# Patient Record
Sex: Female | Born: 1965 | Race: Black or African American | Hispanic: No | Marital: Married | Smoking: Never smoker
Health system: Southern US, Community
[De-identification: ages and names within clinical notes are randomized; demographics above are authoritative.]

## PROBLEM LIST (undated history)

## (undated) DIAGNOSIS — B373 Candidiasis of vulva and vagina: Secondary | ICD-10-CM

## (undated) DIAGNOSIS — O009 Unspecified ectopic pregnancy without intrauterine pregnancy: Secondary | ICD-10-CM

## (undated) DIAGNOSIS — O09529 Supervision of elderly multigravida, unspecified trimester: Secondary | ICD-10-CM

## (undated) DIAGNOSIS — E785 Hyperlipidemia, unspecified: Secondary | ICD-10-CM

## (undated) DIAGNOSIS — Z880 Allergy status to penicillin: Secondary | ICD-10-CM

## (undated) DIAGNOSIS — N921 Excessive and frequent menstruation with irregular cycle: Secondary | ICD-10-CM

## (undated) DIAGNOSIS — E119 Type 2 diabetes mellitus without complications: Secondary | ICD-10-CM

## (undated) DIAGNOSIS — I10 Essential (primary) hypertension: Secondary | ICD-10-CM

## (undated) DIAGNOSIS — N200 Calculus of kidney: Secondary | ICD-10-CM

## (undated) DIAGNOSIS — E669 Obesity, unspecified: Secondary | ICD-10-CM

## (undated) HISTORY — DX: Type 2 diabetes mellitus without complications: E11.9

## (undated) HISTORY — DX: Candidiasis of vulva and vagina: B37.3

## (undated) HISTORY — DX: Unspecified ectopic pregnancy without intrauterine pregnancy: O00.90

## (undated) HISTORY — DX: Obesity, unspecified: E66.9

## (undated) HISTORY — DX: Excessive and frequent menstruation with irregular cycle: N92.1

## (undated) HISTORY — DX: Calculus of kidney: N20.0

## (undated) HISTORY — DX: Supervision of elderly multigravida, unspecified trimester: O09.529

## (undated) HISTORY — DX: Allergy status to penicillin: Z88.0

## (undated) HISTORY — DX: Hyperlipidemia, unspecified: E78.5

---

## 2002-10-09 HISTORY — PX: TUBAL LIGATION: SHX77

## 2002-11-04 ENCOUNTER — Ambulatory Visit (HOSPITAL_COMMUNITY): Admission: RE | Admit: 2002-11-04 | Discharge: 2002-11-04 | Payer: Self-pay | Admitting: Obstetrics and Gynecology

## 2002-11-04 ENCOUNTER — Encounter (INDEPENDENT_AMBULATORY_CARE_PROVIDER_SITE_OTHER): Payer: Self-pay

## 2002-11-04 ENCOUNTER — Encounter: Payer: Self-pay | Admitting: Obstetrics and Gynecology

## 2002-11-04 DIAGNOSIS — Z880 Allergy status to penicillin: Secondary | ICD-10-CM

## 2002-11-04 HISTORY — DX: Allergy status to penicillin: Z88.0

## 2003-12-17 ENCOUNTER — Other Ambulatory Visit: Admission: RE | Admit: 2003-12-17 | Discharge: 2003-12-17 | Payer: Self-pay | Admitting: Obstetrics and Gynecology

## 2003-12-22 ENCOUNTER — Encounter: Admission: RE | Admit: 2003-12-22 | Discharge: 2003-12-22 | Payer: Self-pay | Admitting: Obstetrics and Gynecology

## 2004-07-14 ENCOUNTER — Ambulatory Visit (HOSPITAL_COMMUNITY): Admission: RE | Admit: 2004-07-14 | Discharge: 2004-07-14 | Payer: Self-pay | Admitting: Family Medicine

## 2005-06-29 ENCOUNTER — Other Ambulatory Visit: Admission: RE | Admit: 2005-06-29 | Discharge: 2005-06-29 | Payer: Self-pay | Admitting: Obstetrics and Gynecology

## 2005-07-14 ENCOUNTER — Encounter: Admission: RE | Admit: 2005-07-14 | Discharge: 2005-07-14 | Payer: Self-pay | Admitting: Obstetrics and Gynecology

## 2006-07-09 DIAGNOSIS — B3731 Acute candidiasis of vulva and vagina: Secondary | ICD-10-CM

## 2006-07-09 DIAGNOSIS — B373 Candidiasis of vulva and vagina: Secondary | ICD-10-CM

## 2006-07-09 HISTORY — DX: Candidiasis of vulva and vagina: B37.3

## 2006-07-09 HISTORY — DX: Acute candidiasis of vulva and vagina: B37.31

## 2006-10-11 ENCOUNTER — Inpatient Hospital Stay (HOSPITAL_COMMUNITY): Admission: AD | Admit: 2006-10-11 | Discharge: 2006-10-11 | Payer: Self-pay | Admitting: Obstetrics and Gynecology

## 2006-10-20 ENCOUNTER — Inpatient Hospital Stay (HOSPITAL_COMMUNITY): Admission: AD | Admit: 2006-10-20 | Discharge: 2006-10-20 | Payer: Self-pay | Admitting: Obstetrics and Gynecology

## 2006-11-13 ENCOUNTER — Inpatient Hospital Stay (HOSPITAL_COMMUNITY): Admission: AD | Admit: 2006-11-13 | Discharge: 2006-11-13 | Payer: Self-pay | Admitting: Obstetrics and Gynecology

## 2006-11-26 ENCOUNTER — Encounter: Admission: RE | Admit: 2006-11-26 | Discharge: 2006-12-23 | Payer: Self-pay | Admitting: Obstetrics and Gynecology

## 2006-11-28 ENCOUNTER — Inpatient Hospital Stay (HOSPITAL_COMMUNITY): Admission: AD | Admit: 2006-11-28 | Discharge: 2006-11-28 | Payer: Self-pay | Admitting: Obstetrics and Gynecology

## 2006-12-10 ENCOUNTER — Ambulatory Visit (HOSPITAL_COMMUNITY): Admission: RE | Admit: 2006-12-10 | Discharge: 2006-12-10 | Payer: Self-pay | Admitting: Obstetrics and Gynecology

## 2006-12-24 ENCOUNTER — Inpatient Hospital Stay (HOSPITAL_COMMUNITY): Admission: AD | Admit: 2006-12-24 | Discharge: 2006-12-27 | Payer: Self-pay | Admitting: Obstetrics and Gynecology

## 2006-12-24 ENCOUNTER — Encounter (INDEPENDENT_AMBULATORY_CARE_PROVIDER_SITE_OTHER): Payer: Self-pay | Admitting: *Deleted

## 2006-12-25 ENCOUNTER — Encounter (INDEPENDENT_AMBULATORY_CARE_PROVIDER_SITE_OTHER): Payer: Self-pay | Admitting: *Deleted

## 2006-12-31 ENCOUNTER — Encounter: Admission: RE | Admit: 2006-12-31 | Discharge: 2007-01-30 | Payer: Self-pay | Admitting: Obstetrics and Gynecology

## 2007-01-31 ENCOUNTER — Encounter: Admission: RE | Admit: 2007-01-31 | Discharge: 2007-03-01 | Payer: Self-pay | Admitting: Obstetrics and Gynecology

## 2007-03-02 ENCOUNTER — Encounter: Admission: RE | Admit: 2007-03-02 | Discharge: 2007-04-01 | Payer: Self-pay | Admitting: Obstetrics and Gynecology

## 2007-04-02 ENCOUNTER — Encounter: Admission: RE | Admit: 2007-04-02 | Discharge: 2007-05-01 | Payer: Self-pay | Admitting: Obstetrics and Gynecology

## 2007-05-02 ENCOUNTER — Encounter: Admission: RE | Admit: 2007-05-02 | Discharge: 2007-06-01 | Payer: Self-pay | Admitting: Obstetrics and Gynecology

## 2007-06-02 ENCOUNTER — Encounter: Admission: RE | Admit: 2007-06-02 | Discharge: 2007-07-02 | Payer: Self-pay | Admitting: Obstetrics and Gynecology

## 2007-06-07 ENCOUNTER — Encounter: Admission: RE | Admit: 2007-06-07 | Discharge: 2007-06-07 | Payer: Self-pay | Admitting: Obstetrics and Gynecology

## 2007-07-03 ENCOUNTER — Encounter: Admission: RE | Admit: 2007-07-03 | Discharge: 2007-08-01 | Payer: Self-pay | Admitting: Obstetrics and Gynecology

## 2007-08-02 ENCOUNTER — Encounter: Admission: RE | Admit: 2007-08-02 | Discharge: 2007-09-01 | Payer: Self-pay | Admitting: Obstetrics and Gynecology

## 2007-09-02 ENCOUNTER — Encounter: Admission: RE | Admit: 2007-09-02 | Discharge: 2007-10-01 | Payer: Self-pay | Admitting: Obstetrics and Gynecology

## 2007-10-02 ENCOUNTER — Encounter: Admission: RE | Admit: 2007-10-02 | Discharge: 2007-11-01 | Payer: Self-pay | Admitting: Obstetrics and Gynecology

## 2007-11-02 ENCOUNTER — Encounter: Admission: RE | Admit: 2007-11-02 | Discharge: 2007-12-02 | Payer: Self-pay | Admitting: Obstetrics and Gynecology

## 2007-11-02 IMAGING — US US AMNIOCENTESIS
1 series · 3 of 3 positions shown · non-contrast
Comparison: none

CLINICAL DATA: Assess fetal lung maturity.  
 ULTRASOUND-GUIDED AMNIOCENTESIS:
 Ultrasound was utilized to perform amniocentesis by the requesting physician.

[Series 1: us amniocentesis · 3 of 3 slices shown]
[im 1/3]
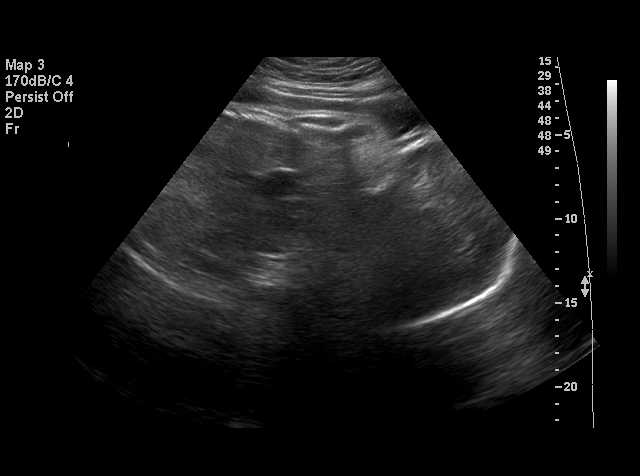
[im 2/3]
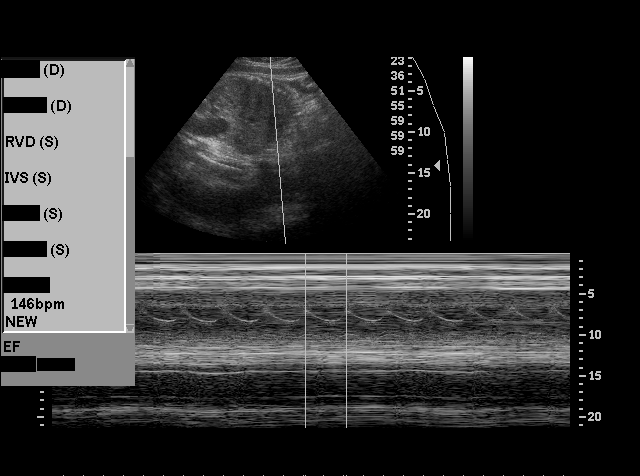
[im 3/3]
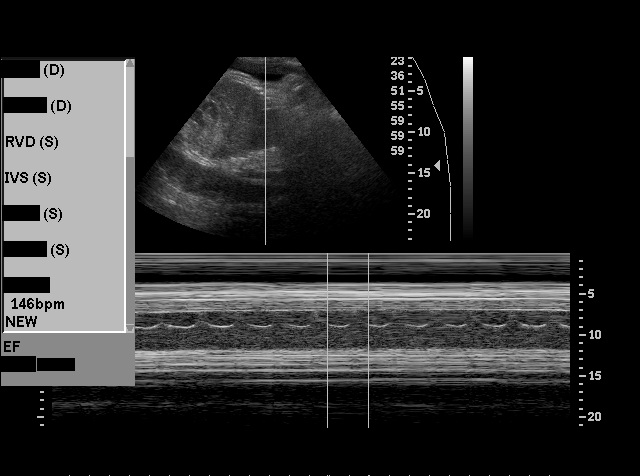

[3 of 3 positions shown; findings below may reference images not displayed]

IMPRESSION: No radiographic interpretation.

## 2007-11-02 IMAGING — US US FETAL BPP W/O NONSTRESS
1 series · 9 of 9 positions shown · non-contrast
Comparison: none

OBSTETRICAL ULTRASOUND:

 This ultrasound exam was performed in the [HOSPITAL] Ultrasound Department.  The OB US report was generated in the AS system, and faxed to the ordering physician.  This report is also available in [REDACTED] PACS.

[Series 1: us fetal bpp w/o nonstress · non-contrast · 9 of 9 slices shown]
[im 1/9]
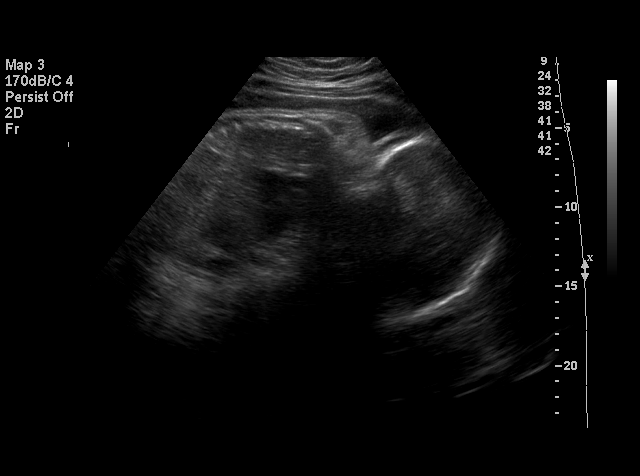
[im 2/9]
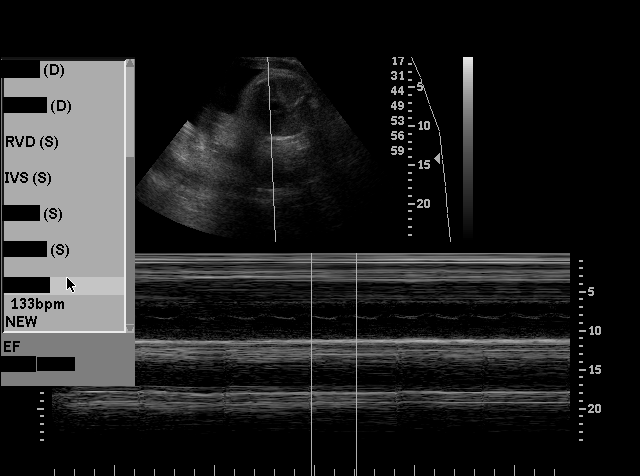
[im 3/9]
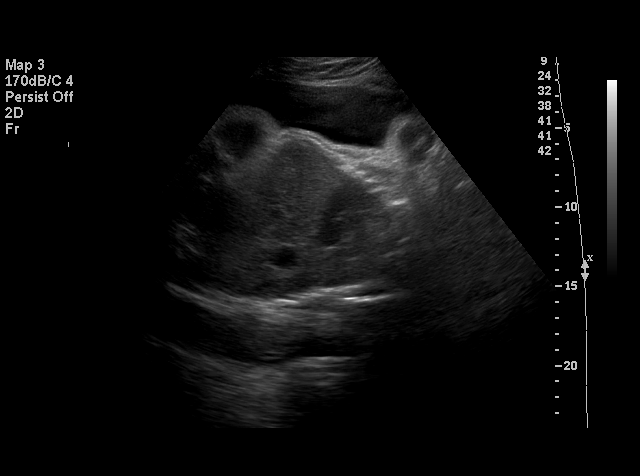
[im 4/9]
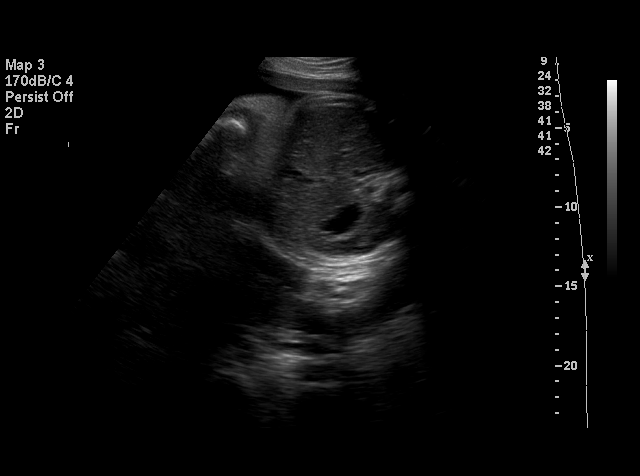
[im 5/9]
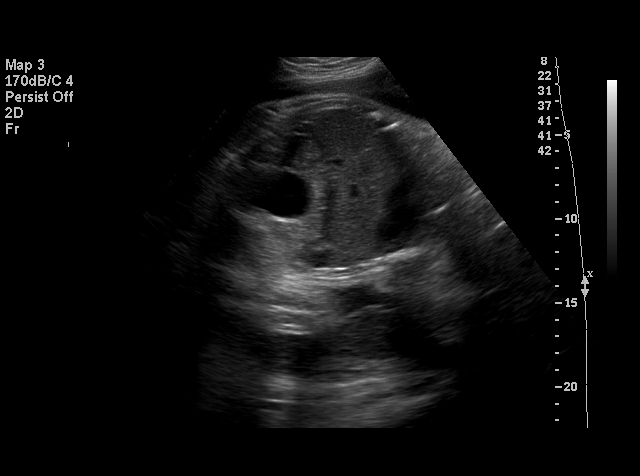
[im 6/9]
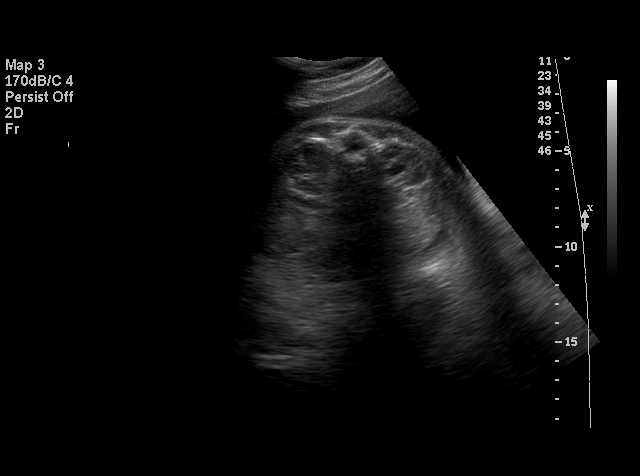
[im 7/9]
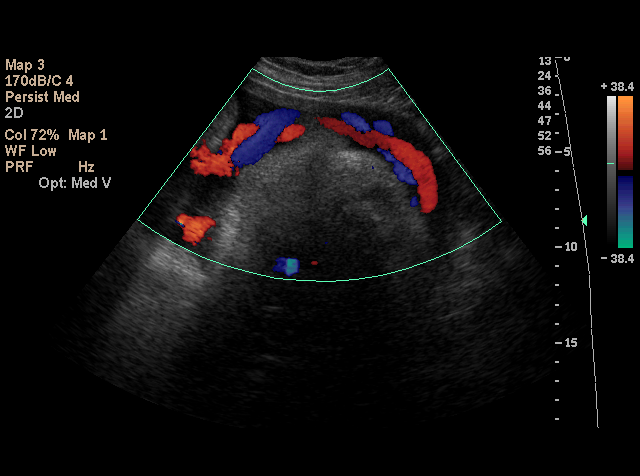
[im 8/9]
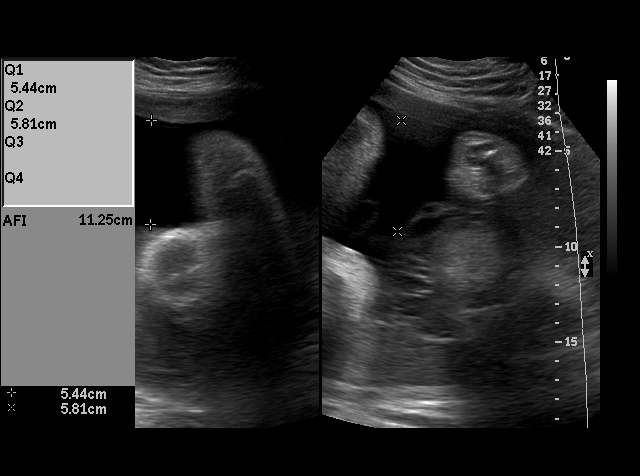
[im 9/9]
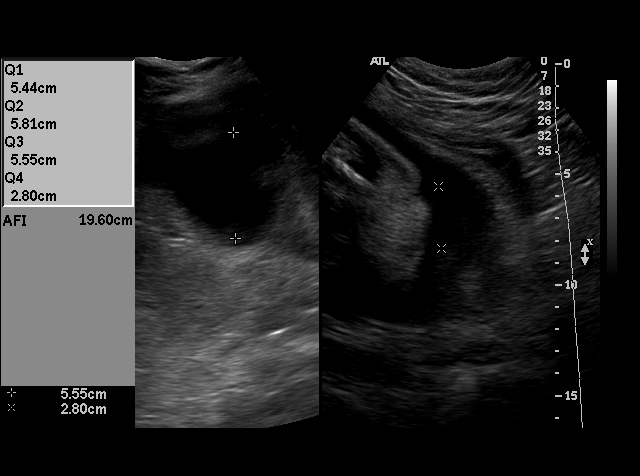

[9 of 9 positions shown; findings below may reference images not displayed]

IMPRESSION: See AS Obstetric US report.

## 2007-12-03 ENCOUNTER — Encounter: Admission: RE | Admit: 2007-12-03 | Discharge: 2007-12-30 | Payer: Self-pay | Admitting: Obstetrics and Gynecology

## 2007-12-31 ENCOUNTER — Encounter: Admission: RE | Admit: 2007-12-31 | Discharge: 2008-01-30 | Payer: Self-pay | Admitting: Obstetrics and Gynecology

## 2008-01-31 ENCOUNTER — Encounter: Admission: RE | Admit: 2008-01-31 | Discharge: 2008-02-29 | Payer: Self-pay | Admitting: Obstetrics and Gynecology

## 2008-03-01 ENCOUNTER — Encounter: Admission: RE | Admit: 2008-03-01 | Discharge: 2008-03-31 | Payer: Self-pay | Admitting: Obstetrics and Gynecology

## 2008-04-01 ENCOUNTER — Encounter: Admission: RE | Admit: 2008-04-01 | Discharge: 2008-04-30 | Payer: Self-pay | Admitting: Obstetrics and Gynecology

## 2008-05-01 ENCOUNTER — Encounter: Admission: RE | Admit: 2008-05-01 | Discharge: 2008-05-31 | Payer: Self-pay | Admitting: Obstetrics and Gynecology

## 2008-07-13 ENCOUNTER — Encounter: Admission: RE | Admit: 2008-07-13 | Discharge: 2008-07-13 | Payer: Self-pay | Admitting: Obstetrics and Gynecology

## 2008-10-09 LAB — CONVERTED CEMR LAB

## 2009-08-05 ENCOUNTER — Encounter: Admission: RE | Admit: 2009-08-05 | Discharge: 2009-08-05 | Payer: Self-pay | Admitting: Obstetrics and Gynecology

## 2009-08-05 LAB — HM MAMMOGRAPHY

## 2009-10-20 ENCOUNTER — Ambulatory Visit: Payer: Self-pay | Admitting: Internal Medicine

## 2009-10-20 DIAGNOSIS — R209 Unspecified disturbances of skin sensation: Secondary | ICD-10-CM | POA: Insufficient documentation

## 2009-10-26 ENCOUNTER — Ambulatory Visit: Payer: Self-pay | Admitting: Cardiology

## 2010-04-19 ENCOUNTER — Ambulatory Visit: Payer: Self-pay | Admitting: Internal Medicine

## 2010-08-02 ENCOUNTER — Ambulatory Visit: Payer: Self-pay | Admitting: Internal Medicine

## 2010-09-23 ENCOUNTER — Ambulatory Visit: Payer: Self-pay | Admitting: Internal Medicine

## 2010-09-27 LAB — CONVERTED CEMR LAB
ALT: 18 units/L (ref 0–35)
AST: 19 units/L (ref 0–37)
Albumin: 3.5 g/dL (ref 3.5–5.2)
Alkaline Phosphatase: 45 units/L (ref 39–117)
BUN: 9 mg/dL (ref 6–23)
Basophils Absolute: 0 10*3/uL (ref 0.0–0.1)
Basophils Relative: 0.4 % (ref 0.0–3.0)
Bilirubin Urine: NEGATIVE
Bilirubin, Direct: 0.1 mg/dL (ref 0.0–0.3)
CO2: 28 meq/L (ref 19–32)
Calcium: 8.8 mg/dL (ref 8.4–10.5)
Chloride: 108 meq/L (ref 96–112)
Cholesterol: 210 mg/dL — ABNORMAL HIGH (ref 0–200)
Creatinine, Ser: 0.9 mg/dL (ref 0.4–1.2)
Direct LDL: 138.3 mg/dL
Eosinophils Absolute: 0.1 10*3/uL (ref 0.0–0.7)
Eosinophils Relative: 1.9 % (ref 0.0–5.0)
GFR calc non Af Amer: 88.9 mL/min (ref >60.00)
Glucose, Bld: 87 mg/dL (ref 70–99)
HCT: 41.6 % (ref 36.0–46.0)
HDL: 52 mg/dL (ref >39.00)
Hemoglobin: 14 g/dL (ref 12.0–15.0)
Ketones, ur: NEGATIVE mg/dL
Leukocytes, UA: NEGATIVE
Lymphocytes Relative: 30.5 % (ref 12.0–46.0)
Lymphs Abs: 1.9 10*3/uL (ref 0.7–4.0)
MCHC: 33.5 g/dL (ref 30.0–36.0)
MCV: 89 fL (ref 78.0–100.0)
Monocytes Absolute: 0.6 10*3/uL (ref 0.1–1.0)
Monocytes Relative: 8.9 % (ref 3.0–12.0)
Neutro Abs: 3.6 10*3/uL (ref 1.4–7.7)
Neutrophils Relative %: 58.3 % (ref 43.0–77.0)
Nitrite: NEGATIVE
Platelets: 297 10*3/uL (ref 150.0–400.0)
Potassium: 4.8 meq/L (ref 3.5–5.1)
RBC: 4.68 M/uL (ref 3.87–5.11)
RDW: 13.9 % (ref 11.5–14.6)
Sodium: 140 meq/L (ref 135–145)
Specific Gravity, Urine: >=1.03 (ref 1.000–1.030)
TSH: 1.92 microintl units/mL (ref 0.35–5.50)
Total Bilirubin: 0.6 mg/dL (ref 0.3–1.2)
Total CHOL/HDL Ratio: 4
Total Protein, Urine: NEGATIVE mg/dL
Total Protein: 6.6 g/dL (ref 6.0–8.3)
Triglycerides: 43 mg/dL (ref 0.0–149.0)
Urine Glucose: NEGATIVE mg/dL
Urobilinogen, UA: 1 (ref 0.0–1.0)
VLDL: 8.6 mg/dL (ref 0.0–40.0)
WBC: 6.3 10*3/uL (ref 4.5–10.5)
pH: 6 (ref 5.0–8.0)

## 2010-09-28 ENCOUNTER — Encounter: Payer: Self-pay | Admitting: Internal Medicine

## 2010-09-28 DIAGNOSIS — E669 Obesity, unspecified: Secondary | ICD-10-CM | POA: Insufficient documentation

## 2010-09-30 ENCOUNTER — Ambulatory Visit: Payer: Self-pay | Admitting: Internal Medicine

## 2010-10-12 ENCOUNTER — Encounter
Admission: RE | Admit: 2010-10-12 | Discharge: 2010-10-12 | Payer: Self-pay | Source: Home / Self Care | Attending: Obstetrics and Gynecology | Admitting: Obstetrics and Gynecology

## 2010-10-30 ENCOUNTER — Encounter: Payer: Self-pay | Admitting: Obstetrics and Gynecology

## 2010-10-31 ENCOUNTER — Ambulatory Visit
Admission: RE | Admit: 2010-10-31 | Discharge: 2010-10-31 | Payer: Self-pay | Source: Home / Self Care | Attending: Internal Medicine | Admitting: Internal Medicine

## 2010-11-06 LAB — CONVERTED CEMR LAB
ALT: 19 units/L (ref 0–35)
AST: 19 units/L (ref 0–37)
Albumin: 3.5 g/dL (ref 3.5–5.2)
Alkaline Phosphatase: 55 units/L (ref 39–117)
BUN: 6 mg/dL (ref 6–23)
Basophils Absolute: 0.4 10*3/uL — ABNORMAL HIGH (ref 0.0–0.1)
Basophils Relative: 4 % — ABNORMAL HIGH (ref 0.0–3.0)
Bilirubin, Direct: 0.1 mg/dL (ref 0.0–0.3)
CO2: 27 meq/L (ref 19–32)
Calcium: 9.1 mg/dL (ref 8.4–10.5)
Chloride: 105 meq/L (ref 96–112)
Cholesterol: 217 mg/dL — ABNORMAL HIGH (ref 0–200)
Creatinine, Ser: 0.9 mg/dL (ref 0.4–1.2)
Direct LDL: 140.4 mg/dL
Eosinophils Absolute: 0.1 10*3/uL (ref 0.0–0.7)
Eosinophils Relative: 1.6 % (ref 0.0–5.0)
Folate: 16.3 ng/mL
GFR calc non Af Amer: 86.99 mL/min (ref >60)
Glucose, Bld: 109 mg/dL — ABNORMAL HIGH (ref 70–99)
HCT: 42.3 % (ref 36.0–46.0)
HDL: 56.1 mg/dL (ref >39.00)
Hemoglobin: 14.2 g/dL (ref 12.0–15.0)
Lymphocytes Relative: 24.3 % (ref 12.0–46.0)
Lymphs Abs: 2.2 10*3/uL (ref 0.7–4.0)
MCHC: 33.5 g/dL (ref 30.0–36.0)
MCV: 89.4 fL (ref 78.0–100.0)
Monocytes Absolute: 0.7 10*3/uL (ref 0.1–1.0)
Monocytes Relative: 7.2 % (ref 3.0–12.0)
Neutro Abs: 5.7 10*3/uL (ref 1.4–7.7)
Neutrophils Relative %: 62.9 % (ref 43.0–77.0)
Platelets: 239 10*3/uL (ref 150.0–400.0)
Potassium: 4.1 meq/L (ref 3.5–5.1)
RBC: 4.73 M/uL (ref 3.87–5.11)
RDW: 13.3 % (ref 11.5–14.6)
Sodium: 138 meq/L (ref 135–145)
TSH: 2.25 microintl units/mL (ref 0.35–5.50)
Total Bilirubin: 0.5 mg/dL (ref 0.3–1.2)
Total CHOL/HDL Ratio: 4
Total Protein: 7.1 g/dL (ref 6.0–8.3)
Triglycerides: 129 mg/dL (ref 0.0–149.0)
VLDL: 25.8 mg/dL (ref 0.0–40.0)
Vitamin B-12: 675 pg/mL (ref 211–911)
WBC: 9.1 10*3/uL (ref 4.5–10.5)

## 2010-11-10 NOTE — Assessment & Plan Note (Signed)
Summary: 1 MTH FU  STC   Vital Signs:  Patient profile:   45 year old female Height:      64 inches (162.56 cm) Weight:      220.50 pounds (100.23 kg) BMI:     37.99 O2 Sat:      98 % on Room air Temp:     97.9 degrees F (36.61 degrees C) oral Pulse rate:   69 / minute BP sitting:   102 / 78  (left arm) Cuff size:   large  Vitals Entered By: Brenton Grills CMA (AAMA) (October 31, 2010 10:30 AM)  O2 Flow:  Room air CC: 1 month F/U/aj Is Patient Diabetic? No   Primary Care Provider:  Rene Paci, MD  CC:  1 month F/U/aj.  History of Present Illness: here for f/u  obesity trial phentermine rx med for appetite suppression provided 09/28/10 working on inc exercise effort and diet changes -   Clinical Review Panels:  Lipid Management   Cholesterol:  210 (09/27/2010)   HDL (good cholesterol):  52.00 (09/27/2010)  CBC   WBC:  6.3 (09/27/2010)   RBC:  4.68 (09/27/2010)   Hgb:  14.0 (09/27/2010)   Hct:  41.6 (09/27/2010)   Platelets:  297.0 (09/27/2010)   MCV  89.0 (09/27/2010)   MCHC  33.5 (09/27/2010)   RDW  13.9 (09/27/2010)   PMN:  58.3 (09/27/2010)   Lymphs:  30.5 (09/27/2010)   Monos:  8.9 (09/27/2010)   Eosinophils:  1.9 (09/27/2010)   Basophil:  0.4 (09/27/2010)  Complete Metabolic Panel   Glucose:  87 (09/27/2010)   Sodium:  140 (09/27/2010)   Potassium:  4.8 (09/27/2010)   Chloride:  108 (09/27/2010)   CO2:  28 (09/27/2010)   BUN:  9 (09/27/2010)   Creatinine:  0.9 (09/27/2010)   Albumin:  3.5 (09/27/2010)   Total Protein:  6.6 (09/27/2010)   Calcium:  8.8 (09/27/2010)   Total Bili:  0.6 (09/27/2010)   Alk Phos:  45 (09/27/2010)   SGPT (ALT):  18 (09/27/2010)   SGOT (AST):  19 (09/27/2010)   Current Medications (verified): 1)  Phentermine Hcl 37.5 Mg Caps (Phentermine Hcl) .Marland Kitchen.. 1 By Mouth Once Daily  Allergies (verified): No Known Drug Allergies  Past History:  Past Medical History: Vit D defic obesity - previously followed with  bariatric clinic   MD roster - gyn - Su Hilt  Review of Systems  The patient denies chest pain, syncope, headaches, and abdominal pain.    Physical Exam  General:  alert, well-developed, well-nourished, and cooperative to examination.   overweight-appearing.   Lungs:  normal respiratory effort, no intercostal retractions or use of accessory muscles; normal breath sounds bilaterally - no crackles and no wheezes.    Heart:  normal rate, regular rhythm, no murmur, and no rub. BLE without edema. Psych:  Oriented X3, memory intact for recent and remote, normally interactive, good eye contact, not anxious appearing, not depressed appearing, and not agitated.      Impression & Recommendations:  Problem # 1:  OBESITY (ICD-278.00)  education of diet and exercsie for weight contol to improve health - will use phenteremine x 30d for appetite suppression - rx provided - 2 of 3 mo provided today - little loss in 1st 30d reviewed reweigh 1 mo and review med tolerance - max 3 mo use even if successful weight loss -  pt understands and agrees to same Ht: 64 (09/28/2010)   Wt: 221.12 (09/28/2010)   BMI: 38.09 (09/28/2010)  Ht: 64 (10/31/2010)   Wt: 220.50 (10/31/2010)   BMI: 37.99 (10/31/2010)  Complete Medication List: 1)  Phentermine Hcl 37.5 Mg Caps (Phentermine hcl) .Marland Kitchen.. 1 by mouth once daily  Patient Instructions: 1)  it was good to see you today. 2)  exam good and 09/2010 labs reviewed - thyroid looks normal 3)  ok to continue phentermine as discussed for appetitie suppression - will need recheck in 1 month before refills provided as discussed to ensure tolerance of medication and weight loss - call if problems 4)  2 of 3 months planned provided today, max 3 months treatment each year 5)  Please schedule a follow-up appointment in 1 month if planning to continue this medication, call sooner if problems. Prescriptions: PHENTERMINE HCL 37.5 MG CAPS (PHENTERMINE HCL) 1 by mouth once daily   #30 x 0   Entered and Authorized by:   Newt Lukes MD   Signed by:   Newt Lukes MD on 10/31/2010   Method used:   Print then Give to Patient   RxID:   [id number]    Orders Added: 1)  Est. Patient Level III [14782]

## 2010-11-10 NOTE — Assessment & Plan Note (Signed)
Summary: NEW [employer] PT--PKG--L LEG FEEL NUMB WHEN SIT DOWN-STC   Vital Signs:  Patient profile:   45 year old female Height:      64 inches (162.56 cm) Weight:      226.38 pounds (102.90 kg) BMI:     39.00 O2 Sat:      99 % on Room air Temp:     96.8 degrees F (36 degrees C) oral Pulse rate:   68 / minute BP sitting:   122 / 78  (left arm) Cuff size:   large  Vitals Entered By: Josph Macho CMA (October 20, 2009 2:01 PM)  O2 Flow:  Room air CC: New pt: Left leg numb when sitting/ TD today/ CF   Primary Care Provider:  Rene Paci, MD  CC:  New pt: Left leg numb when sitting/ TD today/ CF.  History of Present Illness: new pt to me, here to est care -  patient is here today for annual physical. Patient feels well.   also c/o L sided "pain" and numbness onset 1 month ago - located on lateral leg below knee as well as lateral side of left arm distal to elbow - no HA or vision changes - no muscle weakness or falls but +overwhelming fatigue and weight gain - numb symptoms worst when down on her knees (playing on floor with 47yr old) no difficulty with speech, no sz hx - no rash or itching symptoms are constantly present but wax/wane with intensity from mild to very noticable  Preventive Screening-Counseling & Management  Alcohol-Tobacco     Alcohol drinks/day: <1     Alcohol Counseling: not indicated; use of alcohol is not excessive or problematic     Smoking Status: never     Tobacco Counseling: not indicated; no tobacco use  Caffeine-Diet-Exercise     Diet Counseling: to improve diet; diet is suboptimal     Does Patient Exercise: no     Exercise Counseling: to improve exercise regimen     Depression Counseling: not indicated; screening negative for depression  Safety-Violence-Falls     Seat Belt Use: yes     Firearms in the Home: no firearms in the home     Smoke Detectors: yes     Violence in the Home: no risk noted     Fall Risk Counseling: not indicated;  no significant falls noted  Clinical Review Panels:  Prevention   Last Mammogram:  historical (08/05/2009)   Last Pap Smear:  historical (08/03/2009)  Immunizations   Last Tetanus Booster:  Tdap (10/20/2009)   Current Medications (verified): 1)  None  Allergies (verified): No Known Drug Allergies  Past History:  Past Medical History: Vit D defic obesity - previously followed with bariatric clinic  MD [facility] - gyn Su Hilt  Past Surgical History: s/p tubal 2004 d/t eptopic  Family History: mom - age 76 - HTN dad - age 17 - HTN sister - DM  Social History: divorced - lives with 3 childern , mom and g-mom Production designer, 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 I have reviewed all other systems and they were negative.   Physical Exam  General:  alert, well-developed, well-nourished, and cooperative to examination.    Eyes:  vision grossly intact; pupils equal, round and reactive to light.  conjunctiva and lids normal.    Ears:  normal pinnae bilaterally, without  erythema, swelling, or tenderness to palpation. TMs clear, without effusion, or cerumen impaction. Hearing grossly normal bilaterally  Mouth:  teeth and gums in good repair; mucous membranes moist, without lesions or ulcers. oropharynx clear without exudate, no erythema.  Lungs:  normal respiratory effort, no intercostal retractions or use of accessory muscles; normal breath sounds bilaterally - no crackles and no wheezes.    Heart:  normal rate, regular rhythm, no murmur, and no rub. BLE without edema. normal DP pulses and normal cap refill in all 4 extremities    Abdomen:  soft, non-tender, normal bowel sounds, no distention; no masses and no appreciable hepatomegaly or splenomegaly.   Genitalia:  defer to gyn Msk:  No deformity or scoliosis noted of thoracic or lumbar spine.  no effusions to knees, FROM at hip, kneee and ankle  BLE back: full range of motion of lumbar spine. Nontender to palpation. Negative straight leg raise. Deep tendon reflexes symmetrically intact at Achilles and patella, negative clonus. Sensation intact throughout all dermatomes in bilateral lower extremities. Full strength to manual muscle testing in all major muscule groups including EHL, anterior tibialis, gastrocnemius, quadriceps, and iliopsoas. Able to heel and toe walk without difficulty and ambulates with a normal gait.  Neurologic:  alert & oriented X3 and cranial nerves II-XII symetrically intact.  strength normal in all extremities, sensation intact to light touch, and gait normal. speech fluent without dysarthria or aphasia; follows commands with good comprehension.  Skin:  no rashes, vesicles, ulcers, or erythema. No nodules or irregularity to palpation.  Psych:  Oriented X3, memory intact for recent and remote, normally interactive, good eye contact, not anxious appearing, not depressed appearing, and not agitated.      Impression & Recommendations:  Problem # 1:  PREVENTIVE HEALTH CARE (ICD-V70.0)  Patient has been counseled on age-appropriate routine health concerns for screening and prevention. These are reviewed and up-to-date. Immunizations are up-to-date or declined. Labs ordered and ECG reviewed.   Orders: TLB-Lipid Panel (80061-LIPID) TLB-BMP (Basic Metabolic Panel-BMET) (80048-METABOL) TLB-CBC Platelet - w/Differential (85025-CBCD) TLB-Hepatic/Liver Function Pnl (80076-HEPATIC) TLB-TSH (Thyroid Stimulating Hormone) (84443-TSH) EKG w/ Interpretation (93000)  Problem # 2:  NUMBNESS (ICD-782.0)  likely cutaneous impingment -  r/o metabolic dz like Thyroid, DM or B12 deifc contrib also head ct r/o mass given unilateral symptoms on left -  consider MRI vs reassurance and observation depending on symptoms and test results  Orders: TLB-BMP (Basic Metabolic Panel-BMET) (80048-METABOL) TLB-TSH (Thyroid Stimulating Hormone)  (84443-TSH) TLB-B12 + Folate Pnl (16109_60454-U98/JXB) Misc. Referral (Misc. Ref)  Other Orders: Tdap => 61yrs IM (14782) Admin 1st Vaccine (95621)  Patient Instructions: 1)  it was good to see you today.  2)  test(s) ordered today - your results will be posted on the phone tree for review in 48-72 hours from the time of test completion; call [phone] and enter your 9 digit MRN (listed above on this page, just below your name); if any changes need to be made or there are abnormal results, you will be contacted directly.  3)  we'll make referral for Head ct scan. Our office will contact you regarding this appointment once made.  4)  it is important that you work on losing weight - monitor your diet and consume fewer calories such as less carbohydrates (sugar) and less fat. you also need to increase your physical activity level - start by walking for 10-20 minutes 3 times per week and work up to 30 minutes 4-5 times each week.  5)  Please  schedule a follow-up appointment in 6-12 months, sooner if problems.  6)  Take calcium +Vitamin D daily.  Preventive Care Screening  Mammogram:    Date:  08/05/2009    Results:  historical   Pap Smear:    Date:  08/03/2009    Results:  historical    Immunizations Administered:  Tetanus Vaccine:    Vaccine Type: Tdap    Site: left deltoid    Mfr: GlaxoSmithKline    Dose: 0.5 ml    Route: IM    Given by: Josph Macho CMA    Exp. Date: 12/04/2011    Lot #: [id number]    VIS given: 08/27/07 version given October 20, 2009.

## 2010-11-10 NOTE — Assessment & Plan Note (Signed)
Summary: [facility] / # / cd   Vital Signs:  Patient profile:   45 year old female Height:      64 inches (162.56 cm) Weight:      221.12 pounds (100.51 kg) BMI:     38.09 O2 Sat:      97 % on Room air Temp:     98.6 degrees F (37.00 degrees C) oral Pulse rate:   68 / minute BP sitting:   120 / 72  (left arm) Cuff size:   large  Vitals Entered By: Orlan Leavens RMA (September 28, 2010 2:10 PM)  O2 Flow:  Room air CC: CPX Is Patient Diabetic? No Pain Assessment Patient in pain? no        Primary Care Provider:  Rene Paci, MD  CC:  CPX.  History of Present Illness:   patient is here today for annual physical. Patient feels well today.   would like to try rx med for appetite suppression working on inc exercise effort and diet changes -   Preventive Screening-Counseling & Management  Alcohol-Tobacco     Alcohol drinks/day: <1     Alcohol Counseling: not indicated; use of alcohol is not excessive or problematic     Smoking Status: never     Tobacco Counseling: not indicated; no tobacco use  Caffeine-Diet-Exercise     Diet Counseling: to improve diet; diet is suboptimal     Does Patient Exercise: no     Exercise Counseling: to improve exercise regimen     Depression Counseling: not indicated; screening negative for depression  Safety-Violence-Falls     Seat Belt Use: yes     Firearms in the Home: no firearms in the home     Smoke Detectors: yes     Violence in the Home: no risk noted     Fall Risk Counseling: not indicated; no significant falls noted  Current Medications (verified): 1)  None  Allergies (verified): No Known Drug Allergies  Past History:  Past medical, surgical, family and social histories (including risk factors) reviewed, and no changes noted (except as noted below).  Past Medical History: Vit D defic obesity - previously followed with bariatric clinic  MD roster - gyn - Su Hilt  Past Surgical History: Reviewed history  from 10/20/2009 and no changes required. s/p tubal 2004 d/t eptopic  Family History: Reviewed history from 10/20/2009 and no changes required. mom - age 47 - HTN dad - age 44 - HTN sister - DM dtr - age 36y dx brain tumor JPA  Social History: Reviewed history from 10/20/2009 and no changes required. divorced - no alcohol or tobacco lives with 3 children , mom and g-mom Production designer, theatre/television/film at [employer]  Review of Systems       see HPI above. I have reviewed all other systems and they were negative.   Physical Exam  General:  alert, well-developed, well-nourished, and cooperative to examination.   overweight-appearing.   Head:  Normocephalic and atraumatic without obvious abnormalities. No apparent alopecia or balding. Eyes:  vision grossly intact; pupils equal, round and reactive to light.  conjunctiva and lids normal.    Ears:  normal pinnae bilaterally, without erythema, swelling, or tenderness to palpation. TMs clear, without effusion, or cerumen impaction. Hearing grossly normal bilaterally  Mouth:  teeth and gums in good repair; mucous membranes moist, without lesions or ulcers. oropharynx clear without exudate, no erythema.  Neck:  thick, supple, full ROM, no masses, no thyromegaly; no thyroid nodules or tenderness.  no JVD or carotid bruits.   Lungs:  normal respiratory effort, no intercostal retractions or use of accessory muscles; normal breath sounds bilaterally - no crackles and no wheezes.    Heart:  normal rate, regular rhythm, no murmur, and no rub. BLE without edema. normal DP pulses and normal cap refill in all 4 extremities    Abdomen:  soft, non-tender, normal bowel sounds, no distention; no masses and no appreciable hepatomegaly or splenomegaly.   Genitalia:  defer to gyn Msk:  No deformity or scoliosis noted of thoracic or lumbar spine.  no effusions to knees, FROM at hip, kneee and ankle BLE back: full range of motion of lumbar spine. Nontender to palpation. Negative  straight leg raise. Deep tendon reflexes symmetrically intact at Achilles and patella, negative clonus. Sensation intact throughout all dermatomes in bilateral lower extremities. Full strength to manual muscle testing in all major groups -Able to heel and toe walk without difficulty and ambulates with a normal gait.  Neurologic:  alert & oriented X3 and cranial nerves II-XII symetrically intact.  strength normal in all extremities, sensation intact to light touch, and gait normal. speech fluent without dysarthria or aphasia; follows commands with good comprehension.  Skin:  no rashes, vesicles, ulcers, or erythema. No nodules or irregularity to palpation.  Psych:  Oriented X3, memory intact for recent and remote, normally interactive, good eye contact, not anxious appearing, not depressed appearing, and not agitated.      Impression & Recommendations:  Problem # 1:  PREVENTIVE HEALTH CARE (ICD-V70.0)  Patient has been counseled on age-appropriate routine health concerns for screening and prevention. These are reviewed and up-to-date. Immunizations are up-to-date or declined. Labs and ECG reviewed.   Orders: EKG w/ Interpretation (93000)  Problem # 2:  OBESITY (ICD-278.00) education of diet and exercsie for weight contol to improve health - will use phenteremine x 30d for appetite suppression - rx provided - reweigh 1 mo and review med tolerance - max 3 mo use even if successful weight loss -  pt understands and agrees to same Ht: 64 (09/28/2010)   Wt: 221.12 (09/28/2010)   BMI: 38.09 (09/28/2010)  Complete Medication List: 1)  Phentermine Hcl 37.5 Mg Caps (Phentermine hcl) .Marland Kitchen.. 1 by mouth once daily  Patient Instructions: 1)  it was good to see you today. 2)  exam good and labs reviewed - looks good 3)  ok to start phentermine as discussed for appetitie suppression - will need recheck in 1 month before refills provided as discussed to ensure tolerance of medication and weight loss - call  if problems 4)  Please schedule a follow-up appointment in 1 month. Prescriptions: PHENTERMINE HCL 37.5 MG CAPS (PHENTERMINE HCL) 1 by mouth once daily  #30 x 0   Entered and Authorized by:   Newt Lukes MD   Signed by:   Newt Lukes MD on 09/28/2010   Method used:   Print then Give to Patient   RxID:   [id number]    Orders Added: 1)  Est. Patient 40-64 years [43329] 2)  EKG w/ Interpretation [93000] 3)  Est. Patient Level II [51884]     Pap Smear  Procedure date:  10/09/2008  Findings:      Interpretation Result:Negative for intraepithelial Lesion or Malignancy.

## 2010-11-30 ENCOUNTER — Ambulatory Visit: Payer: Self-pay | Admitting: Internal Medicine

## 2011-01-27 ENCOUNTER — Ambulatory Visit (INDEPENDENT_AMBULATORY_CARE_PROVIDER_SITE_OTHER): Payer: 59 | Admitting: Internal Medicine

## 2011-01-27 ENCOUNTER — Encounter: Payer: Self-pay | Admitting: Internal Medicine

## 2011-01-27 VITALS — BP 114/82 | HR 67 | Temp 97.9°F | Resp 16 | Wt 228.0 lb

## 2011-01-27 DIAGNOSIS — B353 Tinea pedis: Secondary | ICD-10-CM | POA: Insufficient documentation

## 2011-01-27 MED ORDER — KETOCONAZOLE 2 % EX CREA: TOPICAL_CREAM | Freq: Every day | CUTANEOUS | Status: DC

## 2011-01-27 NOTE — Progress Notes (Signed)
  Subjective:    Patient ID: April Cooper, female    DOB: 23-Feb-1966, 45 y.o.   MRN: [id number]  HPI She complains of an itchy rash between the toes on her left foot for several weeks.    Review of Systems  Skin: Positive for rash (left foot). Negative for color change, pallor and wound.  Hematological: Negative for adenopathy. Does not bruise/bleed easily.       Objective:   Physical Exam  Skin: Skin is warm, dry and intact. Rash (left foot, in the web spaces there is scale, maceration, and fissuring) noted. No petechiae and no purpura noted. Rash is not macular, not papular, not nodular, not pustular, not vesicular and not urticarial. She is not diaphoretic. No pallor.          Assessment & Plan:

## 2011-01-27 NOTE — Assessment & Plan Note (Signed)
Start ketoconazole cream 

## 2011-01-27 NOTE — Patient Instructions (Signed)
Tinea Pedis (Athlete's Foot) Your exam shows you have athlete's foot, a fungus infection that usually starts between the toes. This condition is made worse by heat, moisture, and friction. To treat it you should wash your feet 2-3 times daily, drying well especially between the toes. Wear cotton socks to absorb sweat and change them twice a day if possible. You should allow your feet to be open to the air as much as possible and wear sandals when possible. The first step in treatment is prevention:  Do not share towels.   Wear sandals or other foot protection in wet areas such as locker rooms, shared showers and public swimming pools.   Keep your feet dry. Wear shoes that allow air to circulate and use cotton or wool socks.  Many products are available to treat athlete's foot. Most are medications that kill fungus and are available as powders or creams. Some require a prescription, some do not. Your caregiver or pharmacist can make a suggestion. Do not use steroid creams on athlete's foot. SEEK MEDICAL CARE IF:  You develop a temperature with no other apparent cause.   You develop swelling and soreness & redness (inflammation) in your foot.   The infection is spreading. Your foot becomes swollen, hot and red (inflamed).   Treatment is not helping.   Athlete's foot is not better in 7 days or completely cured in 30 days.   You have any problems that may be related to the medicine you are taking.  Document Released: 11/02/2004 Document Re-Released: 09/13/2009 [employer] Patient Information 2011 [employer]. 

## 2011-02-23 ENCOUNTER — Encounter: Payer: Self-pay | Admitting: Internal Medicine

## 2011-02-24 NOTE — Discharge Summary (Signed)
NAMEMarland Kitchen  April Cooper, April Cooper               ACCOUNT NO.:  [id number]   MEDICAL RECORD NO.:  [id number]          PATIENT TYPE:  INP   LOCATION:  9133                          FACILITY:  [facility]   PHYSICIAN:  Janine Limbo, M.D.DATE OF BIRTH:  1965-10-31   DATE OF ADMISSION:  12/24/2006  DATE OF DISCHARGE:  12/27/2006                               DISCHARGE SUMMARY   ADMISSION DIAGNOSES:  1. Intrauterine pregnancy at 37-4/7 weeks.  2. Polyhydramnios.  3. Large for gestational age infant.  4. Glucose instability.  5. Positive group B strep.   DISCHARGE DIAGNOSES:  1. Intrauterine pregnancy at 37-4/7 weeks.  2. Polyhydramnios.  3. Large for gestational age infant.  4. Glucose instability.  5. Positive group B strep.  6. Variable decelerations in labor.  7. Status post spontaneous vaginal delivery of a female infant, Apgars 8      and 9, weighing 8 pounds 11 ounces.  8. Desires elective tubal ligation and status post bilateral tubal      ligation.   HOSPITAL PROCEDURES:  1. Epidural anesthesia.  2. Spontaneous vaginal delivery.  3. Elective postpartum bilateral tubal ligation.   HOSPITAL COURSE:  The patient was admitted for induction of labor  secondary to polyhydramnios and unstable glucoses.  Amniotomy was  performed with internal monitors placed.  She was placed on Pitocin.  She progressed steadily throughout the day but continued to have  variable decelerations throughout labor; amnioinfusion was started.  The  patient progressed to delivery with spontaneous vaginal delivery over  intact perineum of a viable female infant named Pennie Rushing, weighing 8 pounds 11  ounces, Apgars 8 and 9 with no complications.  Estimated blood loss was  250 mL over no lacerations.  She had an elective bilateral tubal  ligation several hours later under epidural anesthesia with no  complications.  On postpartum day #1, she was doing well.  She was  getting out of bed, breast-feeding well.  Hemoglobin was 9.7.   She was  given routine care.  On postpartum day #2, she was ready to go home.  She was breast-feeding well.  Pain was well-controlled.  Vital signs  were stable.  Chest was clear.  Heart rate regular rate and rhythm.  Abdomen was soft and appropriately tender.  Lochia was within normal  limits.  Incision clean, dry, and intact.  Extremities within normal  limits.  She was deemed to have received full benefit of her hospital  stay and was discharged home.   CONDITION ON DISCHARGE:  Good.   DISCHARGE LABS:  White blood cell count 12.9, hemoglobin 9.7, platelets  248.  RPR nonreactive.   DISCHARGE MEDICATIONS:  1. Motrin 600 mg p.o. q.6 h p.r.n.  2. Tylox 1-2 p.o. q.4 h p.r.n.   DISCHARGE INSTRUCTIONS:  Per [facility] handout.   DISCHARGE FOLLOWUP:  In 6 weeks or p.r.n.      Marie L. Williams, C.N.M.      Janine Limbo, M.D.  Electronically Signed    MLW/MEDQ  D:  12/27/2006  T:  12/27/2006  Job:  [id number]

## 2011-02-24 NOTE — H&P (Signed)
NAME:  DAISY, LITES NO.:  [id number]   MEDICAL RECORD NO.:  [id number]           PATIENT TYPE:   LOCATION:                                 FACILITY:   PHYSICIAN:  Janine Limbo, M.D.    DATE OF BIRTH:   DATE OF ADMISSION:  DATE OF DISCHARGE:                              HISTORY & PHYSICAL   [name] is a 45 year old gravida 4, para 2-0-1-2 at 92 and four-  sevenths weeks who presents for induction secondary to polyhydramnios.  Pregnancy has been remarkable for:  1. PENICILLIN allergy.  2. Advanced maternal age with the patient declining amniocentesis.  3. Rubella equivocal.  4. Polyhydramnios.  5. Glucose instability.  6. Positive group B strep.   PRENATAL LABORATORY DATA:  Blood type is O positive, Rh antibody  negative.  VDRL nonreactive.  Rubella titer is equivocal.  Hepatitis B  surface antigen negative.  Sickle cell test negative.  HIV nonreactive.  Cystic fibrosis testing negative.  GC and chlamydia cultures were  negative in September.  First trimester screen was normal.  Amniocentesis was declined.  The patient had a negative fetal  fibronectin at 24 weeks.  She had another negative fetal fibronectin at  29 weeks.  She had an elevated one-hour Glucola and had a negative three-  hour GTT.  Group B strep culture was positive at 36 weeks.   HISTORY OF PRESENT PREGNANCY:  The patient entered care at 10 weeks.  She declined amniocentesis.  She did have first-trimester screening that  was negative.  She had a discharge with odor at 16 weeks and was treated  for yeast.  By 18 weeks she had an ultrasound showing normal anatomy  except for right pyelectasis, normal fluid was noted, a posterior  placenta.  The patient did want a tubal ligation and risks and benefits  were discussed at that 18-week visit.  She was seen at 22 weeks with  some glycosuria, had a Dextrostix of 156.  Plan was made for Glucola at  24 weeks.  Fetal fibronectin had been done  and had been negative.  She  had a urine C&S done at approximately 24 weeks that was positive.  The  patient was treated with Macrobid.  Fetal fibronectin was done again at  27 weeks and was negative.  GC, chlamydia and group B strep cultures  were also done at that time.  She had a Glucola at 28 weeks that was  168.  Three-hour GTT was done that was normal.  Her hemoglobin at that  time was 11.2.  She had an ultrasound done at 29 weeks showing elevated  amniotic fluid volume at greater than the 97th percentile.  Weight was  at 3 pounds 7 ounces, also at the 97th percentile.  Fetus was breech.  Cervix was normal.  She was seen at 29 weeks for decreased fetal  movement.  Fetal monitor was reactive.  With the patient diagnosed with  polyhydramnios she began NST on Monday or Tuesday with repeat BPP with  AFI on Thursdays.  She was also placed on reduced work  time.  She was  evaluated again at 29 weeks with reactive NST.  She had a Dextrostix at  30 weeks of 139 secondary to 1+ glucosuria.  Three-hour GTT was normal.  She had another ultrasound at 37 weeks with AFI of 26.9, BPP was 8/8.  The patient was having some cramping.  She declined terbutaline subcu or  p.o.  Procardia was given as an option.  The patient did wish to proceed  with amniocentesis at approximately 36 weeks and if L/S ratio was  greater than 2 then would like to proceed with delivery.  At 32 weeks,  estimated fetal weight was at greater than the 90th percentile with AFI  greater than the 97th percentile.  The plan was made to check a fasting  and a 2-hour PC and was given diabetic counseling, although she never  fully had a diagnosis of gestational diabetes.  She had another BPP at  33 weeks, AFI of 25.98.  At 33 weeks, the AFI was 24.  Positive beta  strep was noted at 33 weeks.  Amniotic fluid volume at 34 weeks remained  at the 96th percentile.  At 36 weeks she had an amniocentesis showing  L/S ratio 2.2 which was a low  baseline.  Group B strep culture was  positive.  Induction was scheduled for the evening of December 24, 2006.  The rest of her pregnancy has been essentially uncomplicated since that  point.   OBSTETRICAL HISTORY:  In 1989 she had a vaginal birth of a female infant,  weight 7 pounds 8 ounces at 40 weeks.  She was in labor 18 hours.  She  had epidural anesthesia.  That child was born in [place].  In January  1998 she had a vaginal birth of a female infant, weight 8 pounds at 40  weeks.  She was in labor 9 hours.  She had no complications.  That child  was born at [facility].  In 2004 she had an ectopic pregnancy with  a new partner that required surgery.   MEDICAL HISTORY:  She had an IUD that was removed in April 2007.  She  reports the usual childhood illnesses.   SURGICAL HISTORY:  Includes her left fallopian tube was removed in 2004  with the previously noted ectopic.  Her only other hospitalization was  for childbirth.   She is allergic to PENICILLIN, which causes a rash.   FAMILY HISTORY:  Her mother and maternal grandmother have hypertension.  Her maternal aunt has insulin-dependent diabetes.  Her mother has TIAs  and her mother is also a smoker.   GENETIC HISTORY:  Remarkable for the patient's age of 14 and the  patient's father having two sets of twins.   SOCIAL HISTORY:  The patient is divorced.  The father of the baby is  currently involved.  His name is Julianne Handler.  The patient is college  educated.  She is a Investment banker, corporate.  Her partner is also college  educated.  He is a Emergency planning/management officer.  She has been followed by the  physician service at [facility].  She denies any alcohol, drug,  or tobacco use during this pregnancy.   PHYSICAL EXAMINATION:  VITAL SIGNS:  Stable, the patient is afebrile.  HEENT:  Within normal limits.  LUNGS:  Bilateral breath sounds are clear.  HEART:  Regular rate and rhythm without murmur. BREASTS:  Soft and nontender.   ABDOMEN:  Fundal height is approximately 40 cm.  Estimated  fetal weight  8 to 8-and-a-half pounds.  Uterine contractions have been irregular and  mild per patient report.  Fetal heart rate has been reactive on NST in  the office.  PELVIC:  Is not noted in the record at this time but will be performed  after the patient is evaluated in maternity admissions.  EXTREMITIES:  Deep tendon reflexes are 2+ without clonus.  There is a  trace edema.   IMPRESSION:  1. Intrauterine pregnancy at 37 and four-sevenths weeks.  2. Polyhydramnios.  3. Probable large-for-gestational-age infant.  4. Glucose instability.  5. Positive group B streptococcus.   PLAN:  1. Admit to birthing suite per consult with Dr. Marline Backbone as      attending physician and Dr. Osborn Coho as oncoming physician.  2. Routine physician orders.  3. Plan induction with Cervidil the evening of December 24, 2006, with      then Pitocin per low-dose protocol after Cervidil discontinued the      next morning.  4. Pain medication p.r.n.  5. CBGs q.4h. while Cervidil is in place and upon admission, then      q.2h. when in labor after rupture of membranes or the start of      Pitocin.  6. Clindamycin 900 mg IV q.8h. for group B strep prophylaxis secondary      to PENICILLIN sensitivity.      Renaldo Reel Emilee Hero, C.N.M.      Janine Limbo, M.D.  Electronically Signed    VLL/MEDQ  D:  12/24/2006  T:  12/24/2006  Job:  [id number]

## 2011-02-24 NOTE — Op Note (Signed)
NAME:  April Cooper, April Cooper               ACCOUNT NO.:  [id number]   MEDICAL RECORD NO.:  [id number]          PATIENT TYPE:  INP   LOCATION:  9133                          FACILITY:  [facility]   PHYSICIAN:  Osborn Coho, M.D.   DATE OF BIRTH:  09/22/1966   DATE OF PROCEDURE:  12/25/2006  DATE OF DISCHARGE:                               OPERATIVE REPORT   PREOPERATIVE DIAGNOSES:  1. Desires permanent sterilization.  2. Status post normal spontaneous vaginal delivery.  3. Status post left salpingectomy in 2004.   POSTOPERATIVE DIAGNOSES:  1. Desires permanent sterilization.  2. Status post normal spontaneous vaginal delivery.  3. Status post left salpingectomy in 2004.   PROCEDURE:  Right tubal ligation.   ANESTHESIA:  Epidural.   ATTENDING:  Dr. Osborn Coho.   FLUIDS:  1200 mL.   URINE OUTPUT:  Voided prior to procedure.   ESTIMATED BLOOD LOSS:  Minimal.   COMPLICATIONS:  None.   PROCEDURE:  The patient was taken to the operating room after risks and  alternatives were reviewed with the patient.  The patient verbalized  understanding, consent signed and witnessed.  The patient was given a  surgical level via the epidural and prepped and draped in normal sterile  fashion.  A 10 mm infraumbilical incision was made and carried down to  underlying layer of fascia with the scalpel and excised with the Mayo  scissors.  The peritoneum was entered bluntly and right tube identified  and carried out to its fimbriated end.  The laparotomy sponge was placed  intra-abdominally to move away the omentum, and right fallopian tube was  grasped in its midportion, ligated using 2-0 plain ties excised, and the  stump cauterized with the Bovie.  Good hemostasis was noted, and tube  was returned to the intra-abdominal cavity.  The patient had already had  a left salpingectomy noted and laparotomy sponge  removed.  Fascia was repaired with 0 Vicryl via a running stitch,  reapproximating the  subcutaneous tissues as well.  The skin was  approximated using 3-0 Monocryl via a subcuticular stitch.  Sponge, lap,  and needle count was correct.  The patient tolerated the procedure well  and was returned to recovery room in good condition.      Osborn Coho, M.D.  Electronically Signed     AR/MEDQ  D:  12/25/2006  T:  12/25/2006  Job:  [id number]

## 2011-03-03 ENCOUNTER — Ambulatory Visit: Payer: 59 | Admitting: Internal Medicine

## 2011-03-27 ENCOUNTER — Ambulatory Visit: Payer: 59 | Admitting: Internal Medicine

## 2011-03-27 DIAGNOSIS — Z0289 Encounter for other administrative examinations: Secondary | ICD-10-CM

## 2011-05-15 ENCOUNTER — Encounter: Payer: Self-pay | Admitting: Internal Medicine

## 2011-06-16 ENCOUNTER — Encounter: Payer: Self-pay | Admitting: Internal Medicine

## 2011-06-16 ENCOUNTER — Ambulatory Visit (INDEPENDENT_AMBULATORY_CARE_PROVIDER_SITE_OTHER): Payer: 59 | Admitting: Internal Medicine

## 2011-06-16 VITALS — BP 120/78 | HR 67 | Temp 98.1°F | Ht 62.0 in | Wt 231.8 lb

## 2011-06-16 DIAGNOSIS — G5603 Carpal tunnel syndrome, bilateral upper limbs: Secondary | ICD-10-CM

## 2011-06-16 DIAGNOSIS — G5600 Carpal tunnel syndrome, unspecified upper limb: Secondary | ICD-10-CM

## 2011-06-16 DIAGNOSIS — E669 Obesity, unspecified: Secondary | ICD-10-CM

## 2011-06-16 MED ORDER — PYRIDOXINE HCL 100 MG PO TABS: 100.0000 mg | ORAL_TABLET | Freq: Every day | ORAL | Status: DC

## 2011-06-16 MED ORDER — PHENTERMINE HCL 37.5 MG PO CAPS: 37.5000 mg | ORAL_CAPSULE | ORAL | Status: DC

## 2011-06-16 NOTE — Progress Notes (Signed)
  Subjective:    Patient ID: April Cooper, female    DOB: 02/12/1966, 45 y.o.   MRN: [id number]  HPI  complains of numbness Affects hands and upper ext, L>R symptoms occur during sleep>>awake  Improved with position change Also thumb and forearm ache while typing/writing during the day L>R side  Past Medical History  Diagnosis Date  . Tinea pedis 01/27/2011  . Vitamin D deficiency   . OBESITY      Review of Systems  Constitutional: Negative for fever.  Musculoskeletal: Negative for back pain and gait problem.  Neurological: Negative for dizziness, weakness and headaches.       Objective:   Physical Exam BP 120/78  Pulse 67  Temp(Src) 98.1 F (36.7 C) (Oral)  Ht 5\' 2"  (1.575 m)  Wt 231 lb 12.8 oz (105.144 kg)  BMI 42.40 kg/m2  SpO2 95% Wt Readings from Last 3 Encounters:  06/16/11 231 lb 12.8 oz (105.144 kg)  01/27/11 228 lb (103.42 kg)  10/31/10 220 lb 8 oz (100.018 kg)    Constitutional: She is overweight. She appears well-developed and well-nourished. No distress.   Neck: Normal range of motion. Neck supple. No JVD present. No thyromegaly present.  Cardiovascular: Normal rate, regular rhythm and normal heart sounds.  No murmur heard. No BLE edema. Pulmonary/Chest: Effort normal and breath sounds normal. No respiratory distress. She has no wheezes Musculoskeletal: Equal hand grip and interdigital strength -Normal range of motion, no joint effusions. No gross deformities Neurological: She is alert and oriented to person, place, and time. No cranial nerve deficit. Coordination and balance normal.  Skin: Skin is warm and dry. No rash noted. No erythema.  Psychiatric: She has a normal mood and affect. Her behavior is normal. Judgment and thought content normal.   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

## 2011-06-16 NOTE — Patient Instructions (Addendum)
It was good to see you today. we'll make referral for nerve conduction test to look for cause of hand numbness. Our office will contact you regarding appointment(s) once made. Will then call with other instructions once results reviewed In meanwhile, use wrist splints at night (sleep in them, remove in AM) and start B6 100mg  daily Refill on phentermine Carpal Tunnel Syndrome The carpal tunnel is a narrow hollow area in the wrist. It is formed by the wrist bones and ligaments. Nerves, blood vessels, and tendons (cord like structures which attach muscle to bone) on the palm side (the side of your hand in the direction your fingers bend) of your hand pass through the carpal tunnel. Repeated wrist motion or certain diseases may cause swelling within the tunnel. (That is why these are called repetitive trauma (damage caused by over use) disorders. It is also a common problem in late pregnancy.) This swelling pinches the main nerve in the wrist (median nerve) and causes the painful condition called carpal tunnel syndrome. A feeling of "pins and needles" may be noticed in the fingers or hand; however, the entire arm may ache from this condition. Carpal tunnel syndrome may clear up by itself. Cortisone injections may help. Sometimes, an operation may be needed to free the pinched nerve. An electromyogram (a type of test) may be needed to confirm this diagnosis (learning what is wrong). This is a test which measures nerve conduction. The nerve conduction is usually slowed in a carpal tunnel syndrome. HOME CARE INSTRUCTIONS  If your caregiver prescribed medication to help reduce swelling, take as directed.   If you were given a splint to keep your wrist from bending, use it as instructed. It is important to wear the splint at night. Use the splint for as long as you have pain or numbness in your hand, arm or wrist. This may take 1 to 2 months.   If you have pain at night, it may help to rub or shake your hand, or  elevate your hand above the level of your heart (the center of your chest).   It is important to give your wrist a rest by stopping the activities that are causing the problem. If your symptoms (problems) are work-related, you may need to talk to your employer about changing to a job that does not require using your wrist.   Only take over-the-counter or prescription medicines for pain, discomfort, or fever as directed by your caregiver.   Following periods of extended use, particularly strenuous use, apply an ice pack wrapped in a towel to the anterior (palm) side of the affected wrist for 20 to 30 minutes. Repeat as needed three to four times per day. This will help reduce the swelling.   Follow all instructions for follow-up with your caregiver. This includes any orthopedic referrals, physical therapy, and rehabilitation. Any delay in obtaining necessary care could result in a delay or failure of your condition to heal.  SEEK IMMEDIATE MEDICAL CARE IF:  You are still having pain and numbness following a week of treatment.   You develop new, unexplained symptoms.   Your current symptoms are getting worse and are not helped or controlled with medications.  MAKE SURE YOU:    Understand these instructions.   Will watch your condition.   Will get help right away if you are not doing well or get worse.  Document Released: 09/22/2000 Document Re-Released: 12/22/2008 [facility] Patient Information 2011 [place], [place].

## 2011-07-17 ENCOUNTER — Encounter: Payer: Self-pay | Admitting: Internal Medicine

## 2011-07-17 ENCOUNTER — Ambulatory Visit (INDEPENDENT_AMBULATORY_CARE_PROVIDER_SITE_OTHER): Payer: 59 | Admitting: Internal Medicine

## 2011-07-17 VITALS — BP 112/72 | HR 59 | Temp 98.8°F | Ht 62.0 in | Wt 231.8 lb

## 2011-07-17 DIAGNOSIS — G5603 Carpal tunnel syndrome, bilateral upper limbs: Secondary | ICD-10-CM

## 2011-07-17 DIAGNOSIS — M65849 Other synovitis and tenosynovitis, unspecified hand: Secondary | ICD-10-CM

## 2011-07-17 DIAGNOSIS — G5600 Carpal tunnel syndrome, unspecified upper limb: Secondary | ICD-10-CM

## 2011-07-17 DIAGNOSIS — M778 Other enthesopathies, not elsewhere classified: Secondary | ICD-10-CM

## 2011-07-17 DIAGNOSIS — M65839 Other synovitis and tenosynovitis, unspecified forearm: Secondary | ICD-10-CM

## 2011-07-17 MED ORDER — DICLOFENAC SODIUM 1 % TD GEL: 1.0000 "application " | Freq: Three times a day (TID) | TRANSDERMAL | Status: DC | PRN

## 2011-07-17 NOTE — Patient Instructions (Signed)
It was good to see you today. we'll follow up on referral for nerve conduction test to look for cause of hand numbness. Our office will contact you regarding appointment(s) once made. Will then call with other instructions once results reviewed In meanwhile, continue to use wrist splints at night (sleep in them, remove in AM) and B6 100mg  daily Also use Voltaren gel to right thumb tendon area as discussed for pain symptoms as needed -Your prescription(s) have been submitted to your pharmacy. Please take as directed and contact our office if you believe you are having problem(s) with the medication(s).

## 2011-07-17 NOTE — Progress Notes (Signed)
  Subjective:    Patient ID: April Cooper, female    DOB: [birth year], 45 y.o.   MRN: [id number]  HPI  Here for follow up - reviewed chronic medical issues:  ?CTS BUE - seen here for same 06/2011 Manifest with numbness in hands and upper ext, L>R symptoms occur during sleep>>awake  Improved with position change Also thumb and forearm ache while typing/writing during the day L>R side Improved with B6 and qhs splints - but still with right thumb pain Also ache in BUE with certain positions -  obesity - resumed 30d trial phentermine 06/2011 - used same 2011 with temporary success  Past Medical History  Diagnosis Date  . Vitamin D deficiency   . OBESITY     Review of Systems  Constitutional: Negative for fever.  Musculoskeletal: Negative for back pain and gait problem.  Neurological: Negative for dizziness, weakness and headaches.       Objective:   Physical Exam  BP 112/72  Pulse 59  Temp(Src) 98.8 F (37.1 C) (Oral)  Ht 5\' 2"  (1.575 m)  Wt 231 lb 12.8 oz (105.144 kg)  BMI 42.40 kg/m2  SpO2 96%  Wt Readings from Last 3 Encounters:  07/17/11 231 lb 12.8 oz (105.144 kg)  06/16/11 231 lb 12.8 oz (105.144 kg)  01/27/11 228 lb (103.42 kg)   Constitutional: She is overweight. She appears well-developed and well-nourished. No distress.   Cardiovascular: Normal rate, regular rhythm and normal heart sounds.  No murmur heard. No BLE edema. Pulmonary/Chest: Effort normal and breath sounds normal. No respiratory distress. She has no wheezes Musculoskeletal: Equal hand grip and interdigital strength -Normal range of motion, no joint effusions. No gross deformities - nontender to palpation but uncomfortable along pollicus longus R thumb with extension activity -  Skin: Skin is warm and dry. No rash noted. No erythema.  Psychiatric: She has a normal mood and affect. Her behavior is normal. Judgment and thought content normal.   Lab Results  Component Value Date   WBC 6.3  09/27/2010   HGB 14.0 09/27/2010   HCT 41.6 09/27/2010   PLT 297.0 09/27/2010   CHOL 210* 09/27/2010   TRIG 43.0 09/27/2010   HDL 52.00 09/27/2010   LDLDIRECT 138.3 09/27/2010   ALT 18 09/27/2010   AST 19 09/27/2010   NA 140 09/27/2010   K 4.8 09/27/2010   CL 108 09/27/2010   CREATININE 0.9 09/27/2010   BUN 9 09/27/2010   CO2 28 09/27/2010   TSH 1.92 09/27/2010       Assessment & Plan:  BUE CTS symptoms - L>R hand numbness, positional - improved with B6 and qhs wrist splint - but still need NCS to exclude cervical impingement given UE "ache" - will follow up on staus of order done 06/15/11  R thumb tendonitis - erx for Voltaren gel - explained separate issue from CTS issues -

## 2011-07-31 ENCOUNTER — Telehealth: Payer: Self-pay | Admitting: *Deleted

## 2011-07-31 NOTE — Telephone Encounter (Signed)
Md received results back for nerve conduction test. Called pt to inform no answer LMOM RTC....07/31/11@11 :57am/LMB

## 2011-08-01 NOTE — Telephone Encounter (Signed)
Called pt again still no answer LMOM RTC concerning nerve conduction..08/01/11@4 :44pm/LMB

## 2011-08-02 ENCOUNTER — Telehealth: Payer: Self-pay | Admitting: *Deleted

## 2011-08-02 NOTE — Telephone Encounter (Signed)
Patient return call back from yesterday gave results concerning nerve conduction test...08/02/11@4 :18pm/LMB

## 2011-08-04 ENCOUNTER — Encounter: Payer: Self-pay | Admitting: Internal Medicine

## 2011-10-10 HISTORY — PX: ABLATION: SHX5711

## 2011-12-01 ENCOUNTER — Ambulatory Visit: Payer: 59 | Admitting: Internal Medicine

## 2011-12-04 ENCOUNTER — Other Ambulatory Visit (INDEPENDENT_AMBULATORY_CARE_PROVIDER_SITE_OTHER): Payer: 59

## 2011-12-04 ENCOUNTER — Telehealth: Payer: Self-pay | Admitting: *Deleted

## 2011-12-04 ENCOUNTER — Encounter: Payer: Self-pay | Admitting: Internal Medicine

## 2011-12-04 ENCOUNTER — Ambulatory Visit (INDEPENDENT_AMBULATORY_CARE_PROVIDER_SITE_OTHER): Payer: 59 | Admitting: Internal Medicine

## 2011-12-04 VITALS — BP 120/78 | HR 58 | Temp 97.0°F | Ht 62.0 in | Wt 234.1 lb

## 2011-12-04 DIAGNOSIS — Z0000 Encounter for general adult medical examination without abnormal findings: Secondary | ICD-10-CM

## 2011-12-04 DIAGNOSIS — R51 Headache: Secondary | ICD-10-CM

## 2011-12-04 DIAGNOSIS — E669 Obesity, unspecified: Secondary | ICD-10-CM

## 2011-12-04 LAB — URINALYSIS
Bilirubin Urine: NEGATIVE
Hgb urine dipstick: NEGATIVE
Ketones, ur: NEGATIVE
Leukocytes, UA: NEGATIVE
Nitrite: NEGATIVE
Specific Gravity, Urine: 1.02 (ref 1.000–1.030)
Total Protein, Urine: NEGATIVE
Urine Glucose: NEGATIVE
Urobilinogen, UA: 0.2 (ref 0.0–1.0)
pH: 7.5 (ref 5.0–8.0)

## 2011-12-04 LAB — CBC WITH DIFFERENTIAL/PLATELET
Basophils Absolute: 0 10*3/uL (ref 0.0–0.1)
Basophils Relative: 0.3 % (ref 0.0–3.0)
Eosinophils Absolute: 0.1 10*3/uL (ref 0.0–0.7)
Eosinophils Relative: 1.2 % (ref 0.0–5.0)
HCT: 40.9 % (ref 36.0–46.0)
Hemoglobin: 13.5 g/dL (ref 12.0–15.0)
Lymphocytes Relative: 33.4 % (ref 12.0–46.0)
Lymphs Abs: 2.4 10*3/uL (ref 0.7–4.0)
MCHC: 33.1 g/dL (ref 30.0–36.0)
MCV: 88.4 fl (ref 78.0–100.0)
Monocytes Absolute: 0.7 10*3/uL (ref 0.1–1.0)
Monocytes Relative: 9.4 % (ref 3.0–12.0)
Neutro Abs: 3.9 10*3/uL (ref 1.4–7.7)
Neutrophils Relative %: 55.7 % (ref 43.0–77.0)
Platelets: 349 10*3/uL (ref 150.0–400.0)
RBC: 4.63 Mil/uL (ref 3.87–5.11)
RDW: 14.1 % (ref 11.5–14.6)
WBC: 7.1 10*3/uL (ref 4.5–10.5)

## 2011-12-04 LAB — BASIC METABOLIC PANEL
BUN: 10 mg/dL (ref 6–23)
CO2: 27 mEq/L (ref 19–32)
Calcium: 9.3 mg/dL (ref 8.4–10.5)
Chloride: 106 mEq/L (ref 96–112)
Creatinine, Ser: 0.8 mg/dL (ref 0.4–1.2)
GFR: 97.32 mL/min (ref >60.00)
Glucose, Bld: 88 mg/dL (ref 70–99)
Potassium: 4.8 mEq/L (ref 3.5–5.1)
Sodium: 139 mEq/L (ref 135–145)

## 2011-12-04 LAB — HEPATIC FUNCTION PANEL
ALT: 15 U/L (ref 0–35)
AST: 13 U/L (ref 0–37)
Albumin: 3.5 g/dL (ref 3.5–5.2)
Alkaline Phosphatase: 47 U/L (ref 39–117)
Bilirubin, Direct: 0 mg/dL (ref 0.0–0.3)
Total Bilirubin: 0.3 mg/dL (ref 0.3–1.2)
Total Protein: 6.8 g/dL (ref 6.0–8.3)

## 2011-12-04 LAB — LIPID PANEL
Cholesterol: 217 mg/dL — ABNORMAL HIGH (ref 0–200)
HDL: 64.7 mg/dL (ref >39.00)
Total CHOL/HDL Ratio: 3
Triglycerides: 104 mg/dL (ref 0.0–149.0)
VLDL: 20.8 mg/dL (ref 0.0–40.0)

## 2011-12-04 LAB — LDL CHOLESTEROL, DIRECT: Direct LDL: 143.2 mg/dL

## 2011-12-04 LAB — TSH: TSH: 2.05 u[IU]/mL (ref 0.35–5.50)

## 2011-12-04 NOTE — Telephone Encounter (Signed)
Message copied by Deatra James on Mon Dec 04, 2011 11:48 AM ------      Message from: COUSIN, SHARON T      Created: Mon Dec 04, 2011 11:20 AM      Regarding: 12/03/12----PHY DATE       Lynford Humphrey

## 2011-12-04 NOTE — Telephone Encounter (Signed)
Received staff msg pt made cpx for 12/03/12 need labs entered in EPIC... 12/04/11@11 :49am/LMB

## 2011-12-04 NOTE — Assessment & Plan Note (Signed)
Wt Readings from Last 3 Encounters:  12/04/11 234 lb 1.9 oz (106.196 kg)  07/17/11 231 lb 12.8 oz (105.144 kg)  06/16/11 231 lb 12.8 oz (105.144 kg)   I advised holding off on additional phentermine until headache symptoms have resolved Notes several recent diet changes in effort to lose weight prior to upcoming wedding, new engagement review

## 2011-12-04 NOTE — Patient Instructions (Signed)
It was good to see you today. Test(s) ordered today. Your results will be called to you after review (48-72hours after test completion). If any changes need to be made, you will be notified at that time. Schedule your mammogram and PAP smear with Dr. Su Hilt as discussed If recurrent headache symptoms, try Tylenol or Excedrin Migraine over-the-counter as discussed, and call here if symptoms worse or unimproved

## 2011-12-04 NOTE — Progress Notes (Signed)
Subjective:    Patient ID: April Cooper, female    DOB: 20-Apr-1966, 46 y.o.   MRN: [id number]  patient is here today for annual physical. Needs labs done  Patient feels well but notes a "severe" headache 4 days ago - see next - headache was associated with complete fatigue, gradually improved over the following 2 days and now minimal residual symptoms - declines taking over-the-counter medications, symptoms improved with bed rest  Headache  This is a new problem. The current episode started in the past 7 days. The problem occurs daily. The problem has been gradually improving. The pain does not radiate. Similar to Prior Headaches: no frequent headaches. The quality of the pain is described as aching. Pertinent negatives include no abdominal pain, anorexia, coughing, dizziness, eye redness, eye watering, fever, hearing loss, loss of balance, muscle aches, neck pain, phonophobia, scalp tenderness, seizures, sinus pressure, sore throat, swollen glands or visual change. 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 She has tried nothing for the symptoms. There is no history of recent head traumas or sinus disease.   Past Medical History  Diagnosis Date  . Vitamin d deficiency   . OBESITY    Family History  Problem Relation Age of Onset  . Hypertension Mother   . Hypertension Father   . Diabetes Sister    History  Substance Use Topics  . Smoking status: Never Smoker   . Smokeless tobacco: Not on file   Comment: Divorced-lives with 3 children, mom and G-mom. 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  . Alcohol Use: No    Review of Systems  Constitutional: Negative for fever.  HENT: Negative for hearing loss, sore throat, neck pain and sinus pressure.   Eyes: Negative for redness.  Respiratory: Negative for cough.   Gastrointestinal: Negative for abdominal pain and anorexia.  Neurological: Positive for headaches. Negative for dizziness, seizures and loss of balance.  No  other specific complaints in a complete review of systems (except as listed in HPI above).      Objective:   Physical Exam BP 120/78  Pulse 58  Temp(Src) 97 F (36.1 C) (Oral)  Ht 5\' 2"  (1.575 m)  Wt 234 lb 1.9 oz (106.196 kg)  BMI 42.82 kg/m2  SpO2 96% Wt Readings from Last 3 Encounters:  12/04/11 234 lb 1.9 oz (106.196 kg)  07/17/11 231 lb 12.8 oz (105.144 kg)  06/16/11 231 lb 12.8 oz (105.144 kg)   Constitutional: She is obese, but appears well-developed and well-nourished. No distress.  HENT: Head: Normocephalic and atraumatic. Ears: B TMs ok, no erythema or effusion; Nose: Nose normal. Mouth/Throat: Oropharynx is clear and moist. No oropharyngeal exudate.  Eyes: Conjunctivae and EOM are normal. Pupils are equal, round, and reactive to light. No scleral icterus.  Neck: Normal range of motion. Neck supple. No JVD or LAD present. No thyromegaly present.  Cardiovascular: Normal rate, regular rhythm and normal heart sounds.  No murmur heard. No BLE edema. Pulmonary/Chest: Effort normal and breath sounds normal. No respiratory distress. She has no wheezes.  Abdominal: Soft. Bowel sounds are normal. She exhibits no distension. There is no tenderness. no masses Musculoskeletal: Normal range of motion, no joint effusions. No gross deformities Neurological: She is alert and oriented to person, place, and time. No cranial nerve deficit. Coordination normal.  speech, cognition and gait normal. 5 out of 5 strength in all extremities, no sensory deficits Skin: Skin is warm and dry. No rash noted. No erythema.  Psychiatric: She  has a normal mood and affect. Her behavior is normal. Judgment and thought content normal.   Lab Results  Component Value Date   WBC 6.3 09/27/2010   HGB 14.0 09/27/2010   HCT 41.6 09/27/2010   PLT 297.0 09/27/2010   GLUCOSE 87 09/27/2010   CHOL 210* 09/27/2010   TRIG 43.0 09/27/2010   HDL 52.00 09/27/2010   LDLDIRECT 138.3 09/27/2010   ALT 18 09/27/2010   AST  19 09/27/2010   NA 140 09/27/2010   K 4.8 09/27/2010   CL 108 09/27/2010   CREATININE 0.9 09/27/2010   BUN 9 09/27/2010   CO2 28 09/27/2010   TSH 1.92 09/27/2010       Assessment & Plan:  CPX/v70.0 - Patient has been counseled on age-appropriate routine health concerns for screening and prevention. These are reviewed and up-to-date. Immunizations are up-to-date or declined. Labs ordered and will be reviewed.   Headaches - suspect tension related to multiple emotional stressors (work, new engagement, coronal changes such as reduce caffeine and new diet) also weather change and seasonal affective overlap - neuro exam benign indirect legs and history. Advised over-the-counter Tylenol or Excedrin Migraine as needed, patient to call if symptoms worse or unimproved after trying over-the-counter therapies

## 2011-12-20 ENCOUNTER — Ambulatory Visit (INDEPENDENT_AMBULATORY_CARE_PROVIDER_SITE_OTHER): Payer: 59 | Admitting: Obstetrics and Gynecology

## 2011-12-20 DIAGNOSIS — N920 Excessive and frequent menstruation with regular cycle: Secondary | ICD-10-CM

## 2011-12-20 DIAGNOSIS — N951 Menopausal and female climacteric states: Secondary | ICD-10-CM

## 2011-12-20 DIAGNOSIS — Z01419 Encounter for gynecological examination (general) (routine) without abnormal findings: Secondary | ICD-10-CM

## 2011-12-20 DIAGNOSIS — Z139 Encounter for screening, unspecified: Secondary | ICD-10-CM

## 2012-01-03 ENCOUNTER — Encounter (INDEPENDENT_AMBULATORY_CARE_PROVIDER_SITE_OTHER): Payer: 59 | Admitting: Obstetrics and Gynecology

## 2012-01-03 ENCOUNTER — Encounter (INDEPENDENT_AMBULATORY_CARE_PROVIDER_SITE_OTHER): Payer: 59

## 2012-01-03 DIAGNOSIS — N920 Excessive and frequent menstruation with regular cycle: Secondary | ICD-10-CM

## 2012-01-31 ENCOUNTER — Telehealth: Payer: Self-pay | Admitting: Obstetrics and Gynecology

## 2012-03-01 ENCOUNTER — Other Ambulatory Visit: Payer: Self-pay | Admitting: Obstetrics and Gynecology

## 2012-03-01 MED ORDER — IBUPROFEN 800 MG PO TABS: 800.0000 mg | ORAL_TABLET | Freq: Three times a day (TID) | ORAL | Status: AC | PRN

## 2012-03-01 MED ORDER — PROMETHAZINE HCL 25 MG PO TABS: 25.0000 mg | ORAL_TABLET | Freq: Four times a day (QID) | ORAL | Status: DC | PRN

## 2012-03-01 MED ORDER — HYDROCODONE-ACETAMINOPHEN 5-500 MG PO TABS: 1.0000 | ORAL_TABLET | Freq: Four times a day (QID) | ORAL | Status: AC | PRN

## 2012-03-01 MED ORDER — DIAZEPAM 10 MG PO TABS: 10.0000 mg | ORAL_TABLET | Freq: Once | ORAL | Status: AC

## 2012-03-01 NOTE — Progress Notes (Signed)
The hysteroscope should be soaked in a tub of Cidex for at least 20 minutes before using on the patient.  The patient is instructed to start taking Motrin 48 hours prior to the procedure.  However, none the morning of the procedure.  The patient needs to arrive to the office one and a half hours prior to the procedure.  The consent form needs to be reviewed with the patient and the patient informed of what to expect after the procedure.  The patient needs to sign the consent form and then instructed to take the medications that she brought in.  4 Prescriptions  Valium #1 (10 mg) Vicodin #21 (5/500) Phenergan #14 (25 mg) Motrin #30 (800 mg)  The patient needs to take 1 Valium, 1 Vicodin, and 1 Phenergan.  The patient will also need to be given a 60 mg injection of Toradol.  (If the patient is having Novasure as well, cytotec may or may not be given per the office Novasure protocol but if it can be avoided, that may be best when hysteropscopy is also being performed.  It may be easier to maintain the pressure needed to complete the hysterscopy, if the cervix is not too dilated).  Vital signs need to be done upon arrival, 10 to 15 minutes after patient takes medications and 15 and 30 minutes after the procedure.  The patient needs to eat when she gets home, and take 1 Vicodin and 1 Motrin 6 hours after the Toradol dose she received in the office and continue the Motrin every 8 hours for 3 doses.  If there is any breakthrough pain, take Vicodin as prescribed as well.  During the procedure a paracervical block is administered using 15 cc of 1% lidocaine mixed with 5 cc of 1% lidocaine with epinephrine (or per MD preference).

## 2012-03-07 ENCOUNTER — Telehealth: Payer: Self-pay | Admitting: Obstetrics and Gynecology

## 2012-03-07 NOTE — Telephone Encounter (Signed)
IN OFFICE Hysteroscopy/Ablation scheduled for 03/19/12 @ 12:00 with AR. Patient instructed to arrive at 11:00.  Precertification with [facility] [phone] per Sam. Covered at 100% with a $20 copayment if an Office visit is billed. - Adrianne Pridgen

## 2012-03-19 ENCOUNTER — Ambulatory Visit (INDEPENDENT_AMBULATORY_CARE_PROVIDER_SITE_OTHER): Payer: 59 | Admitting: Obstetrics and Gynecology

## 2012-03-19 ENCOUNTER — Encounter: Payer: Self-pay | Admitting: Obstetrics and Gynecology

## 2012-03-19 ENCOUNTER — Ambulatory Visit (INDEPENDENT_AMBULATORY_CARE_PROVIDER_SITE_OTHER): Payer: 59

## 2012-03-19 VITALS — BP 130/82 | HR 62 | Temp 98.6°F | Resp 14

## 2012-03-19 VITALS — BP 126/84 | HR 80 | Temp 98.6°F | Resp 16

## 2012-03-19 DIAGNOSIS — N920 Excessive and frequent menstruation with regular cycle: Secondary | ICD-10-CM

## 2012-03-19 DIAGNOSIS — IMO0002 Reserved for concepts with insufficient information to code with codable children: Secondary | ICD-10-CM

## 2012-03-19 LAB — POCT URINE PREGNANCY
Preg Test, Ur: NEGATIVE
Preg Test, Ur: NEGATIVE

## 2012-03-19 MED ORDER — KETOROLAC TROMETHAMINE 60 MG/2ML IM SOLN: 60.0000 mg | Freq: Once | INTRAMUSCULAR | Status: DC

## 2012-03-19 MED ORDER — KETOROLAC TROMETHAMINE 60 MG/2ML IM SOLN
60.0000 mg | Freq: Once | INTRAMUSCULAR | Status: AC
  Administered 2012-03-19: 60 mg via INTRAMUSCULAR

## 2012-03-19 NOTE — Progress Notes (Unsigned)
Vitals taken @ 11:50am Medication brought by pt Phenergan 25mg  po,vicodin5/500mg ,valium 10mg  taken@11 :50am Toradol 60mg  IM given @ 12 noon [email or web address]

## 2012-03-19 NOTE — Progress Notes (Addendum)
Pt had vitalsigns @11 :50amupt neg Medications brought to office given @11 :50a Phenergan 25mg  po,valium 10mg  po and vicodin 5/500 1 po Toradol 60mg  IM given @ 12noon Vitals @12 :30pm 126/84 resp 14 and opulse 80 Post procedure vitals@1 :30pm 130/82,p 62 and resp 14 200pm 130/80 p 60 and resp 16  Pt d/c @2 :15p  01/04/12 Em Bx neg  HYSTEROSCOPY WITH NOVASURE ENDOMETRIAL ABLATION PROCEDURE    Patient's Name: April Cooper  Date of Birth: 11-10-1965  Date of Procedure: 03/19/2012     Preoperative Diagnosis: menorrhagia  Postoperative Diagnosis: menorrhagia and same  Name of Operation/Procedure: 1.Hysteroscopy 2.Novasure Endometrial Ablation  Estimated Hysteroscopic Fluid Deficit: 200  EBL: Minimal   JWJ:[id number] prior to procedure  Findings: No intracavitary lesions.  Uterus sounded to 9cm, Cervical length 4cm, Cavity length 5cm, Cavity Width 4.8cm, Power 132 watts, Time 61 secs  Procedure: The patient was placed in the dorsal lithotomy position.  She was sterilely prepped and draped.  A Grave's speculum was placed in the vaginal vault.  The anterior lip of the cervix was grasped with a single tooth tenaculum.  Using Lidocaine:Lidocaine with Epinephrine 15ml at a ratio of 3:1, a paracervical block was placed in the sacrouterine pillars as we near the right and left parametrial areas.  Additionally, injection sites were placed circumferentially around the ectocervix: approximately 5 ml was used.  A uterine dilator to 8 [place] was then used to dilated the internal cervical os.  The Slimline Hysteroscope was introduced with NS as the distention media.  The findings were no intracavitary lesions.   Sharp curettage was not performed.  If sharp curettage was performed, the specimen was sent to pathology accordingly.  The uterus was sounded to 10 cm.  The uterine cavity length measured 5 cm and set on the Novasure handpiece.  The device was then inserted transcervically and the cavity width  measured 4.8 cm as indicated by the cavity width dial on the Novasure handpiece.  The Cavity Integrity Assessment safety feature was initiated and passed.  The delivery of Radio Frequency energy was delivered for a total of 61 seconds of treatment time at a power of 132 watts.  The device was retracted and safely removed from the cavity.  Hysteroscopy was performed again and good ablation results were noted.  The instruments were removed.  Sponge and needle counts were correct at completion of the procedure.  The patient was recovered and dismissed later.  She tolerated the procedure well.  Purcell Nails

## 2012-03-19 NOTE — Progress Notes (Signed)
01/04/12 - EmBx neg

## 2012-03-25 ENCOUNTER — Other Ambulatory Visit: Payer: Self-pay | Admitting: Obstetrics and Gynecology

## 2012-03-25 DIAGNOSIS — Z1231 Encounter for screening mammogram for malignant neoplasm of breast: Secondary | ICD-10-CM

## 2012-04-04 ENCOUNTER — Encounter: Payer: Self-pay | Admitting: Internal Medicine

## 2012-04-04 ENCOUNTER — Ambulatory Visit (INDEPENDENT_AMBULATORY_CARE_PROVIDER_SITE_OTHER): Payer: 59 | Admitting: Internal Medicine

## 2012-04-04 VITALS — BP 118/82 | HR 63 | Temp 98.3°F | Resp 16 | Wt 236.2 lb

## 2012-04-04 DIAGNOSIS — E669 Obesity, unspecified: Secondary | ICD-10-CM

## 2012-04-04 DIAGNOSIS — E559 Vitamin D deficiency, unspecified: Secondary | ICD-10-CM | POA: Insufficient documentation

## 2012-04-04 DIAGNOSIS — N920 Excessive and frequent menstruation with regular cycle: Secondary | ICD-10-CM

## 2012-04-04 MED ORDER — PHENTERMINE HCL 37.5 MG PO CAPS: 37.5000 mg | ORAL_CAPSULE | ORAL | Status: DC

## 2012-04-04 NOTE — Progress Notes (Signed)
  Subjective:    Patient ID: April Cooper, female    DOB: May 14, 1966, 46 y.o.   MRN: [id number]  HPI  Here for follow up   Past Medical History  Diagnosis Date  . Vitamin d deficiency   . OBESITY   . Penicillin allergy 11/04/2002  . AMA (advanced maternal age) multigravida 35+   . Vulvovaginal candidiasis 07/2006  . Menometrorrhagia 01/2004, 12/2011    Review of Systems  Constitutional: Positive for fatigue. Negative for fever.  Respiratory: Negative for cough and shortness of breath.   Neurological: Negative for light-headedness and headaches.       Objective:   Physical Exam BP 118/82  Pulse 63  Temp 98.3 F (36.8 C) (Oral)  Resp 16  Wt 236 lb 4 oz (107.162 kg)  SpO2 99%  LMP 03/12/2012 Wt Readings from Last 3 Encounters:  04/04/12 236 lb 4 oz (107.162 kg)  12/04/11 234 lb 1.9 oz (106.196 kg)  07/17/11 231 lb 12.8 oz (105.144 kg)   Constitutional: She is overweight, but appears well-developed and well-nourished. No distress.  Neck: Normal range of motion. Neck supple. No JVD present. No thyromegaly present.  Cardiovascular: Normal rate, regular rhythm and normal heart sounds.  No murmur heard. No BLE edema. Pulmonary/Chest: Effort normal and breath sounds normal. No respiratory distress. She has no wheezes.  Psychiatric: She has a normal mood and affect. Her behavior is normal. Judgment and thought content normal.    Lab Results  Component Value Date   WBC 7.1 12/04/2011   HGB 13.5 12/04/2011   HCT 40.9 12/04/2011   PLT 349.0 12/04/2011   GLUCOSE 88 12/04/2011   CHOL 217* 12/04/2011   TRIG 104.0 12/04/2011   HDL 64.70 12/04/2011   LDLDIRECT 143.2 12/04/2011   ALT 15 12/04/2011   AST 13 12/04/2011   NA 139 12/04/2011   K 4.8 12/04/2011   CL 106 12/04/2011   CREATININE 0.8 12/04/2011   BUN 10 12/04/2011   CO2 27 12/04/2011   TSH 2.05 12/04/2011      Assessment & Plan:

## 2012-04-04 NOTE — Assessment & Plan Note (Signed)
Wt Readings from Last 3 Encounters:  04/04/12 236 lb 4 oz (107.162 kg)  12/04/11 234 lb 1.9 oz (106.196 kg)  07/17/11 231 lb 12.8 oz (105.144 kg)   11/2011 we advised holding off on additional phentermine until headache symptoms had resolved Will resume tx now - also discussed other potential options for med tx (belviq/Qysmia) Also ongoing diet changes in effort to lose weight prior to upcoming wedding

## 2012-04-04 NOTE — Assessment & Plan Note (Signed)
S/p endometria ablation 03/2012 by gyn for same

## 2012-04-04 NOTE — Patient Instructions (Addendum)
It was good to see you today. We have reviewed your recent records including surgery, labs and tests today Medications reviewed and updated, no changes at this time. Refill on medication(s) as discussed today. Consider Qysmia or Belviq in future for help with appetite suppression and weight loss IF needed Continue to work on lifestyle changes as discussed (low fat, low carb, increased protein diet; improved exercise efforts; weight loss) to control sugar, blood pressure and cholesterol levels and/or reduce risk of developing other medical problems. Look into weight watchers, [email or web address] or other type of food journal to assist you in this process.

## 2012-04-04 NOTE — Assessment & Plan Note (Signed)
Reviewed dx and ongoing tx as by gyn

## 2012-04-08 ENCOUNTER — Ambulatory Visit (INDEPENDENT_AMBULATORY_CARE_PROVIDER_SITE_OTHER): Payer: 59 | Admitting: Obstetrics and Gynecology

## 2012-04-08 ENCOUNTER — Encounter: Payer: Self-pay | Admitting: Obstetrics and Gynecology

## 2012-04-08 VITALS — BP 120/74 | Temp 98.1°F | Wt 238.0 lb

## 2012-04-08 DIAGNOSIS — Z9889 Other specified postprocedural states: Secondary | ICD-10-CM | POA: Insufficient documentation

## 2012-04-08 DIAGNOSIS — N920 Excessive and frequent menstruation with regular cycle: Secondary | ICD-10-CM

## 2012-04-08 DIAGNOSIS — N390 Urinary tract infection, site not specified: Secondary | ICD-10-CM

## 2012-04-08 NOTE — Progress Notes (Signed)
DATE OF SURGERY: 03/19/12 TYPE OF SURGERY:Hysteroscopy/ablation PAIN:No VAG BLEEDING: no VAG DISCHARGE: no NORMAL GI FUNCTN: yes NORMAL GU FUNCTN: no Pt states she feels like her bladder is full but when she urinates there is a small amount of urine.   No other complaints.  Denies dysuria  Filed Vitals:   04/08/12 1407  BP: 120/74  Temp: 98.1 F (36.7 C)   ROS: noncontributory  Pelvic exam:  VULVA: normal appearing vulva with no masses, tenderness or lesions,  VAGINA: normal appearing vagina with normal color and discharge, no lesions, no abnl discharge CERVIX: normal appearing cervix without discharge or lesions,  UTERUS: uterus is normal size, shape, consistency and nontender,  ADNEXA: normal adnexa in size, nontender and no masses.  A/P Doing well s/p in office hysteroscopy D&C CC Urine to Cx Increase H2O and cranberry juice in the meantime 01/2013 for AEX

## 2012-04-08 NOTE — Addendum Note (Signed)
Addended by: Marla Roe A on: 04/08/2012 05:16 PM   Modules accepted: Orders

## 2012-04-10 LAB — URINE CULTURE
Colony Count: NO GROWTH
Organism ID, Bacteria: NO GROWTH

## 2012-04-22 ENCOUNTER — Ambulatory Visit
Admission: RE | Admit: 2012-04-22 | Discharge: 2012-04-22 | Disposition: A | Discharge status: Home / Self Care | Payer: 59 | Source: Ambulatory Visit | Attending: Obstetrics and Gynecology | Admitting: Obstetrics and Gynecology

## 2012-04-22 DIAGNOSIS — Z1231 Encounter for screening mammogram for malignant neoplasm of breast: Secondary | ICD-10-CM

## 2012-05-02 ENCOUNTER — Encounter: Payer: Self-pay | Admitting: Internal Medicine

## 2012-05-02 ENCOUNTER — Ambulatory Visit (INDEPENDENT_AMBULATORY_CARE_PROVIDER_SITE_OTHER): Payer: 59 | Admitting: Internal Medicine

## 2012-05-02 VITALS — BP 132/82 | HR 70 | Temp 97.7°F | Ht 62.0 in | Wt 229.4 lb

## 2012-05-02 DIAGNOSIS — E669 Obesity, unspecified: Secondary | ICD-10-CM

## 2012-05-02 DIAGNOSIS — J309 Allergic rhinitis, unspecified: Secondary | ICD-10-CM

## 2012-05-02 DIAGNOSIS — H9390 Unspecified disorder of ear, unspecified ear: Secondary | ICD-10-CM

## 2012-05-02 DIAGNOSIS — H9391 Unspecified disorder of right ear: Secondary | ICD-10-CM

## 2012-05-02 DIAGNOSIS — H6990 Unspecified Eustachian tube disorder, unspecified ear: Secondary | ICD-10-CM

## 2012-05-02 MED ORDER — LORATADINE 10 MG PO TABS: 10.0000 mg | ORAL_TABLET | Freq: Every day | ORAL | Status: DC

## 2012-05-02 MED ORDER — PHENTERMINE HCL 37.5 MG PO CAPS: 37.5000 mg | ORAL_CAPSULE | ORAL | Status: DC

## 2012-05-02 NOTE — Patient Instructions (Signed)
It was good to see you today. Medications reviewed and updated Start claritin daily for next 7 days to help with your right ear symptoms and sleep with right ear up in bed if possible Refill on weight loss medication(s) as discussed today. 2nd of 3 consecutive months phentermine provided today Consider Qysmia or Belviq in future for help with appetite suppression and weight loss IF needed Continue to work on lifestyle changes as discussed (low fat, low carb, increased protein diet; improved exercise efforts; weight loss) to control sugar, blood pressure and cholesterol levels and/or reduce risk of developing other medical problems. Look into weight watchers, [email or web address] or other type of food journal to assist you in this process. Please schedule followup in 6 months with me, 1 months for nurse visit and weight check prior to 3rd phentermine refill - call sooner if problems.

## 2012-05-02 NOTE — Assessment & Plan Note (Signed)
Wt Readings from Last 3 Encounters:  05/02/12 229 lb 6.4 oz (104.055 kg)  04/08/12 238 lb (107.956 kg)  04/04/12 236 lb 4 oz (107.162 kg)   11/2011 we advised holding off on additional phentermine until headache symptoms had resolved Began phentermine 03/2012 - will provide 2/3 consecutive months today again discussed other potential options for med tx (belviq/Qysmia) Also reviewed ongoing diet/exercise changes in effort to lose weight prior to upcoming wedding

## 2012-05-02 NOTE — Progress Notes (Signed)
  Subjective:    Patient ID: April Cooper, female    DOB: 1966/05/15, 46 y.o.   MRN: [id number]  HPI   Here for follow up   Past Medical History  Diagnosis Date  . Vitamin d deficiency   . OBESITY   . Penicillin allergy 11/04/2002  . AMA (advanced maternal age) multigravida 35+   . Vulvovaginal candidiasis 07/2006  . Menometrorrhagia 01/2004, 12/2011    Review of Systems  Constitutional: Negative for fever and fatigue.  HENT: Positive for ear pain (right, mild x 1 week). Negative for ear discharge.   Respiratory: Negative for cough, chest tightness and shortness of breath.   Cardiovascular: Negative for chest pain and palpitations.  Neurological: Negative for light-headedness and headaches.  Psychiatric/Behavioral: The patient is not nervous/anxious.        Objective:   Physical Exam  BP 132/82  Pulse 70  Temp 97.7 F (36.5 C) (Oral)  Ht 5\' 2"  (1.575 m)  Wt 229 lb 6.4 oz (104.055 kg)  BMI 41.96 kg/m2  SpO2 98% Wt Readings from Last 3 Encounters:  05/02/12 229 lb 6.4 oz (104.055 kg)  04/08/12 238 lb (107.956 kg)  04/04/12 236 lb 4 oz (107.162 kg)   Constitutional: She is overweight, but appears well-developed and well-nourished. No distress.  HENT: B TMs clear - no effusion or cerumen - non tender; OP clear without erythema, trace cobblestoning and clear PND Neck: Normal range of motion. Neck supple. No JVD present. No thyromegaly present.  Cardiovascular: Normal rate, regular rhythm and normal heart sounds.  No murmur heard. No BLE edema. Pulmonary/Chest: Effort normal and breath sounds normal. No respiratory distress. She has no wheezes.  Psychiatric: She has a normal mood and affect. Her behavior is normal. Judgment and thought content normal.    Lab Results  Component Value Date   WBC 7.1 12/04/2011   HGB 13.5 12/04/2011   HCT 40.9 12/04/2011   PLT 349.0 12/04/2011   GLUCOSE 88 12/04/2011   CHOL 217* 12/04/2011   TRIG 104.0 12/04/2011   HDL 64.70 12/04/2011   LDLDIRECT 143.2 12/04/2011   ALT 15 12/04/2011   AST 13 12/04/2011   NA 139 12/04/2011   K 4.8 12/04/2011   CL 106 12/04/2011   CREATININE 0.8 12/04/2011   BUN 10 12/04/2011   CO2 27 12/04/2011   TSH 2.05 12/04/2011      Assessment & Plan:  See problem list. Medications and labs reviewed today.  R ear fullness - exam benign - suspect mild eustachian tube dysfx -  recommended antihistamine and repositioning - "bad ear up" during sleep

## 2012-06-04 ENCOUNTER — Ambulatory Visit: Payer: 59 | Admitting: General Practice

## 2012-06-04 VITALS — Wt 224.5 lb

## 2012-06-04 DIAGNOSIS — E669 Obesity, unspecified: Secondary | ICD-10-CM

## 2012-06-04 MED ORDER — PHENTERMINE HCL 37.5 MG PO CAPS: 37.5000 mg | ORAL_CAPSULE | ORAL | Status: DC

## 2012-08-06 ENCOUNTER — Ambulatory Visit: Payer: 59 | Admitting: Internal Medicine

## 2012-08-14 ENCOUNTER — Ambulatory Visit (INDEPENDENT_AMBULATORY_CARE_PROVIDER_SITE_OTHER): Payer: 59 | Admitting: Internal Medicine

## 2012-08-14 ENCOUNTER — Encounter: Payer: Self-pay | Admitting: Internal Medicine

## 2012-08-14 VITALS — BP 132/80 | HR 68 | Temp 98.4°F | Ht 62.0 in | Wt 224.4 lb

## 2012-08-14 DIAGNOSIS — E669 Obesity, unspecified: Secondary | ICD-10-CM

## 2012-08-14 MED ORDER — PHENTERMINE HCL 37.5 MG PO CAPS: 37.5000 mg | ORAL_CAPSULE | ORAL | Status: DC

## 2012-08-14 NOTE — Patient Instructions (Addendum)
It was good to see you today. Medications reviewed and updated Restart weight loss medication(s) as discussed today. 1st of 3 consecutive months phentermine provided today Consider Qysmia or Belviq in future for help with appetite suppression and weight loss IF needed Continue to work on lifestyle changes as discussed (low fat, low carb, increased protein diet; improved exercise efforts; weight loss) to control sugar, blood pressure and cholesterol levels and/or reduce risk of developing other medical problems. Look into weight watchers, [email or web address] or other type of food journal to assist you in this process. Please keep schedule February appointment with me, 1 month for nurse visit and weight check prior to 2nd and 3rd phentermine refill - call sooner if problems.

## 2012-08-14 NOTE — Assessment & Plan Note (Signed)
Wt Readings from Last 3 Encounters:  08/14/12 224 lb 6.4 oz (101.787 kg)  06/04/12 224 lb 8 oz (101.833 kg)  05/02/12 229 lb 6.4 oz (104.055 kg)   11/2011 we advised holding off on additional phentermine until headache symptoms had resolved Began phentermine 03/2012 -x3 mo - then break and weight has maintained Wishes to resume for help with downward trend -restart 3/3 consecutive months today again discussed other potential options for med tx (belviq/Qysmia) Also reviewed ongoing diet/exercise changes in effort to lose weight

## 2012-08-14 NOTE — Progress Notes (Signed)
  Subjective:    Patient ID: April Cooper, female    DOB: 08/18/1966, 46 y.o.   MRN: [id number]  HPI  Here for follow up   Past Medical History  Diagnosis Date  . Vitamin D deficiency   . OBESITY   . Penicillin allergy 11/04/2002  . AMA (advanced maternal age) multigravida 35+   . Vulvovaginal candidiasis 07/2006  . Menometrorrhagia 01/2004, 12/2011    Review of Systems  Constitutional: Negative for fever and fatigue.  Respiratory: Negative for cough, chest tightness and shortness of breath.   Cardiovascular: Negative for chest pain and palpitations.  Neurological: Negative for light-headedness and headaches.  Psychiatric/Behavioral: The patient is not nervous/anxious.        Objective:   Physical Exam  BP 132/80  Pulse 68  Temp 98.4 F (36.9 C) (Oral)  Ht 5\' 2"  (1.575 m)  Wt 224 lb 6.4 oz (101.787 kg)  BMI 41.04 kg/m2  SpO2 97% Wt Readings from Last 3 Encounters:  08/14/12 224 lb 6.4 oz (101.787 kg)  06/04/12 224 lb 8 oz (101.833 kg)  05/02/12 229 lb 6.4 oz (104.055 kg)   Constitutional: She is overweight, but appears well-developed and well-nourished. No distress.  Neck: Normal range of motion. Neck supple. No JVD present. No thyromegaly present.  Cardiovascular: Normal rate, regular rhythm and normal heart sounds.  No murmur heard. No BLE edema. Pulmonary/Chest: Effort normal and breath sounds normal. No respiratory distress. She has no wheezes.  Psychiatric: She has a normal mood and affect. Her behavior is normal. Judgment and thought content normal.    Lab Results  Component Value Date   WBC 7.1 12/04/2011   HGB 13.5 12/04/2011   HCT 40.9 12/04/2011   PLT 349.0 12/04/2011   GLUCOSE 88 12/04/2011   CHOL 217* 12/04/2011   TRIG 104.0 12/04/2011   HDL 64.70 12/04/2011   LDLDIRECT 143.2 12/04/2011   ALT 15 12/04/2011   AST 13 12/04/2011   NA 139 12/04/2011   K 4.8 12/04/2011   CL 106 12/04/2011   CREATININE 0.8 12/04/2011   BUN 10 12/04/2011   CO2 27 12/04/2011   TSH 2.05 12/04/2011      Assessment & Plan:  See problem list. Medications and labs reviewed today.

## 2012-09-16 ENCOUNTER — Ambulatory Visit (INDEPENDENT_AMBULATORY_CARE_PROVIDER_SITE_OTHER): Payer: 59

## 2012-09-16 VITALS — Wt 225.0 lb

## 2012-09-16 DIAGNOSIS — E669 Obesity, unspecified: Secondary | ICD-10-CM

## 2012-09-16 MED ORDER — PHENTERMINE HCL 37.5 MG PO CAPS: 37.5000 mg | ORAL_CAPSULE | ORAL | Status: DC

## 2012-09-16 NOTE — Addendum Note (Signed)
Addended by: Deatra James on: 09/16/2012 09:21 AM   Modules accepted: Orders

## 2012-11-13 ENCOUNTER — Encounter: Payer: Self-pay | Admitting: Internal Medicine

## 2012-11-13 ENCOUNTER — Ambulatory Visit (INDEPENDENT_AMBULATORY_CARE_PROVIDER_SITE_OTHER): Payer: 59 | Admitting: Internal Medicine

## 2012-11-13 ENCOUNTER — Ambulatory Visit: Payer: 59 | Admitting: Internal Medicine

## 2012-11-13 ENCOUNTER — Other Ambulatory Visit (INDEPENDENT_AMBULATORY_CARE_PROVIDER_SITE_OTHER): Payer: 59

## 2012-11-13 VITALS — BP 118/72 | HR 71 | Temp 98.2°F | Wt 225.8 lb

## 2012-11-13 DIAGNOSIS — R1013 Epigastric pain: Secondary | ICD-10-CM

## 2012-11-13 DIAGNOSIS — R5381 Other malaise: Secondary | ICD-10-CM

## 2012-11-13 DIAGNOSIS — M549 Dorsalgia, unspecified: Secondary | ICD-10-CM

## 2012-11-13 DIAGNOSIS — K529 Noninfective gastroenteritis and colitis, unspecified: Secondary | ICD-10-CM

## 2012-11-13 DIAGNOSIS — R5383 Other fatigue: Secondary | ICD-10-CM

## 2012-11-13 DIAGNOSIS — K5289 Other specified noninfective gastroenteritis and colitis: Secondary | ICD-10-CM

## 2012-11-13 LAB — CBC WITH DIFFERENTIAL/PLATELET
Basophils Absolute: 0 10*3/uL (ref 0.0–0.1)
Basophils Relative: 0.2 % (ref 0.0–3.0)
Eosinophils Absolute: 0 10*3/uL (ref 0.0–0.7)
Eosinophils Relative: 0.4 % (ref 0.0–5.0)
HCT: 43.1 % (ref 36.0–46.0)
Hemoglobin: 14.4 g/dL (ref 12.0–15.0)
Lymphocytes Relative: 14.1 % (ref 12.0–46.0)
Lymphs Abs: 1 10*3/uL (ref 0.7–4.0)
MCHC: 33.5 g/dL (ref 30.0–36.0)
MCV: 86.4 fl (ref 78.0–100.0)
Monocytes Absolute: 1 10*3/uL (ref 0.1–1.0)
Monocytes Relative: 13.4 % — ABNORMAL HIGH (ref 3.0–12.0)
Neutro Abs: 5.2 10*3/uL (ref 1.4–7.7)
Neutrophils Relative %: 71.9 % (ref 43.0–77.0)
Platelets: 294 10*3/uL (ref 150.0–400.0)
RBC: 4.99 Mil/uL (ref 3.87–5.11)
RDW: 14.8 % — ABNORMAL HIGH (ref 11.5–14.6)
WBC: 7.2 10*3/uL (ref 4.5–10.5)

## 2012-11-13 LAB — URINALYSIS, ROUTINE W REFLEX MICROSCOPIC
Bilirubin Urine: NEGATIVE
Hgb urine dipstick: NEGATIVE
Ketones, ur: NEGATIVE
Leukocytes, UA: NEGATIVE
Nitrite: NEGATIVE
Specific Gravity, Urine: 1.025 (ref 1.000–1.030)
Urine Glucose: NEGATIVE
Urobilinogen, UA: 1 (ref 0.0–1.0)
pH: 6 (ref 5.0–8.0)

## 2012-11-13 LAB — BASIC METABOLIC PANEL
BUN: 11 mg/dL (ref 6–23)
CO2: 29 mEq/L (ref 19–32)
Calcium: 8.4 mg/dL (ref 8.4–10.5)
Chloride: 104 mEq/L (ref 96–112)
Creatinine, Ser: 0.7 mg/dL (ref 0.4–1.2)
GFR: 107.59 mL/min (ref >60.00)
Glucose, Bld: 98 mg/dL (ref 70–99)
Potassium: 3.6 mEq/L (ref 3.5–5.1)
Sodium: 139 mEq/L (ref 135–145)

## 2012-11-13 LAB — TSH: TSH: 2.15 u[IU]/mL (ref 0.35–5.50)

## 2012-11-13 LAB — HEPATIC FUNCTION PANEL
ALT: 23 U/L (ref 0–35)
AST: 19 U/L (ref 0–37)
Albumin: 3.5 g/dL (ref 3.5–5.2)
Alkaline Phosphatase: 44 U/L (ref 39–117)
Bilirubin, Direct: 0.1 mg/dL (ref 0.0–0.3)
Total Bilirubin: 0.5 mg/dL (ref 0.3–1.2)
Total Protein: 7.2 g/dL (ref 6.0–8.3)

## 2012-11-13 MED ORDER — CYCLOBENZAPRINE HCL 5 MG PO TABS: 5.0000 mg | ORAL_TABLET | Freq: Three times a day (TID) | ORAL | Status: DC | PRN

## 2012-11-13 NOTE — Patient Instructions (Signed)
It was good to see you today. We have reviewed your prior records including labs and tests today Test(s) ordered today. Your results will be released to MyChart (or called to you) after review, usually within 72hours after test completion. If any changes need to be made, you will be notified at that same time. use Flexeril at bedtime and as needed for back pain and muscle spasm -Your prescription(s) have been submitted to your pharmacy. Please take as directed and contact our office if you believe you are having problem(s) with the medication(s).  Okay to take Flexeril with over-the-counter ibuprofen every 6 hours as needed for pain

## 2012-11-13 NOTE — Progress Notes (Signed)
  Subjective:    Patient ID: April Cooper, female    DOB: 1965/12/07, 47 y.o.   MRN: [id number]  HPI  See CC Recent viral GE symptoms past 3 days, now resolved/improved Prior fatigue x 3 weeks, worse since viral GE  Review of Systems     Objective:   Physical Exam BP 118/72  Pulse 71  Temp 98.2 F (36.8 C) (Oral)  Wt 225 lb 12.8 oz (102.422 kg)  SpO2 97% Wt Readings from Last 3 Encounters:  11/13/12 225 lb 12.8 oz (102.422 kg)  09/16/12 225 lb (102.059 kg)  08/14/12 224 lb 6.4 oz (101.787 kg)   Constitutional: She appears well-developed and well-nourished. No distress.  Eyes: Conjunctivae and EOM are normal. Pupils are equal, round, and reactive to light. No scleral icterus.  Neck: Normal range of motion. Neck supple. No JVD present. No thyromegaly present.  Cardiovascular: Normal rate, regular rhythm and normal heart sounds.  No murmur heard. No BLE edema. Pulmonary/Chest: Effort normal and breath sounds normal. No respiratory distress. She has no wheezes.  Abdominal: Soft. Bowel sounds are normal. She exhibits no distension. There is no tenderness. no masses Musculoskeletal: Back: full range of motion of thoracic and lumbar spine. Non tender to palpation. Negative straight leg raise. DTR's are symmetrically intact. Sensation intact in all dermatomes of the lower extremities. Full strength to manual muscle testing. patient is able to heel toe walk without difficulty and ambulates with antalgic gait. Normal range of motion, no joint effusions. No gross deformities Neurological: She is alert and oriented to person, place, and time. No cranial nerve deficit. Coordination normal.  Skin: Skin is warm and dry. No rash noted. No erythema.  Psychiatric: She has a normal mood and affect. Her behavior is normal. Judgment and thought content normal.   Lab Results  Component Value Date   WBC 7.1 12/04/2011   HGB 13.5 12/04/2011   HCT 40.9 12/04/2011   PLT 349.0 12/04/2011   GLUCOSE 88  12/04/2011   CHOL 217* 12/04/2011   TRIG 104.0 12/04/2011   HDL 64.70 12/04/2011   LDLDIRECT 143.2 12/04/2011   ALT 15 12/04/2011   AST 13 12/04/2011   NA 139 12/04/2011   K 4.8 12/04/2011   CL 106 12/04/2011   CREATININE 0.8 12/04/2011   BUN 10 12/04/2011   CO2 27 12/04/2011   TSH 2.05 12/04/2011        Assessment & Plan:   Viral gastroenteritis  - improved but not resolved epigastric discomfort - exam benign but check labs including UA  Fatigue - nonspecific symptoms/exam - check screening labs  Back pain with muscle spasm on exam -no neuro deficits or other muscular skeletal findings. Treat with Flexeril each bedtime and as needed, continue over-the-counter ibuprofen as ongoing given improvement with same

## 2012-11-14 ENCOUNTER — Telehealth: Payer: Self-pay | Admitting: Internal Medicine

## 2012-11-14 ENCOUNTER — Other Ambulatory Visit: Payer: Self-pay | Admitting: *Deleted

## 2012-11-14 MED ORDER — HYDROCODONE-ACETAMINOPHEN 5-325 MG PO TABS: 1.0000 | ORAL_TABLET | Freq: Four times a day (QID) | ORAL | Status: DC | PRN

## 2012-11-14 NOTE — Telephone Encounter (Signed)
Notified pt with md response. [facility] in [place]...Raechel Chute

## 2012-11-14 NOTE — Telephone Encounter (Signed)
Pt is already aware. See results note...llmb

## 2012-11-14 NOTE — Telephone Encounter (Signed)
rx norco for pain - see med list below - take this in addition to ibuprofen AND flexeril

## 2012-11-14 NOTE — Telephone Encounter (Signed)
Patient is requesting a call back with her test results

## 2012-11-14 NOTE — Telephone Encounter (Signed)
Notified pt with lab results, but pt states she is still having the bck pain. Did start on the flexeril that was rx yesterday didn't help. She states pain woke her up last night. As of right now pain starting at the shoulder blade area going down the center of her bck. She describe the pain as a cramping feeling. Requesting md advisement on what to do...April Cooper

## 2012-12-03 ENCOUNTER — Encounter: Payer: 59 | Admitting: Internal Medicine

## 2012-12-06 ENCOUNTER — Ambulatory Visit (INDEPENDENT_AMBULATORY_CARE_PROVIDER_SITE_OTHER)
Admission: RE | Admit: 2012-12-06 | Discharge: 2012-12-06 | Disposition: A | Discharge status: Home / Self Care | Payer: 59 | Source: Ambulatory Visit | Attending: Internal Medicine | Admitting: Internal Medicine

## 2012-12-06 ENCOUNTER — Ambulatory Visit (INDEPENDENT_AMBULATORY_CARE_PROVIDER_SITE_OTHER): Payer: 59 | Admitting: Internal Medicine

## 2012-12-06 ENCOUNTER — Encounter: Payer: Self-pay | Admitting: Internal Medicine

## 2012-12-06 VITALS — BP 110/82 | HR 90 | Temp 97.8°F

## 2012-12-06 DIAGNOSIS — R109 Unspecified abdominal pain: Secondary | ICD-10-CM

## 2012-12-06 DIAGNOSIS — N200 Calculus of kidney: Secondary | ICD-10-CM

## 2012-12-06 DIAGNOSIS — R1031 Right lower quadrant pain: Secondary | ICD-10-CM

## 2012-12-06 DIAGNOSIS — R52 Pain, unspecified: Secondary | ICD-10-CM

## 2012-12-06 DIAGNOSIS — R93429 Abnormal radiologic findings on diagnostic imaging of unspecified kidney: Secondary | ICD-10-CM

## 2012-12-06 DIAGNOSIS — R9389 Abnormal findings on diagnostic imaging of other specified body structures: Secondary | ICD-10-CM

## 2012-12-06 LAB — POCT URINALYSIS DIPSTICK
Bilirubin, UA: NEGATIVE
Glucose, UA: NEGATIVE
Ketones, UA: NEGATIVE
Leukocytes, UA: NEGATIVE
Nitrite, UA: NEGATIVE
Protein, UA: 300
Spec Grav, UA: <=1.005
Urobilinogen, UA: 0.2
pH, UA: 5

## 2012-12-06 NOTE — Progress Notes (Signed)
Subjective:    Patient ID: April Cooper, female    DOB: 10-04-1966, 47 y.o.   MRN: [id number]  Flank Pain This is a new problem. The current episode started yesterday. The problem occurs intermittently. The problem has been waxing and waning since onset. The pain is present in the lumbar spine. The quality of the pain is described as aching, cramping and stabbing. Radiates to: Right lower quad and periumbilical region. The pain is severe (When present). The symptoms are aggravated by lying down. Stiffness is present all day. Associated symptoms include abdominal pain. Pertinent negatives include no bladder incontinence, bowel incontinence, chest pain, dysuria, fever (but + chills), headaches, leg pain, numbness, pelvic pain, perianal numbness, tingling, weakness or weight loss. Risk factors include obesity. She has tried bed rest for the symptoms. The treatment provided no relief.   Past Medical History  Diagnosis Date  . Vitamin D deficiency   . OBESITY   . Penicillin allergy 11/04/2002  . AMA (advanced maternal age) multigravida 35+   . Vulvovaginal candidiasis 07/2006  . Menometrorrhagia 01/2004, 12/2011    Review of Systems  Constitutional: Negative for fever (but + chills) and weight loss.  Respiratory: Negative for cough and shortness of breath.   Cardiovascular: Negative for chest pain.  Gastrointestinal: Positive for nausea and abdominal pain. Negative for vomiting, diarrhea, constipation and bowel incontinence.  Genitourinary: Positive for flank pain. Negative for bladder incontinence, dysuria, frequency, hematuria, vaginal bleeding, menstrual problem and pelvic pain.  Neurological: Negative for tingling, weakness, numbness and headaches.       Objective:   Physical Exam BP 110/82  Pulse 90  Temp(Src) 97.8 F (36.6 C) (Oral)  SpO2 97% Wt Readings from Last 3 Encounters:  11/13/12 225 lb 12.8 oz (102.422 kg)  09/16/12 225 lb (102.059 kg)  08/14/12 224 lb 6.4 oz  (101.787 kg)   Constitutional: She is overweight, but appears well-developed and well-nourished. No distress.   Neck: Normal range of motion. Neck supple. No JVD present. No thyromegaly present.  Cardiovascular: Normal rate, regular rhythm and normal heart sounds.  No murmur heard. No BLE edema. Pulmonary/Chest: Effort normal and breath sounds normal. No respiratory distress. She has no wheezes.  Abdominal: Soft. Bowel sounds are normal. She exhibits no distension. There is no tenderness. no masses Musculoskeletal: Back: full range of motion of thoracic and lumbar spine. Non tender to palpation. Negative straight leg raise. DTR's are symmetrically intact. Sensation intact in all dermatomes of the lower extremities. Full strength to manual muscle testing. patient is able to heel toe walk without difficulty and ambulates with normal gait. Skin: Skin is warm and dry. No rash noted. No erythema.  Psychiatric: She has a normal mood and affect. Her behavior is normal. Judgment and thought content normal.    Lab Results  Component Value Date   WBC 7.2 11/13/2012   HGB 14.4 11/13/2012   HCT 43.1 11/13/2012   PLT 294.0 11/13/2012   GLUCOSE 98 11/13/2012   CHOL 217* 12/04/2011   TRIG 104.0 12/04/2011   HDL 64.70 12/04/2011   LDLDIRECT 143.2 12/04/2011   ALT 23 11/13/2012   AST 19 11/13/2012   NA 139 11/13/2012   K 3.6 11/13/2012   CL 104 11/13/2012   CREATININE 0.7 11/13/2012   BUN 11 11/13/2012   CO2 29 11/13/2012   TSH 2.15 11/13/2012        Assessment & Plan:   Acute onset right flank pain 24 hours ago, intermittent cramping a very severity, currently  mild Radiates to right lower quadrant anteriorly and umbilical region -?kidney stone or other  Check urinalysis, rule out follow or kidney stone CT abdomen pelvis without contrast rule out stone or other abnormality

## 2012-12-06 NOTE — Patient Instructions (Signed)
It was good to see you today. Evidence of trace blood in urine today concerning for kidney stone Will refer for CT scan this afternoon as discussed to look for kidney stone or other problems. You will be called regarding these results once available Until then, keep hydrated and use ibuprofen or hydrocodone as needed for pain

## 2012-12-09 NOTE — Addendum Note (Signed)
Addended by: Rene Paci A on: 12/09/2012 08:23 AM   Modules accepted: Orders

## 2012-12-13 ENCOUNTER — Inpatient Hospital Stay: Admission: RE | Admit: 2012-12-13 | Payer: 59 | Source: Ambulatory Visit

## 2012-12-16 LAB — HM PAP SMEAR

## 2013-01-03 ENCOUNTER — Ambulatory Visit (INDEPENDENT_AMBULATORY_CARE_PROVIDER_SITE_OTHER)
Admission: RE | Admit: 2013-01-03 | Discharge: 2013-01-03 | Disposition: A | Discharge status: Home / Self Care | Payer: 59 | Source: Ambulatory Visit | Attending: Internal Medicine | Admitting: Internal Medicine

## 2013-01-03 DIAGNOSIS — N200 Calculus of kidney: Secondary | ICD-10-CM

## 2013-01-03 DIAGNOSIS — R93429 Abnormal radiologic findings on diagnostic imaging of unspecified kidney: Secondary | ICD-10-CM

## 2013-01-03 DIAGNOSIS — R9389 Abnormal findings on diagnostic imaging of other specified body structures: Secondary | ICD-10-CM

## 2013-01-03 DIAGNOSIS — R52 Pain, unspecified: Secondary | ICD-10-CM

## 2013-01-03 DIAGNOSIS — R109 Unspecified abdominal pain: Secondary | ICD-10-CM

## 2013-01-03 DIAGNOSIS — R1031 Right lower quadrant pain: Secondary | ICD-10-CM

## 2013-01-03 MED ORDER — IOHEXOL 300 MG/ML  SOLN
100.0000 mL | Freq: Once | INTRAMUSCULAR | Status: AC | PRN
  Administered 2013-01-03: 100 mL via INTRAVENOUS

## 2013-01-06 ENCOUNTER — Telehealth: Payer: Self-pay | Admitting: *Deleted

## 2013-01-06 DIAGNOSIS — N281 Cyst of kidney, acquired: Secondary | ICD-10-CM

## 2013-01-06 DIAGNOSIS — N200 Calculus of kidney: Secondary | ICD-10-CM

## 2013-01-06 NOTE — Telephone Encounter (Signed)
Notified pt with md response concerning CT. Pt still have concerns. She states she doesn't understand. Wanting to know where did the cyst come from. Will it grow? She is still having the back pain assuming it coming from stone but not sure. ? If she need to see urologist.../lmb

## 2013-01-06 NOTE — Telephone Encounter (Signed)
Will refer to uro to help reassure as requested -  or OV here if pt would like more details, but i do not have any more info than what as already been shared. thanks

## 2013-01-06 NOTE — Telephone Encounter (Signed)
Notified pt with md response. Pt is wanting to proceed with referral.../lmb

## 2013-01-08 ENCOUNTER — Other Ambulatory Visit: Payer: Self-pay | Admitting: Urology

## 2013-01-10 ENCOUNTER — Encounter (HOSPITAL_COMMUNITY): Payer: Self-pay | Admitting: Pharmacy Technician

## 2013-01-13 ENCOUNTER — Encounter (HOSPITAL_COMMUNITY)
Admission: RE | Admit: 2013-01-13 | Discharge: 2013-01-13 | Disposition: A | Discharge status: Home / Self Care | Payer: 59 | Source: Ambulatory Visit | Attending: Urology | Admitting: Urology

## 2013-01-13 ENCOUNTER — Encounter (HOSPITAL_COMMUNITY): Payer: Self-pay

## 2013-01-13 LAB — SURGICAL PCR SCREEN
MRSA, PCR: NEGATIVE
Staphylococcus aureus: NEGATIVE

## 2013-01-13 LAB — BASIC METABOLIC PANEL
BUN: 8 mg/dL (ref 6–23)
CO2: 25 mEq/L (ref 19–32)
Calcium: 9.1 mg/dL (ref 8.4–10.5)
Chloride: 102 mEq/L (ref 96–112)
Creatinine, Ser: 0.75 mg/dL (ref 0.50–1.10)
GFR calc Af Amer: >90 mL/min (ref >90)
GFR calc non Af Amer: >90 mL/min (ref >90)
Glucose, Bld: 73 mg/dL (ref 70–99)
Potassium: 4.2 mEq/L (ref 3.5–5.1)
Sodium: 136 mEq/L (ref 135–145)

## 2013-01-13 LAB — CBC
HCT: 43.9 % (ref 36.0–46.0)
Hemoglobin: 14.1 g/dL (ref 12.0–15.0)
MCH: 28.4 pg (ref 26.0–34.0)
MCHC: 32.1 g/dL (ref 30.0–36.0)
MCV: 88.3 fL (ref 78.0–100.0)
Platelets: 400 10*3/uL (ref 150–400)
RBC: 4.97 MIL/uL (ref 3.87–5.11)
RDW: 13.8 % (ref 11.5–15.5)
WBC: 7.5 10*3/uL (ref 4.0–10.5)

## 2013-01-13 NOTE — Patient Instructions (Signed)
20 April Cooper  01/13/2013   Your procedure is scheduled on: 01/16/13  Report to [facility] at 0515 AM.  Call this number if you have problems the morning of surgery [phone]: [phone]   Remember:   Do not eat food or drink liquids After Midnight.      Do not wear jewelry, make-up or nail polish.  Do not wear lotions, powders, or perfumes. You may wear deodorant.  Do not shave 48 hours prior to surgery. Men may shave face and neck.  Do not bring valuables to the hospital.  Contacts, dentures or bridgework may not be worn into surgery.     Patients discharged the day of surgery will not be allowed to drive home.  Name and phone number of your driver: Homero Fellers [phone]    Please read over the following fact sheets that you were given: MRSA Information.  Birdie Sons, RN  pre op nurse call if needed [phone]    FAILURE TO FOLLOW THESE INSTRUCTIONS MAY RESULT IN CANCELLATION OF YOUR SURGERY   Patient Signature: ___________________________________________

## 2013-01-15 NOTE — H&P (Signed)
Chief Complaint  Right renal calculus and right sided back pain   History of Present Illness April Cooper is a 47 year old female seen at the request of Dr. Rene Paci for a right renal calculus. She developed right-sided lower back pain at the end of January and developed this intermittently in February. She did describe some nausea with her initial presentation although has not had any nausea, vomiting, or fever associated with her ongoing pain symptoms. She did have a CT scan without contrast performed on 12/06/12. This was independently reviewed today and demonstrates a nonobstructing 10 mm lower pole right renal calculus. There is no hydronephrosis and no ureteral calculi. She does have what appears to be a left peripelvic renal cyst. She had continued pain throughout the month of March which she describes as severe and specifically located toward the medial right lower back. This pain would typically get worse throughout the day particularly when sitting for long periods of time at work. She has not had any pain radiation to her abdomen. She had a repeat CT scan on 01/03/13 which was performed with contrast. This helped to confirm the left peripelvic cyst. In addition, this demonstrated the 10 mm lower pole right renal calculus to be in the same exact position in the lower pole and nonobstructing.  She denies a history personally or family history of kidney stones. She otherwise is quite healthy without other comorbid conditions.   Past Medical History Problems  1. History of  No Medical Problems  Surgical History Problems  1. History of  Hysteroscopy With Endometrial Ablation 2. History of  Tubal Ligation V25.2  Current Meds 1. Multi-Day Vitamins TABS; Therapy: (Recorded:02Apr2014) to  Allergies Medication  1. Penicillins  Family History Problems  1. Family history of  Diabetes Mellitus V18.0 2. Family history of  Hypertension V17.49  Social History Problems    Being A  Social Drinker   Marital History - Currently Married   Never A Smoker  Review of Systems Constitutional, skin, eye, otolaryngeal, hematologic/lymphatic, cardiovascular, pulmonary, endocrine, musculoskeletal, gastrointestinal, neurological and psychiatric system(s) were reviewed and pertinent findings if present are noted.  Musculoskeletal: back pain.    Vitals Vital Signs [Data Includes: Last 1 Day]  02Apr2014 11:40AM  BMI Calculated: 32.49 BSA Calculated: 1.96 Height: 5 ft 5 in Weight: 195 lb  Blood Pressure: 128 / 80 Temperature: 98.3 F Heart Rate: 65  Physical Exam Constitutional: Well nourished and well developed . No acute distress.  ENT:. The ears and nose are normal in appearance.  Neck: The appearance of the neck is normal and no neck mass is present.  Pulmonary: No respiratory distress, normal respiratory rhythm and effort and clear bilateral breath sounds.  Cardiovascular: Heart rate and rhythm are normal . No peripheral edema.  Abdomen: The abdomen is soft and nontender. No masses are palpated. No hernias are palpable. No hepatosplenomegaly noted. She does not have any true CVA tenderness but does have tenderness along her paraspinous muscle on the right side which is reproducible.  Skin: Normal skin turgor, no visible rash and no visible skin lesions.  Neuro/Psych:. Mood and affect are appropriate.    Results/Data Urine [Data Includes: Last 1 Day]   02Apr2014  COLOR YELLOW   APPEARANCE CLEAR   SPECIFIC GRAVITY 1.025   pH 7.0   GLUCOSE NEG mg/dL  BILIRUBIN NEG   KETONE NEG mg/dL  BLOOD TRACE   PROTEIN NEG mg/dL  UROBILINOGEN 0.2 mg/dL  NITRITE NEG   LEUKOCYTE ESTERASE NEG  SQUAMOUS EPITHELIAL/HPF FEW   WBC 0-2 WBC/hpf  RBC 0-2 RBC/hpf  BACTERIA RARE   CRYSTALS NONE SEEN   CASTS NONE SEEN           I reviewed her medical records. Her serum creatinine was 0.7. Her urinalysis demonstrated red blood cells but no evidence for infection. Independently  reviewed her CT scans from 12/06/12 and 01/03/13 with findings as dictated above.  Hounsfield units of her stone measures 510.  I independently reviewed her KUB x-ray. There is overlying bowel gas obscures her right kidney and it is difficult to see her stone well.   Plan Health Maintenance (V70.0)  1. UA With REFLEX  Done: 02Apr2014 11:30AM Nephrolithiasis (592.0)  2. KUB  Done: 02Apr2014 12:00AM  Discussion/Summary    1. Right-sided back pain: I explained to Ms. Niemann that her back pain does not appear to be related to her kidney stone considering the fact that her kidney stone is nonobstructing. I also do not think that it has been intermittently obstructing based on the fact her symptoms have been persistent, more associated with activity throughout the day, and the fact that her stone has not changed position between CT scans one month apart. I therefore think this is likely musculoskeletal I recommended conservative therapy with anti-inflammatories and heat. She may require further evaluation by Dr. Felicity Coyer.   2. Right renal calculus: I reassured her that I do not suspect this is the source of her pain symptoms. However, she also understands that the stone would not be likely to spontaneously pass in the future either. She understands that this may continue to be asymptomatic or could cause symptoms at any point in the future. After a discussion and her understanding that her pain symptoms and her kidney stone or unrelated, she still wishes to proceed with definitive therapy to avoid any future problems related to this stone. We therefore discussed options which would include ureteroscopic laser lithotripsy, percutaneous nephrolithotomy, or extracorporeal shockwave lithotripsy. We reviewed the pros and cons of each approach and the risks, complications, and expected recovery process associated with each approach.  Considering that her stone is not well visualized on plain x-ray, she is most  interested in proceeding with ureteroscopic laser lithotripsy. She would like to have this performed in the near future and this will be scheduled.   Dr. Rene Paci     Signatures Electronically signed by : Heloise Purpura, M.D.; Jan 08 2013  1:19PM

## 2013-01-16 ENCOUNTER — Encounter (HOSPITAL_COMMUNITY): Payer: Self-pay

## 2013-01-16 ENCOUNTER — Encounter (HOSPITAL_COMMUNITY): Payer: Self-pay | Admitting: Anesthesiology

## 2013-01-16 ENCOUNTER — Encounter (HOSPITAL_COMMUNITY)
Admission: RE | Disposition: A | Discharge status: Home / Self Care | Payer: Self-pay | Source: Ambulatory Visit | Attending: Urology

## 2013-01-16 ENCOUNTER — Ambulatory Visit (HOSPITAL_COMMUNITY)
Admission: RE | Admit: 2013-01-16 | Discharge: 2013-01-16 | Disposition: A | Discharge status: Home / Self Care | Payer: 59 | Source: Ambulatory Visit | Attending: Urology | Admitting: Urology

## 2013-01-16 ENCOUNTER — Ambulatory Visit (HOSPITAL_COMMUNITY): Payer: 59 | Admitting: Anesthesiology

## 2013-01-16 DIAGNOSIS — N200 Calculus of kidney: Secondary | ICD-10-CM | POA: Insufficient documentation

## 2013-01-16 DIAGNOSIS — Z01812 Encounter for preprocedural laboratory examination: Secondary | ICD-10-CM | POA: Insufficient documentation

## 2013-01-16 HISTORY — PX: CYSTOSCOPY WITH RETROGRADE PYELOGRAM, URETEROSCOPY AND STENT PLACEMENT: SHX5789

## 2013-01-16 HISTORY — PX: HOLMIUM LASER APPLICATION: SHX5852

## 2013-01-16 SURGERY — CYSTOURETEROSCOPY, WITH RETROGRADE PYELOGRAM AND STENT INSERTION
Anesthesia: General | Laterality: Right | Wound class: Clean Contaminated

## 2013-01-16 MED ORDER — HYDROCODONE-ACETAMINOPHEN 5-325 MG PO TABS: 1.0000 | ORAL_TABLET | Freq: Four times a day (QID) | ORAL | Status: DC | PRN

## 2013-01-16 MED ORDER — ACETAMINOPHEN 10 MG/ML IV SOLN
INTRAVENOUS | Status: AC
  Filled 2013-01-16: qty 100

## 2013-01-16 MED ORDER — FENTANYL CITRATE 0.05 MG/ML IJ SOLN
INTRAMUSCULAR | Status: DC | PRN
  Administered 2013-01-16 (×4): 50 ug via INTRAVENOUS

## 2013-01-16 MED ORDER — LIDOCAINE HCL (CARDIAC) 20 MG/ML IV SOLN
INTRAVENOUS | Status: DC | PRN
  Administered 2013-01-16: 30 mg via INTRAVENOUS

## 2013-01-16 MED ORDER — SODIUM CHLORIDE 0.9 % IR SOLN
Status: DC | PRN
  Administered 2013-01-16: 5000 mL via INTRAVESICAL

## 2013-01-16 MED ORDER — IOHEXOL 300 MG/ML  SOLN
INTRAMUSCULAR | Status: DC | PRN
  Administered 2013-01-16: 8 mL

## 2013-01-16 MED ORDER — PROPOFOL 10 MG/ML IV BOLUS
INTRAVENOUS | Status: DC | PRN
  Administered 2013-01-16: 200 mg via INTRAVENOUS

## 2013-01-16 MED ORDER — 0.9 % SODIUM CHLORIDE (POUR BTL) OPTIME
TOPICAL | Status: DC | PRN
  Administered 2013-01-16: 1000 mL

## 2013-01-16 MED ORDER — CIPROFLOXACIN IN D5W 400 MG/200ML IV SOLN
400.0000 mg | INTRAVENOUS | Status: AC
  Administered 2013-01-16: 400 mg via INTRAVENOUS

## 2013-01-16 MED ORDER — IOHEXOL 300 MG/ML  SOLN
INTRAMUSCULAR | Status: AC
  Filled 2013-01-16: qty 1

## 2013-01-16 MED ORDER — CIPROFLOXACIN IN D5W 400 MG/200ML IV SOLN
INTRAVENOUS | Status: AC
  Filled 2013-01-16: qty 200

## 2013-01-16 MED ORDER — LACTATED RINGERS IV SOLN: INTRAVENOUS | Status: DC

## 2013-01-16 MED ORDER — ONDANSETRON HCL 4 MG/2ML IJ SOLN
INTRAMUSCULAR | Status: DC | PRN
  Administered 2013-01-16: 4 mg via INTRAVENOUS

## 2013-01-16 MED ORDER — ACETAMINOPHEN 10 MG/ML IV SOLN
INTRAVENOUS | Status: DC | PRN
  Administered 2013-01-16: 1000 mg via INTRAVENOUS

## 2013-01-16 MED ORDER — MIDAZOLAM HCL 5 MG/5ML IJ SOLN
INTRAMUSCULAR | Status: DC | PRN
  Administered 2013-01-16: 2 mg via INTRAVENOUS

## 2013-01-16 MED ORDER — PROMETHAZINE HCL 25 MG/ML IJ SOLN
6.2500 mg | INTRAMUSCULAR | Status: DC | PRN
  Administered 2013-01-16: 6.25 mg via INTRAVENOUS
  Filled 2013-01-16: qty 1

## 2013-01-16 MED ORDER — KETOROLAC TROMETHAMINE 30 MG/ML IJ SOLN
15.0000 mg | Freq: Once | INTRAMUSCULAR | Status: AC | PRN
  Administered 2013-01-16: 30 mg via INTRAVENOUS
  Filled 2013-01-16: qty 1

## 2013-01-16 MED ORDER — CIPROFLOXACIN HCL 500 MG PO TABS: 500.0000 mg | ORAL_TABLET | Freq: Two times a day (BID) | ORAL | Status: DC

## 2013-01-16 MED ORDER — LACTATED RINGERS IV SOLN
INTRAVENOUS | Status: DC | PRN
  Administered 2013-01-16: 07:00:00 via INTRAVENOUS

## 2013-01-16 MED ORDER — FENTANYL CITRATE 0.05 MG/ML IJ SOLN: 25.0000 ug | INTRAMUSCULAR | Status: DC | PRN

## 2013-01-16 SURGICAL SUPPLY — 19 items
ADAPTER CATH URET PLST 4-6FR (CATHETERS) ×2 IMPLANT
ADPR CATH URET STRL DISP 4-6FR (CATHETERS) ×1
BAG URO CATCHER STRL LF (DRAPE) ×2 IMPLANT
BASKET ZERO TIP NITINOL 2.4FR (BASKET) ×1 IMPLANT
BSKT STON RTRVL ZERO TP 2.4FR (BASKET) ×1
CATH INTERMIT  6FR 70CM (CATHETERS) ×1 IMPLANT
CLOTH BEACON ORANGE TIMEOUT ST (SAFETY) ×2 IMPLANT
DRAPE CAMERA CLOSED 9X96 (DRAPES) ×2 IMPLANT
FIBER LASER TRAC TIP (UROLOGICAL SUPPLIES) ×1 IMPLANT
GLOVE BIOGEL M STRL SZ7.5 (GLOVE) ×2 IMPLANT
GOWN PREVENTION PLUS XLARGE (GOWN DISPOSABLE) ×2 IMPLANT
GOWN STRL NON-REIN LRG LVL3 (GOWN DISPOSABLE) ×4 IMPLANT
GUIDEWIRE ANG ZIPWIRE 038X150 (WIRE) IMPLANT
GUIDEWIRE STR DUAL SENSOR (WIRE) ×2 IMPLANT
MANIFOLD NEPTUNE II (INSTRUMENTS) ×2 IMPLANT
PACK CYSTO (CUSTOM PROCEDURE TRAY) ×2 IMPLANT
SHEATH ACCESS URETERAL 38CM (SHEATH) ×1 IMPLANT
STENT CONTOUR 6FRX24X.038 (STENTS) ×1 IMPLANT
TUBING CONNECTING 10 (TUBING) ×2 IMPLANT

## 2013-01-16 NOTE — Interval H&P Note (Signed)
History and Physical Interval Note:  01/16/2013 7:15 AM  April Cooper Standing  has presented today for surgery, with the diagnosis of RIGHT RENAL CALCULUS  The various methods of treatment have been discussed with the patient and family. After consideration of risks, benefits and other options for treatment, the patient has consented to  Procedure(s): CYSTOSCOPY WITH RETROGRADE PYELOGRAM, URETEROSCOPY AND STENT PLACEMENT (Right) HOLMIUM LASER APPLICATION (Right) as a surgical intervention .  The patient's history has been reviewed, patient examined, no change in status, stable for surgery.  I have reviewed the patient's chart and labs.  Questions were answered to the patient's satisfaction.     Lindell Tussey,LES

## 2013-01-16 NOTE — Op Note (Signed)
Preoperative diagnosis: Right renal calculus  Postoperative diagnosis: Right renal calculi  Procedure:  1. Cystoscopy 2. Right ureteroscopy and stone removal 3. Ureteroscopic laser lithotripsy 4. Righ ureteral stent placement (6 x 24) 5. Right retrograde pyelography with interpretation  Surgeon: Moody Bruins. M.D.  Anesthesia: General  Complications: None  Intraoperative findings: Right retrograde pyelography demonstrated a normal caliber ureter without filling defects. The renal collecting system was not dilated and there was no clear filling defect identified.  EBL: Minimal  Specimens: 1. Right renal calculi  Disposition of specimens: [facility] for stone analysis  Indication: April Cooper  is a 47 y.o. patient with a right renal calculus.  She presented with a complaint of right sided back pain and was found to have a non-obstructing right renal calculus.  It was explained that this did not appear to be the source of her pain and surgical removal of her calculus would likely not improve her pain symptoms.  We therefore discussed observation of her stone versus definitive management.  After reviewing the management options for treatment, she elected to proceed with the above surgical procedure(s). We have discussed the potential benefits and risks of the procedure, side effects of the proposed treatment, the likelihood of the patient achieving the goals of the procedure, and any potential problems that might occur during the procedure or recuperation. Informed consent has been obtained.  Description of procedure:  The patient was taken to the operating room and general anesthesia was induced.  The patient was placed in the dorsal lithotomy position, prepped and draped in the usual sterile fashion, and preoperative antibiotics were administered. A preoperative time-out was performed.   Cystourethroscopy was performed.  The patient's urethra was  examined and was normal. The bladder was then systematically examined in its entirety. There was no evidence for any bladder tumors, stones, or other mucosal pathology.    Attention then turned to the right ureteral orifice and a ureteral catheter was used to intubate the ureteral orifice.  Omnipaque contrast was injected through the ureteral catheter and a retrograde pyelogram was performed with findings as dictated above.  A 0.38 sensor guidewire was then advanced up the right ureter into the renal pelvis under fluoroscopic guidance.  A 12/14 Fr ureteral access sheath was then advance over the guide wire into the proximal ureter. The digital flexible ureteroscope was then advanced through the access sheath into the ureter next to the guidewire and initially no calculus was seen.  Eventually, a cluster a multiple calculi measuring 3-5 mm each were identified and was located in the lower pole calyx and partially hidden by a bridge of tissue.   The 200 micron holmium laser fiber on a setting of 0.6 J and frequency of 6 Hz was used to open the tissue bridge that was hiding the stones. The stones were fragmented into appropriate sized fragments.   All visible stones were then removed with a zero tip nitinol basket.  Reinspection of the ureter/renal pelvis revealed no remaining visible stones or fragments of significant size.   The safety wire was then replaced and the access sheath removed.  The guidewire was backloaded through the cystoscope and a ureteral stent was advance over the wire using Seldinger technique.  The stent was positioned appropriately under fluoroscopic and cystoscopic guidance.  The wire was then removed with an adequate stent curl noted in the renal pelvis as well as in the bladder.  The bladder was then emptied and the procedure ended.  The patient appeared to tolerate the procedure well and without complications.  The patient was able to be awakened and transferred to the recovery  unit in satisfactory condition.   Moody Bruins MD

## 2013-01-16 NOTE — Progress Notes (Signed)
Pt given tordol 30mg  for pain and 6.25mg  of phenergan for nausea.  Pt states she is feeling much better and is ready for d/c home.

## 2013-01-16 NOTE — Transfer of Care (Signed)
Immediate Anesthesia Transfer of Care Note  Patient: April Cooper  Procedure(s) Performed: Procedure(s): CYSTOSCOPY WITH RETROGRADE PYELOGRAM, URETEROSCOPY AND STENT PLACEMENT (Right) HOLMIUM LASER APPLICATION (Right)  Patient Location: PACU  Anesthesia Type:General  Level of Consciousness: awake, alert , oriented and patient cooperative  Airway & Oxygen Therapy: Patient Spontanous Breathing and Patient connected to face mask oxygen  Post-op Assessment: Report given to PACU RN and Post -op Vital signs reviewed and stable  Post vital signs: Reviewed and stable  Complications: No apparent anesthesia complications

## 2013-01-16 NOTE — Anesthesia Postprocedure Evaluation (Signed)
  Anesthesia Post-op Note  Patient: April Cooper  Procedure(s) Performed: Procedure(s) (LRB): CYSTOSCOPY WITH RETROGRADE PYELOGRAM, URETEROSCOPY AND STENT PLACEMENT (Right) HOLMIUM LASER APPLICATION (Right)  Patient Location: PACU  Anesthesia Type: General  Level of Consciousness: awake and alert   Airway and Oxygen Therapy: Patient Spontanous Breathing  Post-op Pain: mild  Post-op Assessment: Post-op Vital signs reviewed, Patient's Cardiovascular Status Stable, Respiratory Function Stable, Patent Airway and No signs of Nausea or vomiting  Last Vitals:  Filed Vitals:   01/16/13 0507  BP: 141/80  Pulse: 63  Temp: 36.6 C  Resp: 18    Post-op Vital Signs: stable   Complications: No apparent anesthesia complications

## 2013-01-16 NOTE — Anesthesia Preprocedure Evaluation (Addendum)
Anesthesia Evaluation  Patient identified by MRN, date of birth, ID band Patient awake    Reviewed: Allergy & Precautions, H&P , NPO status , Patient's Chart, lab work & pertinent test results  Airway Mallampati: III TM Distance: <3 FB Neck ROM: Full    Dental no notable dental hx.    Pulmonary neg pulmonary ROS,  breath sounds clear to auscultation  Pulmonary exam normal       Cardiovascular negative cardio ROS  Rhythm:Regular Rate:Normal     Neuro/Psych negative neurological ROS  negative psych ROS   GI/Hepatic negative GI ROS, Neg liver ROS,   Endo/Other  Morbid obesity  Renal/GU negative Renal ROS  negative genitourinary   Musculoskeletal negative musculoskeletal ROS (+)   Abdominal   Peds negative pediatric ROS (+)  Hematology negative hematology ROS (+)   Anesthesia Other Findings   Reproductive/Obstetrics negative OB ROS                           Anesthesia Physical Anesthesia Plan  ASA: II  Anesthesia Plan: General   Post-op Pain Management:    Induction: Intravenous  Airway Management Planned: LMA  Additional Equipment:   Intra-op Plan:   Post-operative Plan:   Informed Consent: I have reviewed the patients History and Physical, chart, labs and discussed the procedure including the risks, benefits and alternatives for the proposed anesthesia with the patient or authorized representative who has indicated his/her understanding and acceptance.   Dental advisory given  Plan Discussed with: CRNA and Surgeon  Anesthesia Plan Comments:         Anesthesia Quick Evaluation  

## 2013-01-17 ENCOUNTER — Encounter (HOSPITAL_COMMUNITY): Payer: Self-pay | Admitting: Urology

## 2013-02-10 ENCOUNTER — Ambulatory Visit: Payer: 59 | Admitting: Internal Medicine

## 2013-02-10 DIAGNOSIS — Z0289 Encounter for other administrative examinations: Secondary | ICD-10-CM

## 2013-03-17 ENCOUNTER — Ambulatory Visit (INDEPENDENT_AMBULATORY_CARE_PROVIDER_SITE_OTHER): Payer: 59 | Admitting: Internal Medicine

## 2013-03-17 ENCOUNTER — Encounter: Payer: Self-pay | Admitting: Internal Medicine

## 2013-03-17 ENCOUNTER — Other Ambulatory Visit (INDEPENDENT_AMBULATORY_CARE_PROVIDER_SITE_OTHER): Payer: 59

## 2013-03-17 VITALS — BP 120/82 | HR 64 | Temp 98.0°F | Wt 238.0 lb

## 2013-03-17 DIAGNOSIS — R609 Edema, unspecified: Secondary | ICD-10-CM

## 2013-03-17 LAB — CBC
HCT: 40.9 % (ref 36.0–46.0)
Hemoglobin: 13.6 g/dL (ref 12.0–15.0)
MCHC: 33.1 g/dL (ref 30.0–36.0)
MCV: 87.8 fl (ref 78.0–100.0)
Platelets: 287 10*3/uL (ref 150.0–400.0)
RBC: 4.66 Mil/uL (ref 3.87–5.11)
RDW: 13.9 % (ref 11.5–14.6)
WBC: 8.8 10*3/uL (ref 4.5–10.5)

## 2013-03-17 LAB — BASIC METABOLIC PANEL
BUN: 9 mg/dL (ref 6–23)
CO2: 27 mEq/L (ref 19–32)
Calcium: 9.1 mg/dL (ref 8.4–10.5)
Chloride: 103 mEq/L (ref 96–112)
Creatinine, Ser: 0.8 mg/dL (ref 0.4–1.2)
GFR: 94.11 mL/min (ref >60.00)
Glucose, Bld: 90 mg/dL (ref 70–99)
Potassium: 4 mEq/L (ref 3.5–5.1)
Sodium: 137 mEq/L (ref 135–145)

## 2013-03-17 NOTE — Patient Instructions (Signed)

## 2013-03-17 NOTE — Progress Notes (Signed)
Subjective:    Patient ID: April Cooper, female    DOB: [birth year], 47 y.o.   MRN: [id number]  HPI  Pt presents to the clinic today with c/o bilateral feet and hand swelling. This is a new problem. It seems to be worse throughout the day. She noticed it after she had surgery to remove a kidney stone through her urethra. Sometimes, the swelling is accompanied by numbness and tingling. This most often occurs at night. She does consume some salt  In her diet. She has no history of hypertension or congestive heart failure. She denies cough, shortness of breath, chest tightness or chest pain. Review of Systems      Past Medical History  Diagnosis Date  . Vitamin D deficiency   . OBESITY   . Penicillin allergy 11/04/2002  . AMA (advanced maternal age) multigravida 35+   . Vulvovaginal candidiasis 07/2006  . Menometrorrhagia 01/2004, 12/2011    Current Outpatient Prescriptions  Medication Sig Dispense Refill  . Multiple Vitamin (MULTIVITAMIN WITH MINERALS) TABS Take 1 tablet by mouth daily.      Marland Kitchen ibuprofen (ADVIL,MOTRIN) 200 MG tablet Take 400 mg by mouth every 6 (six) hours as needed for pain.       No current facility-administered medications for this visit.    Allergies  Allergen Reactions  . Penicillins Rash    Family History  Problem Relation Age of Onset  . Hypertension Mother   . Hypertension Father   . Diabetes Sister   . Diabetes Maternal Aunt   . Hypertension Maternal Grandmother     History   Social History  . Marital Status: Married    Spouse Name: N/A    Number of Children: N/A  . Years of Education: N/A   Occupational History  . Not on file.   Social History Main Topics  . Smoking status: Never Smoker   . 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  . Alcohol Use: Yes     Comment: rare  . Drug Use: No  . Sexually Active: Not Currently   Other Topics Concern  . Not on file    Social History Narrative  . No narrative on file     Constitutional: Denies fever, malaise, fatigue, headache or abrupt weight changes.  Respiratory: Denies difficulty breathing, shortness of breath, cough or sputum production.   Cardiovascular: Pt reports swelling of hands and feet. Denies chest pain, chest tightness, palpitations.    No other specific complaints in a complete review of systems (except as listed in HPI above).  Objective:   Physical Exam   BP 120/82  Pulse 64  Temp(Src) 98 F (36.7 C) (Oral)  Wt 238 lb (107.956 kg)  BMI 39.61 kg/m2  SpO2 98% Wt Readings from Last 3 Encounters:  03/17/13 238 lb (107.956 kg)  01/13/13 232 lb (105.235 kg)  11/13/12 225 lb 12.8 oz (102.422 kg)    General: Appears her  stated age, well developed, well nourished in NAD. Cardiovascular: Normal rate and rhythm. S1,S2 noted.  No murmur, rubs or gallops noted. No JVD,1+ non pitting edema of BLE, no edema of BUE. No carotid bruits noted. Pulmonary/Chest: Normal effort and positive vesicular breath sounds. No respiratory distress. No wheezes, rales or ronchi noted.    BMET    Component Value Date/Time   NA 137 03/17/2013 1614   K 4.0 03/17/2013 1614   CL 103 03/17/2013 1614  CO2 27 03/17/2013 1614   GLUCOSE 90 03/17/2013 1614   BUN 9 03/17/2013 1614   CREATININE 0.8 03/17/2013 1614   CALCIUM 9.1 03/17/2013 1614   GFRNONAA >90 01/13/2013 1320   GFRAA >90 01/13/2013 1320    Lipid Panel     Component Value Date/Time   CHOL 217* 12/04/2011 1123   TRIG 104.0 12/04/2011 1123   HDL 64.70 12/04/2011 1123   CHOLHDL 3 12/04/2011 1123   VLDL 20.8 12/04/2011 1123    CBC    Component Value Date/Time   WBC 8.8 03/17/2013 1614   RBC 4.66 03/17/2013 1614   HGB 13.6 03/17/2013 1614   HCT 40.9 03/17/2013 1614   PLT 287.0 03/17/2013 1614   MCV 87.8 03/17/2013 1614   MCH 28.4 01/13/2013 1320   MCHC 33.1 03/17/2013 1614   RDW 13.9 03/17/2013 1614   LYMPHSABS 1.0 11/13/2012 1559   MONOABS 1.0 11/13/2012 1559   EOSABS  0.0 11/13/2012 1559   BASOSABS 0.0 11/13/2012 1559    Hgb A1C No results found for this basename: HGBA1C        Assessment & Plan:   Peripheral edema, new onset:  Will check CBC and RFP If not related to kidney dysfunction, will suggest starting HCTZ to see if it improves  Will f/u after test results

## 2013-03-18 ENCOUNTER — Other Ambulatory Visit: Payer: Self-pay | Admitting: Internal Medicine

## 2013-03-18 DIAGNOSIS — R609 Edema, unspecified: Secondary | ICD-10-CM

## 2013-03-18 MED ORDER — HYDROCHLOROTHIAZIDE 12.5 MG PO CAPS: 12.5000 mg | ORAL_CAPSULE | Freq: Every day | ORAL | Status: DC

## 2013-05-01 ENCOUNTER — Other Ambulatory Visit: Payer: Self-pay

## 2013-05-01 DIAGNOSIS — Z1231 Encounter for screening mammogram for malignant neoplasm of breast: Secondary | ICD-10-CM

## 2013-05-19 ENCOUNTER — Ambulatory Visit
Admission: RE | Admit: 2013-05-19 | Discharge: 2013-05-19 | Disposition: A | Discharge status: Home / Self Care | Payer: 59 | Source: Ambulatory Visit

## 2013-05-19 DIAGNOSIS — Z1231 Encounter for screening mammogram for malignant neoplasm of breast: Secondary | ICD-10-CM

## 2013-08-13 ENCOUNTER — Telehealth: Payer: Self-pay | Admitting: Internal Medicine

## 2013-08-13 NOTE — Telephone Encounter (Signed)
Called pt wanted to inform md that mother pass away. Inform pt md received notice from hospital. Gave her our sympathy.Marland KitchenRaechel Cooper

## 2013-08-13 NOTE — Telephone Encounter (Signed)
08/13/2013  Pt would like to speak with Lucy B.  Please contact pt at earliest convenience.

## 2013-11-04 ENCOUNTER — Telehealth: Payer: Self-pay

## 2013-11-04 NOTE — Telephone Encounter (Signed)
The patient called the triage line and is hoping to speak with the [facility] as soon as possible.  On her voice message, she did not leave what her specific needs were.

## 2013-11-05 NOTE — Telephone Encounter (Signed)
Spoke with pt on yesterday wanted to confirm cpx appt...April Cooper

## 2013-11-12 ENCOUNTER — Encounter: Payer: Self-pay | Admitting: Internal Medicine

## 2013-11-12 ENCOUNTER — Ambulatory Visit (INDEPENDENT_AMBULATORY_CARE_PROVIDER_SITE_OTHER): Payer: 59 | Admitting: Internal Medicine

## 2013-11-12 VITALS — BP 120/82 | HR 67 | Temp 97.9°F | Wt 215.8 lb

## 2013-11-12 DIAGNOSIS — J329 Chronic sinusitis, unspecified: Secondary | ICD-10-CM

## 2013-11-12 MED ORDER — PROMETHAZINE-PHENYLEPHRINE 6.25-5 MG/5ML PO SYRP: 5.0000 mL | ORAL_SOLUTION | ORAL | Status: DC | PRN

## 2013-11-12 MED ORDER — LEVOFLOXACIN 500 MG PO TABS: 500.0000 mg | ORAL_TABLET | Freq: Every day | ORAL | Status: DC

## 2013-11-12 NOTE — Progress Notes (Signed)
Pre-visit discussion using our clinic review tool. No additional management support is needed unless otherwise documented below in the visit note.  

## 2013-11-12 NOTE — Patient Instructions (Signed)
It was good to see you today.  Levaquin antibiotics daily x 1 week and prescription decongestant syrup - Your prescription(s) have been submitted to your pharmacy. Please take as directed and contact our office if you believe you are having problem(s) with the medication(s).  Alternate between ibuprofen and tylenol for aches, pain and fever symptoms as discussed  Hydrate, rest and call if worse or unimproved

## 2013-11-12 NOTE — Progress Notes (Signed)
  Subjective:    HPI  complains of head cold symptoms, ?sinusitus Onset >1 week ago, initially improved then relapsing and worse symptoms  First associated with rhinorrhea, sneezing, sore throat, mild headache and low grade fever Now sinus pressure and mild-mod nasal congestion, yellow-green discharge No relief with OTC meds Precipitated by sick contacts and weather change  Past Medical History  Diagnosis Date  . Vitamin D deficiency   . OBESITY   . Penicillin allergy 11/04/2002  . AMA (advanced maternal age) multigravida 92+   . Vulvovaginal candidiasis 07/2006  . Menometrorrhagia 01/2004, 12/2011    Review of Systems Constitutional: No night sweats, no unexpected weight change Pulmonary: No pleurisy or hemoptysis Cardiovascular: No chest pain or palpitations     Objective:   Physical Exam BP 120/82  Pulse 67  Temp(Src) 97.9 F (36.6 C) (Oral)  Wt 215 lb 12.8 oz (97.886 kg)  SpO2 99% Wt Readings from Last 3 Encounters:  11/12/13 215 lb 12.8 oz (97.886 kg)  03/17/13 238 lb (107.956 kg)  01/13/13 232 lb (105.235 kg)   GEN: mildly ill appearing and audible head congestion HENT: NCAT, mild sinus tenderness bilaterally, nares with scant discharge and turbinate swelling R>L, oropharynx mild erythema and PND, no exudate Eyes: Vision grossly intact, no conjunctivitis Lungs: Clear to auscultation without rhonchi or wheeze, no increased work of breathing Cardiovascular: Regular rate and rhythm, no bilateral edema  Lab Results  Component Value Date   WBC 8.8 03/17/2013   HGB 13.6 03/17/2013   HCT 40.9 03/17/2013   PLT 287.0 03/17/2013   GLUCOSE 90 03/17/2013   CHOL 217* 12/04/2011   TRIG 104.0 12/04/2011   HDL 64.70 12/04/2011   LDLDIRECT 143.2 12/04/2011   ALT 23 11/13/2012   AST 19 11/13/2012   NA 137 03/17/2013   K 4.0 03/17/2013   CL 103 03/17/2013   CREATININE 0.8 03/17/2013   BUN 9 03/17/2013   CO2 27 03/17/2013   TSH 2.15 11/13/2012      Assessment & Plan:  Viral URI > progression to  acute sinusitis Cough, postnasal drip related to above   Empiric antibiotics prescribed due to symptom duration greater than 7 days and progression despite OTC symptomatic care Prescription decongestant suppression - new prescriptions done Symptomatic care with Tylenol or Advil, decongestants, antihistamine, hydration and rest -  Saline irrigation and salt gargle advised as needed

## 2013-11-24 ENCOUNTER — Telehealth: Payer: Self-pay | Admitting: *Deleted

## 2013-11-24 DIAGNOSIS — R51 Headache: Principal | ICD-10-CM

## 2013-11-24 DIAGNOSIS — R519 Headache, unspecified: Secondary | ICD-10-CM

## 2013-11-24 NOTE — Telephone Encounter (Signed)
Will order CT sinus to further eval same - no tx changes recommended

## 2013-11-24 NOTE — Telephone Encounter (Signed)
Notified patient of MD order and response.  [facility] had already scheduled imaging and notified her as well.

## 2013-11-24 NOTE — Telephone Encounter (Signed)
Patient phoned and stated PCP recently diagnosed her with a sinus infection.  States her headaches are NOT improving-are worsening.  Requests advise of further treatment/meds/plan of care.  Please advise.  CB# [phone]

## 2013-12-01 ENCOUNTER — Ambulatory Visit (INDEPENDENT_AMBULATORY_CARE_PROVIDER_SITE_OTHER)
Admission: RE | Admit: 2013-12-01 | Discharge: 2013-12-01 | Disposition: A | Discharge status: Home / Self Care | Payer: 59 | Source: Ambulatory Visit | Attending: Internal Medicine | Admitting: Internal Medicine

## 2013-12-01 DIAGNOSIS — R519 Headache, unspecified: Secondary | ICD-10-CM

## 2013-12-01 DIAGNOSIS — R51 Headache: Secondary | ICD-10-CM

## 2014-01-06 ENCOUNTER — Ambulatory Visit (INDEPENDENT_AMBULATORY_CARE_PROVIDER_SITE_OTHER): Payer: 59 | Admitting: Internal Medicine

## 2014-01-06 ENCOUNTER — Other Ambulatory Visit (INDEPENDENT_AMBULATORY_CARE_PROVIDER_SITE_OTHER): Payer: 59

## 2014-01-06 ENCOUNTER — Encounter: Payer: Self-pay | Admitting: Internal Medicine

## 2014-01-06 VITALS — BP 118/80 | HR 83 | Temp 98.2°F | Ht 64.0 in | Wt 215.0 lb

## 2014-01-06 DIAGNOSIS — M6530 Trigger finger, unspecified finger: Secondary | ICD-10-CM

## 2014-01-06 DIAGNOSIS — Z0000 Encounter for general adult medical examination without abnormal findings: Secondary | ICD-10-CM

## 2014-01-06 DIAGNOSIS — R200 Anesthesia of skin: Secondary | ICD-10-CM

## 2014-01-06 DIAGNOSIS — E669 Obesity, unspecified: Secondary | ICD-10-CM

## 2014-01-06 DIAGNOSIS — R209 Unspecified disturbances of skin sensation: Secondary | ICD-10-CM

## 2014-01-06 DIAGNOSIS — M65312 Trigger thumb, left thumb: Secondary | ICD-10-CM

## 2014-01-06 DIAGNOSIS — G5600 Carpal tunnel syndrome, unspecified upper limb: Secondary | ICD-10-CM

## 2014-01-06 DIAGNOSIS — G5602 Carpal tunnel syndrome, left upper limb: Secondary | ICD-10-CM

## 2014-01-06 DIAGNOSIS — E559 Vitamin D deficiency, unspecified: Secondary | ICD-10-CM

## 2014-01-06 LAB — URINALYSIS, ROUTINE W REFLEX MICROSCOPIC
Bilirubin Urine: NEGATIVE
Ketones, ur: NEGATIVE
Leukocytes, UA: NEGATIVE
Nitrite: NEGATIVE
Specific Gravity, Urine: 1.02 (ref 1.000–1.030)
Total Protein, Urine: NEGATIVE
Urine Glucose: NEGATIVE
Urobilinogen, UA: 0.2 (ref 0.0–1.0)
WBC, UA: NONE SEEN (ref 0–?)
pH: 6 (ref 5.0–8.0)

## 2014-01-06 LAB — BASIC METABOLIC PANEL
BUN: 11 mg/dL (ref 6–23)
CO2: 27 mEq/L (ref 19–32)
Calcium: 9.2 mg/dL (ref 8.4–10.5)
Chloride: 104 mEq/L (ref 96–112)
Creatinine, Ser: 0.8 mg/dL (ref 0.4–1.2)
GFR: 92.51 mL/min (ref >60.00)
Glucose, Bld: 89 mg/dL (ref 70–99)
Potassium: 4.2 mEq/L (ref 3.5–5.1)
Sodium: 138 mEq/L (ref 135–145)

## 2014-01-06 LAB — LIPID PANEL
Cholesterol: 236 mg/dL — ABNORMAL HIGH (ref 0–200)
HDL: 63.1 mg/dL (ref >39.00)
LDL Cholesterol: 150 mg/dL — ABNORMAL HIGH (ref 0–99)
Total CHOL/HDL Ratio: 4
Triglycerides: 115 mg/dL (ref 0.0–149.0)
VLDL: 23 mg/dL (ref 0.0–40.0)

## 2014-01-06 LAB — VITAMIN D 25 HYDROXY (VIT D DEFICIENCY, FRACTURES): Vit D, 25-Hydroxy: 22 ng/mL — ABNORMAL LOW (ref 30–89)

## 2014-01-06 LAB — CBC WITH DIFFERENTIAL/PLATELET
Basophils Absolute: 0 10*3/uL (ref 0.0–0.1)
Basophils Relative: 0.3 % (ref 0.0–3.0)
Eosinophils Absolute: 0.1 10*3/uL (ref 0.0–0.7)
Eosinophils Relative: 0.8 % (ref 0.0–5.0)
HCT: 45.8 % (ref 36.0–46.0)
Hemoglobin: 15.1 g/dL — ABNORMAL HIGH (ref 12.0–15.0)
Lymphocytes Relative: 28.6 % (ref 12.0–46.0)
Lymphs Abs: 2.3 10*3/uL (ref 0.7–4.0)
MCHC: 32.9 g/dL (ref 30.0–36.0)
MCV: 88.8 fl (ref 78.0–100.0)
Monocytes Absolute: 0.6 10*3/uL (ref 0.1–1.0)
Monocytes Relative: 7.9 % (ref 3.0–12.0)
Neutro Abs: 4.9 10*3/uL (ref 1.4–7.7)
Neutrophils Relative %: 62.4 % (ref 43.0–77.0)
Platelets: 336 10*3/uL (ref 150.0–400.0)
RBC: 5.16 Mil/uL — ABNORMAL HIGH (ref 3.87–5.11)
RDW: 14.3 % (ref 11.5–14.6)
WBC: 7.9 10*3/uL (ref 4.5–10.5)

## 2014-01-06 LAB — HEPATIC FUNCTION PANEL
ALT: 17 U/L (ref 0–35)
AST: 14 U/L (ref 0–37)
Albumin: 4 g/dL (ref 3.5–5.2)
Alkaline Phosphatase: 48 U/L (ref 39–117)
Bilirubin, Direct: 0 mg/dL (ref 0.0–0.3)
Total Bilirubin: 0.5 mg/dL (ref 0.3–1.2)
Total Protein: 7.7 g/dL (ref 6.0–8.3)

## 2014-01-06 LAB — VITAMIN B12: Vitamin B-12: 398 pg/mL (ref 211–911)

## 2014-01-06 LAB — HEMOGLOBIN A1C: Hgb A1c MFr Bld: 6.1 % (ref 4.6–6.5)

## 2014-01-06 NOTE — Assessment & Plan Note (Signed)
Reviewed dx and prior tx by gyn Recheck level now

## 2014-01-06 NOTE — Assessment & Plan Note (Signed)
Wt Readings from Last 3 Encounters:  01/06/14 215 lb (97.523 kg)  11/12/13 215 lb 12.8 oz (97.886 kg)  03/17/13 238 lb (107.956 kg)   11/2011 we advised holding off on additional phentermine until headache symptoms had resolved Began phentermine 03/2012 -x3 mo, repeat x 3 mo 08/2012 -  weight has maintained again discussed other potential options for future med tx if needed (belviq/Qysmia) Also reviewed ongoing diet/exercise changes in effort to lose weight

## 2014-01-06 NOTE — Progress Notes (Signed)
Subjective:    Patient ID: April Cooper, female    DOB: 1965/12/13, 48 y.o.   MRN: [id number]  HPI  patient is here today for annual physical. Patient feels well and has no complaints.  Also reviewed chronic medical issues and interval medical events  Past Medical History  Diagnosis Date  . Vitamin D deficiency   . OBESITY   . Penicillin allergy 11/04/2002  . AMA (advanced maternal age) multigravida 15+   . Vulvovaginal candidiasis 07/2006  . Menometrorrhagia 01/2004, 12/2011   Family History  Problem Relation Age of Onset  . Hypertension Mother   . Hypertension Father   . Diabetes Sister   . Diabetes Maternal Aunt   . Hypertension Maternal Grandmother    History  Substance Use Topics  . Smoking status: Never Smoker   . 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  . Alcohol Use: Yes     Comment: rare    Review of Systems  Constitutional: Negative for fever, fatigue and unexpected weight change.  Respiratory: Negative for cough, shortness of breath and wheezing.   Cardiovascular: Negative for chest pain, palpitations and leg swelling.  Gastrointestinal: Negative for nausea, abdominal pain and diarrhea.  Neurological: Positive for numbness (L hand, at night worse than day, exac by overuse). Negative for dizziness, weakness, light-headedness and headaches.  Psychiatric/Behavioral: Negative for dysphoric mood. The patient is not nervous/anxious.   All other systems reviewed and are negative.       Objective:   Physical Exam  BP 118/80  Pulse 83  Temp(Src) 98.2 F (36.8 C) (Oral)  Ht 5\' 4"  (1.626 m)  Wt 215 lb (97.523 kg)  BMI 36.89 kg/m2  SpO2 98% Wt Readings from Last 3 Encounters:  01/06/14 215 lb (97.523 kg)  11/12/13 215 lb 12.8 oz (97.886 kg)  03/17/13 238 lb (107.956 kg)   Constitutional: She is overweight, but appears well-developed and well-nourished. No distress.  HENT:  Head: Normocephalic and atraumatic. Ears: B TMs ok, no erythema or effusion; Nose: Nose normal. Mouth/Throat: Oropharynx is clear and moist. No oropharyngeal exudate.  Eyes: Conjunctivae and EOM are normal. Pupils are equal, round, and reactive to light. No scleral icterus.  Neck: Normal range of motion. Neck supple. No JVD present. No thyromegaly present.  Cardiovascular: Normal rate, regular rhythm and normal heart sounds.  No murmur heard. No BLE edema. Pulmonary/Chest: Effort normal and breath sounds normal. No respiratory distress. She has no wheezes.  Abdominal: Soft. Bowel sounds are normal. She exhibits no distension. There is no tenderness. no masses Musculoskeletal: L wrist with no gross deformity - normal range of motion, ligamentous fx intact; +tinnels with slightly decreased grip strength L compared to R. Also +triggering of L thumb and CMC enlargement L>R Neurological: She is alert and oriented to person, place, and time. No cranial nerve deficit. Coordination, balance, strength, speech and gait are normal.  Skin: Skin is warm and dry. No rash noted. No erythema.  Psychiatric: She has a normal mood and affect. Her behavior is normal. Judgment and thought content normal.    Lab Results  Component Value Date   WBC 8.8 03/17/2013   HGB 13.6 03/17/2013   HCT 40.9 03/17/2013   PLT 287.0 03/17/2013   GLUCOSE 90 03/17/2013   CHOL 217* 12/04/2011   TRIG 104.0 12/04/2011   HDL 64.70 12/04/2011   LDLDIRECT 143.2 12/04/2011   ALT 23 11/13/2012   AST  19 11/13/2012   NA 137 03/17/2013   K 4.0 03/17/2013   CL 103 03/17/2013   CREATININE 0.8 03/17/2013   BUN 9 03/17/2013   CO2 27 03/17/2013   TSH 2.15 11/13/2012    Ct Maxillofacial Ltd Wo Cm  12/01/2013   CLINICAL DATA:  Severe headache behind the right eye  EXAM: CT PARANASAL SINUS LIMITED WITHOUT CONTRAST  TECHNIQUE: Non-contiguous multidetector CT images of the paranasal sinuses were obtained in a single plane without contrast.  COMPARISON:  10/26/2009   FINDINGS: Frontal, ethmoid, maxillary and sphenoid sinuses are entirely clear. No fluid, mucosal thickening, polyp, cyst or mass. No apparent soft tissue lesion.  IMPRESSION: Negative study.  No inflammatory sinus disease.   Electronically Signed   By: Nelson Chimes M.D.   On: 12/01/2013 15:12       Assessment & Plan:   CPX/v70.0 - Patient has been counseled on age-appropriate routine health concerns for screening and prevention. These are reviewed and up-to-date. Immunizations are up-to-date or declined. Labs ordered and reviewed.  L thumb pain "tigger thumb" - suspect this more symptomatic than carpal tunnel at this time (prior NCS in ?2011 with CTS changes but pt declined intervention) - refer to hand specialist now to consider steroid injection -  Numbness - nonspecific hands and toes - check B12, a1c and other CPX labs  Problem List Items Addressed This Visit   Vitamin d deficiency     Reviewed dx and prior tx by gyn Recheck level now    Relevant Orders      Vit D  25 hydroxy (rtn osteoporosis monitoring)    Other Visit Diagnoses   Routine general medical examination at a health care facility    -  Primary    Relevant Orders       Basic metabolic panel       CBC with Differential       Hepatic function panel       Lipid panel       Urinalysis, Routine w reflex microscopic    Trigger thumb of left hand        Relevant Orders       Ambulatory referral to Hand Surgery    Left carpal tunnel syndrome        Relevant Orders       Ambulatory referral to Hand Surgery    Numbness        Relevant Orders       Hemoglobin A1c       Vitamin B12

## 2014-01-06 NOTE — Progress Notes (Signed)
Pre visit review using our clinic review tool, if applicable. No additional management support is needed unless otherwise documented below in the visit note. 

## 2014-01-06 NOTE — Patient Instructions (Addendum)
It was good to see you today.  We have reviewed your prior records including labs and tests today  Health Maintenance reviewed - all recommended immunizations and age-appropriate screenings are up-to-date.  Test(s) ordered today. Your results will be released to [place] (or called to you) after review, usually within 72hours after test completion. If any changes need to be made, you will be notified at that same time.  Medications reviewed and updated, no changes recommended at this time.  we'll make referral to hand specialist for your trigger thumb and carpal tunnel symptoms to consider injection as needed. Our office will contact you regarding appointment(s) once made.  Please schedule followup in 12 months for annual exam and labs, call sooner if problems.   Health Maintenance, Female A healthy lifestyle and preventative care can promote health and wellness.  Maintain regular health, dental, and eye exams.  Eat a healthy diet. Foods like vegetables, fruits, whole grains, low-fat dairy products, and lean protein foods contain the nutrients you need without too many calories. Decrease your intake of foods high in solid fats, added sugars, and salt. Get information about a proper diet from your caregiver, if necessary.  Regular physical exercise is one of the most important things you can do for your health. Most adults should get at least 150 minutes of moderate-intensity exercise (any activity that increases your heart rate and causes you to sweat) each week. In addition, most adults need muscle-strengthening exercises on 2 or more days a week.   Maintain a healthy weight. The body mass index (BMI) is a screening tool to identify possible weight problems. It provides an estimate of body fat based on height and weight. Your caregiver can help determine your BMI, and can help you achieve or maintain a healthy weight. For adults 20 years and older:  A BMI below 18.5 is considered  underweight.  A BMI of 18.5 to 24.9 is normal.  A BMI of 25 to 29.9 is considered overweight.  A BMI of 30 and above is considered obese.  Maintain normal blood lipids and cholesterol by exercising and minimizing your intake of saturated fat. Eat a balanced diet with plenty of fruits and vegetables. Blood tests for lipids and cholesterol should begin at age 32 and be repeated every 5 years. If your lipid or cholesterol levels are high, you are over 50, or you are a high risk for heart disease, you may need your cholesterol levels checked more frequently.Ongoing high lipid and cholesterol levels should be treated with medicines if diet and exercise are not effective.  If you smoke, find out from your caregiver how to quit. If you do not use tobacco, do not start.  Lung cancer screening is recommended for adults aged 60 80 years who are at high risk for developing lung cancer because of a history of smoking. Yearly low-dose computed tomography (CT) is recommended for people who have at least a 30-pack-year history of smoking and are a current smoker or have quit within the past 15 years. A pack year of smoking is smoking an average of 1 pack of cigarettes a day for 1 year (for example: 1 pack a day for 30 years or 2 packs a day for 15 years). Yearly screening should continue until the smoker has stopped smoking for at least 15 years. Yearly screening should also be stopped for people who develop a health problem that would prevent them from having lung cancer treatment.  If you are pregnant, do not drink  alcohol. If you are breastfeeding, be very cautious about drinking alcohol. If you are not pregnant and choose to drink alcohol, do not exceed 1 drink per day. One drink is considered to be 12 ounces (355 mL) of beer, 5 ounces (148 mL) of wine, or 1.5 ounces (44 mL) of liquor.  Avoid use of street drugs. Do not share needles with anyone. Ask for help if you need support or instructions about stopping  the use of drugs.  High blood pressure causes heart disease and increases the risk of stroke. Blood pressure should be checked at least every 1 to 2 years. Ongoing high blood pressure should be treated with medicines, if weight loss and exercise are not effective.  If you are 20 to 49 years old, ask your caregiver if you should take aspirin to prevent strokes.  Diabetes screening involves taking a blood sample to check your fasting blood sugar level. This should be done once every 3 years, after age 59, if you are within normal weight and without risk factors for diabetes. Testing should be considered at a younger age or be carried out more frequently if you are overweight and have at least 1 risk factor for diabetes.  Breast cancer screening is essential preventative care for women. You should practice "breast self-awareness." This means understanding the normal appearance and feel of your breasts and may include breast self-examination. Any changes detected, no matter how small, should be reported to a caregiver. Women in their 10s and 30s should have a clinical breast exam (CBE) by a caregiver as part of a regular health exam every 1 to 3 years. After age 95, women should have a CBE every year. Starting at age 4, women should consider having a mammogram (breast X-ray) every year. Women who have a family history of breast cancer should talk to their caregiver about genetic screening. Women at a high risk of breast cancer should talk to their caregiver about having an MRI and a mammogram every year.  Breast cancer gene (BRCA)-related cancer risk assessment is recommended for women who have family members with BRCA-related cancers. BRCA-related cancers include breast, ovarian, tubal, and peritoneal cancers. Having family members with these cancers may be associated with an increased risk for harmful changes (mutations) in the breast cancer genes BRCA1 and BRCA2. Results of the assessment will determine  the need for genetic counseling and BRCA1 and BRCA2 testing.  The Pap test is a screening test for cervical cancer. Women should have a Pap test starting at age 30. Between ages 12 and 34, Pap tests should be repeated every 2 years. Beginning at age 56, you should have a Pap test every 3 years as long as the past 3 Pap tests have been normal. If you had a hysterectomy for a problem that was not cancer or a condition that could lead to cancer, then you no longer need Pap tests. If you are between ages 34 and 57, and you have had normal Pap tests going back 10 years, you no longer need Pap tests. If you have had past treatment for cervical cancer or a condition that could lead to cancer, you need Pap tests and screening for cancer for at least 20 years after your treatment. If Pap tests have been discontinued, risk factors (such as a new sexual partner) need to be reassessed to determine if screening should be resumed. Some women have medical problems that increase the chance of getting cervical cancer. In these cases, your caregiver may  recommend more frequent screening and Pap tests.  The human papillomavirus (HPV) test is an additional test that may be used for cervical cancer screening. The HPV test looks for the virus that can cause the cell changes on the cervix. The cells collected during the Pap test can be tested for HPV. The HPV test could be used to screen women aged 72 years and older, and should be used in women of any age who have unclear Pap test results. After the age of 59, women should have HPV testing at the same frequency as a Pap test.  Colorectal cancer can be detected and often prevented. Most routine colorectal cancer screening begins at the age of 12 and continues through age 1. However, your caregiver may recommend screening at an earlier age if you have risk factors for colon cancer. On a yearly basis, your caregiver may provide home test kits to check for hidden blood in the stool.  Use of a small camera at the end of a tube, to directly examine the colon (sigmoidoscopy or colonoscopy), can detect the earliest forms of colorectal cancer. Talk to your caregiver about this at age 49, when routine screening begins. Direct examination of the colon should be repeated every 5 to 10 years through age 43, unless early forms of pre-cancerous polyps or small growths are found.  Hepatitis C blood testing is recommended for all people born from [birth year] through 1967 and any individual with known risks for hepatitis C.  Practice safe sex. Use condoms and avoid high-risk sexual practices to reduce the spread of sexually transmitted infections (STIs). Sexually active women aged 42 and younger should be checked for Chlamydia, which is a common sexually transmitted infection. Older women with new or multiple partners should also be tested for Chlamydia. Testing for other STIs is recommended if you are sexually active and at increased risk.  Osteoporosis is a disease in which the bones lose minerals and strength with aging. This can result in serious bone fractures. The risk of osteoporosis can be identified using a bone density scan. Women ages 54 and over and women at risk for fractures or osteoporosis should discuss screening with their caregivers. Ask your caregiver whether you should be taking a calcium supplement or vitamin D to reduce the rate of osteoporosis.  Menopause can be associated with physical symptoms and risks. Hormone replacement therapy is available to decrease symptoms and risks. You should talk to your caregiver about whether hormone replacement therapy is right for you.  Use sunscreen. Apply sunscreen liberally and repeatedly throughout the day. You should seek shade when your shadow is shorter than you. Protect yourself by wearing long sleeves, pants, a wide-brimmed hat, and sunglasses year round, whenever you are outdoors.  Notify your caregiver of new moles or changes in moles,  especially if there is a change in shape or color. Also notify your caregiver if a mole is larger than the size of a pencil eraser.  Stay current with your immunizations. Document Released: 04/10/2011 Document Revised: 01/20/2013 Document Reviewed: 04/10/2011 [facility] Patient Information 2014 [place].

## 2014-04-28 ENCOUNTER — Encounter: Payer: Self-pay | Admitting: Internal Medicine

## 2014-04-28 ENCOUNTER — Ambulatory Visit (INDEPENDENT_AMBULATORY_CARE_PROVIDER_SITE_OTHER): Payer: 59 | Admitting: Internal Medicine

## 2014-04-28 VITALS — BP 126/90 | HR 66 | Temp 98.2°F | Wt 217.8 lb

## 2014-04-28 DIAGNOSIS — T7840XA Allergy, unspecified, initial encounter: Secondary | ICD-10-CM

## 2014-04-28 MED ORDER — PREDNISONE 10 MG PO TABS: ORAL_TABLET | ORAL | Status: DC

## 2014-04-28 NOTE — Patient Instructions (Addendum)
Allergy Tests The measurement of immunoglobulin E is a good method of diagnosing an allergy and determining what the cause is. One method used is the radioallergosorbent test (RAST). If skin testing has been performed and a questionable result is obtained, it can be confirmed by RAST. Two drawbacks to this test are expense and the results are not available immediately. This is a test used to identify particular allergens (something that causes an allergy, like a pollen). It is also sometimes used to monitor the effectiveness of immunotherapy treatment. It is a test which may be used if you have symptoms such as hives, dermatitis, rhinitis (nasal congestion or runny nose), red itchy eyes, asthma, or abdominal pain that your caregiver suspects may be caused by an allergy. The allergen-specific IgE antibody test may also be done to monitor immunotherapy or to see if a child has outgrown an allergy, although it can only be used in a general way; the level of IgE present does not correlate to the severity of an allergic reaction, and someone who has outgrown an allergy may have a positive IgE for many years afterward. PREPARATION FOR TEST  No preparation or fasting is necessary. A blood sample drawn from a vein in your arm. The allergen-specific IgE test can be done using a variety of methods. The method that has been used and studied for the longest time is the RAST. Some doctors refer to all IgE allergy tests as RAST even though this is a specific methodology and may not be the exact test that the lab at their institution is using. Lab values are dependent on many factors and your caregiver will help you interpret your results and what they mean to you. NORMAL FINDINGS   Adult: 0-100 IU/mL  Child 0-23 months: 0-13 IU/mL  Child 2-5 years: 0-56 IU/mL  Child 6-10 years: 0-85 IU/mL Ranges for normal findings may vary among different laboratories and hospitals. You should always check with your doctor after  having lab work or other tests done to discuss the meaning of your test results and whether your values are considered within normal limits. MEANING OF TEST  Your caregiver will go over the test results with you and discuss the importance and meaning of your results, as well as treatment options and the need for additional tests if necessary. OBTAINING THE TEST RESULTS  It is your responsibility to obtain your test results. Ask the lab or department performing the test when and how you will get your results. Document Released: 10/17/2004 Document Revised: 06/19/2012 Document Reviewed: 08/30/2008 [employer] Patient Information 2015 [employer]. This information is not intended to replace advice given to you by your health care provider. Make sure you discuss any questions you have with your health care provider.  

## 2014-04-28 NOTE — Progress Notes (Signed)
Pre visit review using our clinic review tool, if applicable. No additional management support is needed unless otherwise documented below in the visit note. 

## 2014-04-28 NOTE — Progress Notes (Signed)
Subjective:    Patient ID: April Cooper, female    DOB: September 29, 1966, 48 y.o.   MRN: [id number]  HPI  Pt presents to the clinic today with c/o rash. This started 2 days ago. She has noticed welts on her face, neck, arms and torso. The rash is very itchy. She has not had any throat/mouth swelling or difficulty breathing. She is not sure what triggered this. She has never had a reaction like this in the past. She has not tried any new soaps, lotions or detergents. She does report eating a new brand of cashews just prior to the rash, but she has had cashews in the past. She did try benadryl and hydrocortisone cream without much relief.  Review of Systems      Past Medical History  Diagnosis Date  . Vitamin D deficiency   . OBESITY   . Penicillin allergy 11/04/2002  . AMA (advanced maternal age) multigravida 22+   . Vulvovaginal candidiasis 07/2006  . Menometrorrhagia 01/2004, 12/2011    Current Outpatient Prescriptions  Medication Sig Dispense Refill  . hydrochlorothiazide (MICROZIDE) 12.5 MG capsule Take 1 capsule (12.5 mg total) by mouth daily.  10 capsule  0  . ibuprofen (ADVIL,MOTRIN) 200 MG tablet Take 400 mg by mouth every 6 (six) hours as needed for pain.      . Multiple Vitamin (MULTIVITAMIN WITH MINERALS) TABS Take 1 tablet by mouth daily.       No current facility-administered medications for this visit.    Allergies  Allergen Reactions  . Penicillins Rash    Family History  Problem Relation Age of Onset  . Hypertension Mother   . Hypertension Father   . Diabetes Sister   . Diabetes Maternal Aunt   . Hypertension Maternal Grandmother     History   Social History  . Marital Status: Married    Spouse Name: N/A    Number of Children: N/A  . Years of Education: N/A   Occupational History  . Not on file.   Social History Main Topics  . Smoking status: Never Smoker   . Smokeless tobacco: Never Used     Comment: Divorced-lives with 3 children, mom and  G-mom. 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  . Alcohol Use: Yes     Comment: rare  . Drug Use: No  . Sexual Activity: Not Currently   Other Topics Concern  . Not on file   Social History Narrative  . No narrative on file     Constitutional: Denies fever, malaise, fatigue, headache or abrupt weight changes.  Respiratory: Denies difficulty breathing, shortness of breath, cough or sputum production.   Skin: Pt reports rash. Denies lesions or ulcercations.    No other specific complaints in a complete review of systems (except as listed in HPI above).  Objective:   Physical Exam  BP 126/90  Pulse 66  Temp(Src) 98.2 F (36.8 C) (Oral)  Wt 217 lb 12 oz (98.771 kg)  SpO2 98% Wt Readings from Last 3 Encounters:  04/28/14 217 lb 12 oz (98.771 kg)  01/06/14 215 lb (97.523 kg)  11/12/13 215 lb 12.8 oz (97.886 kg)    General: Appears her stated age, well developed, well nourished in NAD. Skin: Warm, dry and intact. Hives noted on face, neck, shoulders and torso. Cardiovascular: Normal rate and rhythm. S1,S2 noted.  No murmur, rubs or gallops noted. No JVD or BLE edema. No carotid bruits noted. Pulmonary/Chest: Normal effort and positive vesicular breath sounds. No respiratory distress.  No wheezes, rales or ronchi noted.    BMET    Component Value Date/Time   NA 138 01/06/2014 0852   K 4.2 01/06/2014 0852   CL 104 01/06/2014 0852   CO2 27 01/06/2014 0852   GLUCOSE 89 01/06/2014 0852   BUN 11 01/06/2014 0852   CREATININE 0.8 01/06/2014 0852   CALCIUM 9.2 01/06/2014 0852   GFRNONAA >90 01/13/2013 1320   GFRAA >90 01/13/2013 1320    Lipid Panel     Component Value Date/Time   CHOL 236* 01/06/2014 0852   TRIG 115.0 01/06/2014 0852   HDL 63.10 01/06/2014 0852   CHOLHDL 4 01/06/2014 0852   VLDL 23.0 01/06/2014 0852   LDLCALC 150* 01/06/2014 0852    CBC    Component Value Date/Time   WBC 7.9 01/06/2014 0852   RBC 5.16* 01/06/2014 0852   HGB 15.1* 01/06/2014 0852   HCT 45.8 01/06/2014 0852   PLT  336.0 01/06/2014 0852   MCV 88.8 01/06/2014 0852   MCH 28.4 01/13/2013 1320   MCHC 32.9 01/06/2014 0852   RDW 14.3 01/06/2014 0852   LYMPHSABS 2.3 01/06/2014 0852   MONOABS 0.6 01/06/2014 0852   EOSABS 0.1 01/06/2014 0852   BASOSABS 0.0 01/06/2014 0852    Hgb A1C Lab Results  Component Value Date   HGBA1C 6.1 01/06/2014         Assessment & Plan:   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

## 2014-04-29 ENCOUNTER — Telehealth: Payer: Self-pay | Admitting: Internal Medicine

## 2014-04-29 DIAGNOSIS — Z91018 Allergy to other foods: Secondary | ICD-10-CM

## 2014-04-29 NOTE — Telephone Encounter (Signed)
Pt request referral to go to the allergist due to the allergic reaction that she had cashew nuts. Please advise, saw Baity 04/28/14 for this problem.

## 2014-04-30 NOTE — Telephone Encounter (Signed)
Pt is aware.  

## 2014-04-30 NOTE — Telephone Encounter (Signed)
Refer done.

## 2014-06-04 ENCOUNTER — Telehealth: Payer: Self-pay | Admitting: Internal Medicine

## 2014-06-04 NOTE — Telephone Encounter (Signed)
Rec'd from [facility] forward 2 pages to Dr. Asa Lente

## 2014-06-26 ENCOUNTER — Telehealth: Payer: Self-pay | Admitting: Internal Medicine

## 2014-06-26 ENCOUNTER — Encounter: Payer: Self-pay | Admitting: Internal Medicine

## 2014-06-26 ENCOUNTER — Ambulatory Visit (INDEPENDENT_AMBULATORY_CARE_PROVIDER_SITE_OTHER)
Admission: RE | Admit: 2014-06-26 | Discharge: 2014-06-26 | Disposition: A | Discharge status: Home / Self Care | Payer: 59 | Source: Ambulatory Visit | Attending: Internal Medicine | Admitting: Internal Medicine

## 2014-06-26 ENCOUNTER — Ambulatory Visit (INDEPENDENT_AMBULATORY_CARE_PROVIDER_SITE_OTHER): Payer: 59 | Admitting: Internal Medicine

## 2014-06-26 VITALS — BP 138/90 | HR 76 | Temp 98.3°F | Resp 16 | Ht 64.0 in | Wt 219.0 lb

## 2014-06-26 DIAGNOSIS — M25569 Pain in unspecified knee: Secondary | ICD-10-CM | POA: Insufficient documentation

## 2014-06-26 DIAGNOSIS — M1712 Unilateral primary osteoarthritis, left knee: Secondary | ICD-10-CM

## 2014-06-26 DIAGNOSIS — M1710 Unilateral primary osteoarthritis, unspecified knee: Secondary | ICD-10-CM

## 2014-06-26 DIAGNOSIS — M25562 Pain in left knee: Secondary | ICD-10-CM

## 2014-06-26 DIAGNOSIS — Z23 Encounter for immunization: Secondary | ICD-10-CM

## 2014-06-26 MED ORDER — IBUPROFEN 600 MG PO TABS: 600.0000 mg | ORAL_TABLET | Freq: Three times a day (TID) | ORAL | Status: DC | PRN

## 2014-06-26 NOTE — Progress Notes (Signed)
   Subjective:    Patient ID: April Cooper, female    DOB: Feb 12, 1966, 48 y.o.   MRN: [id number]  Knee Pain  Incident onset: 3 weeks ago, no specific injury. The incident occurred at home. There was no injury mechanism. The pain is present in the left knee. The quality of the pain is described as aching. The pain is at a severity of 2/10. The pain is mild. The pain has been fluctuating since onset. Pertinent negatives include no inability to bear weight, loss of motion, loss of sensation, muscle weakness, numbness or tingling. The symptoms are aggravated by movement and weight bearing. She has tried nothing for the symptoms. The treatment provided no relief.      Review of Systems  Constitutional: Negative.  Negative for fever, chills, diaphoresis, appetite change and fatigue.  HENT: Negative.   Eyes: Negative.   Respiratory: Negative.  Negative for cough, choking, chest tightness, shortness of breath and stridor.   Cardiovascular: Negative.  Negative for chest pain, palpitations and leg swelling.  Gastrointestinal: Negative.  Negative for nausea, vomiting, abdominal pain, diarrhea, constipation and blood in stool.  Endocrine: Negative.   Genitourinary: Negative.   Musculoskeletal: Positive for arthralgias. Negative for back pain, gait problem, joint swelling, myalgias, neck pain and neck stiffness.  Skin: Negative.  Negative for rash.  Allergic/Immunologic: Negative.   Neurological: Negative.  Negative for dizziness, tingling, tremors, seizures, weakness, light-headedness and numbness.  Hematological: Negative.  Negative for adenopathy. Does not bruise/bleed easily.  Psychiatric/Behavioral: Negative.        Objective:   Physical Exam  Vitals reviewed. Constitutional: She is oriented to person, place, and time. She appears well-developed and well-nourished. No distress.  HENT:  Head: Normocephalic and atraumatic.  Mouth/Throat: Oropharynx is clear and moist. No oropharyngeal  exudate.  Eyes: Conjunctivae are normal. Right eye exhibits no discharge. Left eye exhibits no discharge. No scleral icterus.  Neck: Normal range of motion. Neck supple. No JVD present. No tracheal deviation present. No thyromegaly present.  Cardiovascular: Normal rate, regular rhythm, normal heart sounds and intact distal pulses.  Exam reveals no gallop and no friction rub.   No murmur heard. Pulmonary/Chest: Effort normal and breath sounds normal. No stridor. No respiratory distress. She has no wheezes. She has no rales. She exhibits no tenderness.  Abdominal: Soft. Bowel sounds are normal. She exhibits no distension and no mass. There is no tenderness. There is no rebound and no guarding.  Musculoskeletal: She exhibits no edema.       Left knee: She exhibits deformity (there is mild crepitance). She exhibits normal range of motion, no swelling, no effusion, no ecchymosis, no laceration, no erythema, normal alignment, no LCL laxity, normal patellar mobility and no bony tenderness. Tenderness found. Medial joint line tenderness noted. No lateral joint line, no MCL, no LCL and no patellar tendon tenderness noted.  Lymphadenopathy:    She has no cervical adenopathy.  Neurological: She is oriented to person, place, and time.  Skin: She is not diaphoretic.          Assessment & Plan:

## 2014-06-26 NOTE — Assessment & Plan Note (Signed)
Will start motrin Plain film is normal, will refer to sports medicine to have her meniscus looked at better Start PT

## 2014-06-26 NOTE — Patient Instructions (Signed)

## 2014-06-26 NOTE — Assessment & Plan Note (Signed)
Start motrin  Refer to sports medicine for further evaluation

## 2014-06-26 NOTE — Telephone Encounter (Signed)
Opened in error

## 2014-06-26 NOTE — Progress Notes (Signed)
Pre visit review using our clinic review tool, if applicable. No additional management support is needed unless otherwise documented below in the visit note. 

## 2014-06-29 ENCOUNTER — Telehealth: Payer: Self-pay | Admitting: Internal Medicine

## 2014-06-29 NOTE — Telephone Encounter (Signed)
Left message asking pt to call office please schedule appointment  [facility] with me but please make sure she is aware that I will be part time starting 10/1. She may want to establish with a full time provider.  Previous Messages     ----- Message -----  From: Rowe Clack, MD  Sent: 06/29/2014 8:22 AM  To: Everette Rank, MD   It is ok with me given my reduced availability for clinic. Will defer to Dr Hulen Shouts office to arrange if Dr Deborra Medina accepts.  Thanks   ----- Message -----  From: Darl Householder  Sent: 06/26/2014 10:26 AM  To: Rowe Clack, MD, Lucille Passy, MD   Pt called wanted to transfer from dr Asa Lente to dr Deborra Medina  Is this ok with both of you  Thanks  [phone]

## 2014-07-01 NOTE — Telephone Encounter (Signed)
Spoke with April Cooper  April Cooper is aware dr Deborra Medina will be part time and i gave her the days and hours dr Deborra Medina would be working  April Cooper stated she would call back to schedule an appointment

## 2014-07-10 ENCOUNTER — Ambulatory Visit: Payer: 59 | Admitting: Family Medicine

## 2014-07-16 ENCOUNTER — Ambulatory Visit: Payer: 59 | Admitting: Family Medicine

## 2014-08-10 ENCOUNTER — Encounter: Payer: Self-pay | Admitting: Internal Medicine

## 2014-09-10 ENCOUNTER — Ambulatory Visit: Payer: 59 | Admitting: Internal Medicine

## 2015-01-11 ENCOUNTER — Encounter: Payer: 59 | Admitting: Internal Medicine

## 2015-01-12 ENCOUNTER — Encounter: Payer: 59 | Admitting: Internal Medicine

## 2015-03-24 ENCOUNTER — Ambulatory Visit: Payer: Self-pay | Admitting: Family Medicine

## 2015-03-24 ENCOUNTER — Telehealth: Payer: Self-pay | Admitting: Family Medicine

## 2015-03-24 NOTE — Telephone Encounter (Signed)
Patient did not come in for their appointment today for new pt transfer drom dr Asa Lente.  Please let me know if patient needs to be contacted immediately for follow up or no follow up needed.

## 2015-07-01 ENCOUNTER — Ambulatory Visit (INDEPENDENT_AMBULATORY_CARE_PROVIDER_SITE_OTHER): Payer: BLUE CROSS/BLUE SHIELD | Admitting: Family Medicine

## 2015-07-01 ENCOUNTER — Encounter: Payer: Self-pay | Admitting: Family Medicine

## 2015-07-01 VITALS — BP 130/70 | HR 64 | Temp 97.8°F | Ht 63.25 in | Wt 239.8 lb

## 2015-07-01 DIAGNOSIS — Z01419 Encounter for gynecological examination (general) (routine) without abnormal findings: Secondary | ICD-10-CM

## 2015-07-01 DIAGNOSIS — Z0000 Encounter for general adult medical examination without abnormal findings: Secondary | ICD-10-CM | POA: Diagnosis not present

## 2015-07-01 DIAGNOSIS — E669 Obesity, unspecified: Secondary | ICD-10-CM

## 2015-07-01 LAB — COMPREHENSIVE METABOLIC PANEL
ALT: 19 U/L (ref 0–35)
AST: 15 U/L (ref 0–37)
Albumin: 3.8 g/dL (ref 3.5–5.2)
Alkaline Phosphatase: 47 U/L (ref 39–117)
BUN: 13 mg/dL (ref 6–23)
CO2: 26 mEq/L (ref 19–32)
Calcium: 9.1 mg/dL (ref 8.4–10.5)
Chloride: 105 mEq/L (ref 96–112)
Creatinine, Ser: 0.86 mg/dL (ref 0.40–1.20)
GFR: 89.49 mL/min (ref >60.00)
Glucose, Bld: 116 mg/dL — ABNORMAL HIGH (ref 70–99)
Potassium: 4 mEq/L (ref 3.5–5.1)
Sodium: 138 mEq/L (ref 135–145)
Total Bilirubin: 0.3 mg/dL (ref 0.2–1.2)
Total Protein: 7.2 g/dL (ref 6.0–8.3)

## 2015-07-01 LAB — CBC WITH DIFFERENTIAL/PLATELET
Basophils Absolute: 0 10*3/uL (ref 0.0–0.1)
Basophils Relative: 0.5 % (ref 0.0–3.0)
Eosinophils Absolute: 0.1 10*3/uL (ref 0.0–0.7)
Eosinophils Relative: 2.2 % (ref 0.0–5.0)
HCT: 43.3 % (ref 36.0–46.0)
Hemoglobin: 14.4 g/dL (ref 12.0–15.0)
Lymphocytes Relative: 31.1 % (ref 12.0–46.0)
Lymphs Abs: 2.1 10*3/uL (ref 0.7–4.0)
MCHC: 33.2 g/dL (ref 30.0–36.0)
MCV: 86.9 fl (ref 78.0–100.0)
Monocytes Absolute: 0.7 10*3/uL (ref 0.1–1.0)
Monocytes Relative: 10.2 % (ref 3.0–12.0)
Neutro Abs: 3.9 10*3/uL (ref 1.4–7.7)
Neutrophils Relative %: 56 % (ref 43.0–77.0)
Platelets: 346 10*3/uL (ref 150.0–400.0)
RBC: 4.98 Mil/uL (ref 3.87–5.11)
RDW: 14.1 % (ref 11.5–15.5)
WBC: 6.9 10*3/uL (ref 4.0–10.5)

## 2015-07-01 LAB — LIPID PANEL
Cholesterol: 226 mg/dL — ABNORMAL HIGH (ref 0–200)
HDL: 59.5 mg/dL (ref >39.00)
LDL Cholesterol: 148 mg/dL — ABNORMAL HIGH (ref 0–99)
NonHDL: 166.04
Total CHOL/HDL Ratio: 4
Triglycerides: 91 mg/dL (ref 0.0–149.0)
VLDL: 18.2 mg/dL (ref 0.0–40.0)

## 2015-07-01 LAB — TSH: TSH: 2.07 u[IU]/mL (ref 0.35–4.50)

## 2015-07-01 MED ORDER — PHENTERMINE HCL 30 MG PO CAPS: 30.0000 mg | ORAL_CAPSULE | ORAL | Status: DC

## 2015-07-01 NOTE — Assessment & Plan Note (Signed)
>  30 minutes spent in face to face time with patient, >50% spent in counselling or coordination of care Discussed weight loss plan.  Pt would also like to discuss medication options- discussed phentermine risk benefits, side effects including HTN, pulmonary HTN, stroke.    She would like to start phentermine and lifestyle changes.  Follow up in 1 month.  If BMI < 27 will decrease to half dose x 1 month then stop  Labs today.  Orders Placed This Encounter  Procedures  . CBC with Differential/Platelet  . Comprehensive metabolic panel  . Lipid panel  . TSH

## 2015-07-01 NOTE — Progress Notes (Signed)
Pre visit review using our clinic review tool, if applicable. No additional management support is needed unless otherwise documented below in the visit note. 

## 2015-07-01 NOTE — Progress Notes (Signed)
Subjective:   Patient ID: April Cooper, female    DOB: October 16, 1965, 49 y.o.   MRN: [id number]  April Cooper is a pleasant 49 y.o. year old female who presents to clinic today with Establish Care; Weight Loss; and Joint Swelling  on 07/01/2015  HPI:  Obesity- issues losing and maintaining weight for years.  Years ago went to weight loss clinic and was taking phentermine.  This was effective but it became too far of a drive for her to continue.  Has a new job, not exercising like she should.  She is motivated to lose weight now for her own health and her family's health.  Had OBGYN appt (Dr. Mancel Bale)- a few months ago. Remote h/o ablation.  Due for labs. Wt Readings from Last 3 Encounters:  07/01/15 239 lb 12 oz (108.75 kg)  06/26/14 219 lb (99.338 kg)  04/28/14 217 lb 12 oz (98.771 kg)    Lab Results  Component Value Date   CREATININE 0.8 01/06/2014   Lab Results  Component Value Date   WBC 7.9 01/06/2014   HGB 15.1* 01/06/2014   HCT 45.8 01/06/2014   MCV 88.8 01/06/2014   PLT 336.0 01/06/2014   Lab Results  Component Value Date   NA 138 01/06/2014   K 4.2 01/06/2014   CL 104 01/06/2014   CO2 27 01/06/2014   Lab Results  Component Value Date   CHOL 236* 01/06/2014   HDL 63.10 01/06/2014   LDLCALC 150* 01/06/2014   LDLDIRECT 143.2 12/04/2011   TRIG 115.0 01/06/2014   CHOLHDL 4 01/06/2014   Current Outpatient Prescriptions on File Prior to Visit  Medication Sig Dispense Refill  . ibuprofen (ADVIL,MOTRIN) 600 MG tablet Take 1 tablet (600 mg total) by mouth every 8 (eight) hours as needed for moderate pain. 90 tablet 1  . Multiple Vitamin (MULTIVITAMIN WITH MINERALS) TABS Take 1 tablet by mouth daily.     No current facility-administered medications on file prior to visit.    Allergies  Allergen Reactions  . Penicillins Rash    Past Medical History  Diagnosis Date  . Vitamin D deficiency   . OBESITY   . Penicillin allergy 11/04/2002  . AMA (advanced  maternal age) multigravida 32+   . Vulvovaginal candidiasis 07/2006  . Menometrorrhagia 01/2004, 12/2011    Past Surgical History  Procedure Laterality Date  . Tubal ligation  2004  . Tubal ligation  2004  . Ablation  2013  . Cystoscopy with retrograde pyelogram, ureteroscopy and stent placement Right 01/16/2013    Procedure: [place], URETEROSCOPY AND STENT PLACEMENT;  Surgeon: Dutch Gray, MD;  Location: [facility];  Service: Urology;  Laterality: Right;  . Holmium laser application Right 7/51/0258    Procedure: HOLMIUM LASER APPLICATION;  Surgeon: Dutch Gray, MD;  Location: [facility];  Service: Urology;  Laterality: Right;    Family History  Problem Relation Age of Onset  . Hypertension Mother   . Hypertension Father   . Diabetes Sister   . Diabetes Maternal Aunt   . Hypertension Maternal Grandmother     Social History   Social History  . Marital Status: Married    Spouse Name: N/A  . Number of Children: N/A  . Years of Education: N/A   Occupational History  . Not on file.   Social History Main Topics  . Smoking status: Never Smoker   . Smokeless tobacco: Never Used  . Alcohol Use: Yes     Comment: rare  .  Drug Use: No  . Sexual Activity: Yes   Other Topics Concern  . Not on file   Social History Narrative   The PMH, PSH, Social History, Family History, Medications, and allergies have been reviewed in [facility] and have been updated if relevant.  Review of Systems  Constitutional: Negative.   HENT: Negative.   Eyes: Negative.   Respiratory: Negative.   Cardiovascular: Negative.   Gastrointestinal: Negative.   Endocrine: Negative.   Genitourinary: Negative.   Musculoskeletal: Negative.   Allergic/Immunologic: Negative.   Neurological: Negative.   Hematological: Negative.   Psychiatric/Behavioral: Negative.   All other systems reviewed and are negative.      Objective:    BP 130/70 mmHg  Pulse 64  Temp(Src) 97.8 F (36.6 C) (Oral)   Ht 5' 3.25" (1.607 m)  Wt 239 lb 12 oz (108.75 kg)  BMI 42.11 kg/m2  SpO2 95%   Physical Exam  Constitutional: She is oriented to person, place, and time. She appears well-developed and well-nourished. No distress.  HENT:  Head: Normocephalic and atraumatic.  Eyes: Conjunctivae are normal.  Cardiovascular: Normal rate and regular rhythm.   Pulmonary/Chest: Effort normal and breath sounds normal.  Musculoskeletal: Normal range of motion. She exhibits no edema.  Neurological: She is alert and oriented to person, place, and time. No cranial nerve deficit.  Skin: Skin is warm and dry.  Psychiatric: She has a normal mood and affect. Her behavior is normal. Judgment and thought content normal.  Nursing note and vitals reviewed.         Assessment & Plan:   Obesity No Follow-up on file.

## 2015-07-05 ENCOUNTER — Encounter: Payer: Self-pay | Admitting: *Deleted

## 2015-07-29 ENCOUNTER — Ambulatory Visit: Payer: BLUE CROSS/BLUE SHIELD | Admitting: Family Medicine

## 2015-08-31 ENCOUNTER — Ambulatory Visit (INDEPENDENT_AMBULATORY_CARE_PROVIDER_SITE_OTHER): Payer: BLUE CROSS/BLUE SHIELD | Admitting: Family Medicine

## 2015-08-31 ENCOUNTER — Encounter: Payer: Self-pay | Admitting: Family Medicine

## 2015-08-31 VITALS — BP 134/72 | HR 81 | Temp 97.5°F | Wt 236.2 lb

## 2015-08-31 DIAGNOSIS — E669 Obesity, unspecified: Secondary | ICD-10-CM | POA: Diagnosis not present

## 2015-08-31 MED ORDER — PHENTERMINE HCL 30 MG PO CAPS: 30.0000 mg | ORAL_CAPSULE | ORAL | Status: DC

## 2015-08-31 NOTE — Progress Notes (Signed)
Subjective:   Patient ID: April Cooper, female    DOB: Oct 28, 1965, 49 y.o.   MRN: [id number]  April Cooper is a pleasant 49 y.o. year old female who presents to clinic today with Follow-up  on 08/31/2015  HPI:  Obesity- only started phentermine 2 weeks ago. Already noticing that she has more energy, decreased appetite.  No palpitations, CP or SOB. No HA.  No insomnia.  Wt Readings from Last 3 Encounters:  08/31/15 236 lb 4 oz (107.162 kg)  07/01/15 239 lb 12 oz (108.75 kg)  06/26/14 219 lb (99.338 kg)   Current Outpatient Prescriptions on File Prior to Visit  Medication Sig Dispense Refill  . ibuprofen (ADVIL,MOTRIN) 600 MG tablet Take 1 tablet (600 mg total) by mouth every 8 (eight) hours as needed for moderate pain. 90 tablet 1  . Multiple Vitamin (MULTIVITAMIN WITH MINERALS) TABS Take 1 tablet by mouth daily.     No current facility-administered medications on file prior to visit.    Allergies  Allergen Reactions  . Penicillins Rash    Past Medical History  Diagnosis Date  . Vitamin D deficiency   . OBESITY   . Penicillin allergy 11/04/2002  . AMA (advanced maternal age) multigravida 53+   . Vulvovaginal candidiasis 07/2006  . Menometrorrhagia 01/2004, 12/2011    Past Surgical History  Procedure Laterality Date  . Tubal ligation  2004  . Tubal ligation  2004  . Ablation  2013  . Cystoscopy with retrograde pyelogram, ureteroscopy and stent placement Right 01/16/2013    Procedure: [place], URETEROSCOPY AND STENT PLACEMENT;  Surgeon: Dutch Gray, MD;  Location: [facility];  Service: Urology;  Laterality: Right;  . Holmium laser application Right [id number]    Procedure: HOLMIUM LASER APPLICATION;  Surgeon: Dutch Gray, MD;  [facility]: [facility];  Service: Urology;  Laterality: Right;    Family History  Problem Relation Age of Onset  . Hypertension Mother   . Hypertension Father   . Diabetes Sister   . Diabetes Maternal Aunt   .  Hypertension Maternal Grandmother     Social History   Social History  . Marital Status: Married    Spouse Name: N/A  . Number of Children: N/A  . Years of Education: N/A   Occupational History  . Not on file.   Social History Main Topics  . Smoking status: Never Smoker   . Smokeless tobacco: Never Used  . Alcohol Use: Yes     Comment: rare  . Drug Use: No  . Sexual Activity: Yes   Other Topics Concern  . Not on file   Social History Narrative   The PMH, PSH, Social History, Family History, Medications, and allergies have been reviewed in [facility], and have been updated if relevant.   Review of Systems  Constitutional: Negative.   Eyes: Negative.   Musculoskeletal: Negative.   Neurological: Negative.   Hematological: Negative.   Psychiatric/Behavioral: Negative.   All other systems reviewed and are negative.      Objective:    BP 134/72 mmHg  Pulse 81  Temp(Src) 97.5 F (36.4 C) (Oral)  Wt 236 lb 4 oz (107.162 kg)  SpO2 98%   Physical Exam  Constitutional: She is oriented to person, place, and time. She appears well-developed and well-nourished. No distress.  HENT:  Head: Normocephalic and atraumatic.  Eyes: Conjunctivae are normal.  Cardiovascular: Normal rate.   Pulmonary/Chest: Effort normal.  Musculoskeletal: Normal range of motion.  Neurological: She is  alert and oriented to person, place, and time. No cranial nerve deficit.  Skin: Skin is warm and dry.  Psychiatric: She has a normal mood and affect. Her behavior is normal. Judgment and thought content normal.  Nursing note and vitals reviewed.         Assessment & Plan:   Obesity No Follow-up on file.

## 2015-08-31 NOTE — Progress Notes (Signed)
Pre visit review using our clinic review tool, if applicable. No additional management support is needed unless otherwise documented below in the visit note. 

## 2015-08-31 NOTE — Assessment & Plan Note (Signed)
Improving. Aware of phentermine risks.  She would like to continue. Rx printed for phentermine 30 mg daily and given to pt. Follow up in 3 months.

## 2015-12-02 ENCOUNTER — Telehealth: Payer: Self-pay | Admitting: Family Medicine

## 2015-12-02 ENCOUNTER — Ambulatory Visit: Payer: BLUE CROSS/BLUE SHIELD | Admitting: Family Medicine

## 2015-12-02 NOTE — Telephone Encounter (Signed)
Patient did not come for their scheduled appointment today for 3 month follow up Please let me know if the patient needs to be contacted immediately for follow up or if no follow up is necessary.   ° °

## 2016-01-10 ENCOUNTER — Ambulatory Visit: Payer: BLUE CROSS/BLUE SHIELD | Admitting: Family Medicine

## 2016-01-12 ENCOUNTER — Encounter: Payer: Self-pay | Admitting: Family Medicine

## 2016-01-12 ENCOUNTER — Ambulatory Visit (INDEPENDENT_AMBULATORY_CARE_PROVIDER_SITE_OTHER): Payer: BLUE CROSS/BLUE SHIELD | Admitting: Family Medicine

## 2016-01-12 VITALS — BP 118/70 | HR 69 | Temp 97.3°F | Wt 228.0 lb

## 2016-01-12 DIAGNOSIS — R059 Cough, unspecified: Secondary | ICD-10-CM

## 2016-01-12 DIAGNOSIS — R05 Cough: Secondary | ICD-10-CM | POA: Diagnosis not present

## 2016-01-12 NOTE — Progress Notes (Signed)
Pre visit review using our clinic review tool, if applicable. No additional management support is needed unless otherwise documented below in the visit note. 

## 2016-01-12 NOTE — Progress Notes (Signed)
SUBJECTIVE:  April Cooper is a 50 y.o. female who complains of coryza, congestion, sneezing and dry cough for 8 days. She denies a history of anorexia and chills and denies a history of asthma. Patient denies smoke cigarettes.   Current Outpatient Prescriptions on File Prior to Visit  Medication Sig Dispense Refill  . ibuprofen (ADVIL,MOTRIN) 600 MG tablet Take 1 tablet (600 mg total) by mouth every 8 (eight) hours as needed for moderate pain. 90 tablet 1  . Multiple Vitamin (MULTIVITAMIN WITH MINERALS) TABS Take 1 tablet by mouth daily.    . phentermine 30 MG capsule Take 1 capsule (30 mg total) by mouth every morning. 30 capsule 2   No current facility-administered medications on file prior to visit.    Allergies  Allergen Reactions  . Penicillins Rash    Past Medical History  Diagnosis Date  . Vitamin D deficiency   . OBESITY   . Penicillin allergy 11/04/2002  . AMA (advanced maternal age) multigravida 51+   . Vulvovaginal candidiasis 07/2006  . Menometrorrhagia 01/2004, 12/2011    Past Surgical History  Procedure Laterality Date  . Tubal ligation  2004  . Tubal ligation  2004  . Ablation  2013  . Cystoscopy with retrograde pyelogram, ureteroscopy and stent placement Right 01/16/2013    Procedure: [place], URETEROSCOPY AND STENT PLACEMENT;  Surgeon: Dutch Gray, MD;  Location: [facility];  Service: Urology;  Laterality: Right;  . Holmium laser application Right [id number]    Procedure: HOLMIUM LASER APPLICATION;  Surgeon: Dutch Gray, MD;  [facility]: [facility];  Service: Urology;  Laterality: Right;    Family History  Problem Relation Age of Onset  . Hypertension Mother   . Hypertension Father   . Diabetes Sister   . Diabetes Maternal Aunt   . Hypertension Maternal Grandmother     Social History   Social History  . Marital Status: Married    Spouse Name: N/A  . Number of Children: N/A  . Years of Education: N/A   Occupational History  .  Not on file.   Social History Main Topics  . Smoking status: Never Smoker   . Smokeless tobacco: Never Used  . Alcohol Use: Yes     Comment: rare  . Drug Use: No  . Sexual Activity: Yes   Other Topics Concern  . Not on file   Social History Narrative   The PMH, PSH, Social History, Family History, Medications, and allergies have been reviewed in [facility], and have been updated if relevant.  OBJECTIVE: BP 118/70 mmHg  Pulse 69  Temp(Src) 97.3 F (36.3 C) (Oral)  Wt 228 lb (103.42 kg)  SpO2 100%  She appears well, vital signs are as noted. Ears normal.  Throat and pharynx normal.  Neck supple. No adenopathy in the neck. Nose is congested. Sinuses non tender. The chest is clear, without wheezes or rales.  ASSESSMENT:  viral upper respiratory illness  PLAN: Symptomatic therapy suggested: push fluids, rest and return office visit prn if symptoms persist or worsen. Lack of antibiotic effectiveness discussed with her. Call or return to clinic prn if these symptoms worsen or fail to improve as anticipated.

## 2016-02-29 DIAGNOSIS — K769 Liver disease, unspecified: Secondary | ICD-10-CM | POA: Diagnosis not present

## 2016-02-29 DIAGNOSIS — R1012 Left upper quadrant pain: Secondary | ICD-10-CM | POA: Diagnosis not present

## 2016-02-29 DIAGNOSIS — R109 Unspecified abdominal pain: Secondary | ICD-10-CM | POA: Diagnosis not present

## 2016-06-22 DIAGNOSIS — R635 Abnormal weight gain: Secondary | ICD-10-CM | POA: Diagnosis not present

## 2016-06-22 DIAGNOSIS — N951 Menopausal and female climacteric states: Secondary | ICD-10-CM | POA: Diagnosis not present

## 2016-06-29 DIAGNOSIS — R7303 Prediabetes: Secondary | ICD-10-CM | POA: Diagnosis not present

## 2016-06-29 DIAGNOSIS — E538 Deficiency of other specified B group vitamins: Secondary | ICD-10-CM | POA: Diagnosis not present

## 2016-06-29 DIAGNOSIS — E782 Mixed hyperlipidemia: Secondary | ICD-10-CM | POA: Diagnosis not present

## 2016-06-29 DIAGNOSIS — N951 Menopausal and female climacteric states: Secondary | ICD-10-CM | POA: Diagnosis not present

## 2016-06-30 DIAGNOSIS — E782 Mixed hyperlipidemia: Secondary | ICD-10-CM | POA: Diagnosis not present

## 2016-06-30 DIAGNOSIS — R7303 Prediabetes: Secondary | ICD-10-CM | POA: Diagnosis not present

## 2016-07-07 DIAGNOSIS — R7303 Prediabetes: Secondary | ICD-10-CM | POA: Diagnosis not present

## 2016-07-07 DIAGNOSIS — E782 Mixed hyperlipidemia: Secondary | ICD-10-CM | POA: Diagnosis not present

## 2016-07-07 DIAGNOSIS — E538 Deficiency of other specified B group vitamins: Secondary | ICD-10-CM | POA: Diagnosis not present

## 2016-07-13 DIAGNOSIS — E782 Mixed hyperlipidemia: Secondary | ICD-10-CM | POA: Diagnosis not present

## 2016-07-13 DIAGNOSIS — E039 Hypothyroidism, unspecified: Secondary | ICD-10-CM | POA: Diagnosis not present

## 2016-07-13 DIAGNOSIS — E538 Deficiency of other specified B group vitamins: Secondary | ICD-10-CM | POA: Diagnosis not present

## 2016-07-20 DIAGNOSIS — E782 Mixed hyperlipidemia: Secondary | ICD-10-CM | POA: Diagnosis not present

## 2016-07-20 DIAGNOSIS — R7303 Prediabetes: Secondary | ICD-10-CM | POA: Diagnosis not present

## 2016-07-20 DIAGNOSIS — E538 Deficiency of other specified B group vitamins: Secondary | ICD-10-CM | POA: Diagnosis not present

## 2016-07-26 DIAGNOSIS — Z23 Encounter for immunization: Secondary | ICD-10-CM | POA: Diagnosis not present

## 2016-07-27 DIAGNOSIS — E538 Deficiency of other specified B group vitamins: Secondary | ICD-10-CM | POA: Diagnosis not present

## 2016-07-27 DIAGNOSIS — E782 Mixed hyperlipidemia: Secondary | ICD-10-CM | POA: Diagnosis not present

## 2016-07-27 DIAGNOSIS — R7303 Prediabetes: Secondary | ICD-10-CM | POA: Diagnosis not present

## 2016-08-01 DIAGNOSIS — E782 Mixed hyperlipidemia: Secondary | ICD-10-CM | POA: Diagnosis not present

## 2016-08-01 DIAGNOSIS — E538 Deficiency of other specified B group vitamins: Secondary | ICD-10-CM | POA: Diagnosis not present

## 2016-08-01 DIAGNOSIS — R7303 Prediabetes: Secondary | ICD-10-CM | POA: Diagnosis not present

## 2016-08-10 DIAGNOSIS — E782 Mixed hyperlipidemia: Secondary | ICD-10-CM | POA: Diagnosis not present

## 2016-08-10 DIAGNOSIS — E538 Deficiency of other specified B group vitamins: Secondary | ICD-10-CM | POA: Diagnosis not present

## 2016-08-10 DIAGNOSIS — R7303 Prediabetes: Secondary | ICD-10-CM | POA: Diagnosis not present

## 2016-08-18 DIAGNOSIS — E782 Mixed hyperlipidemia: Secondary | ICD-10-CM | POA: Diagnosis not present

## 2016-08-18 DIAGNOSIS — R7303 Prediabetes: Secondary | ICD-10-CM | POA: Diagnosis not present

## 2016-08-18 DIAGNOSIS — E538 Deficiency of other specified B group vitamins: Secondary | ICD-10-CM | POA: Diagnosis not present

## 2016-08-24 DIAGNOSIS — E538 Deficiency of other specified B group vitamins: Secondary | ICD-10-CM | POA: Diagnosis not present

## 2016-08-29 DIAGNOSIS — E039 Hypothyroidism, unspecified: Secondary | ICD-10-CM | POA: Diagnosis not present

## 2016-08-29 DIAGNOSIS — E538 Deficiency of other specified B group vitamins: Secondary | ICD-10-CM | POA: Diagnosis not present

## 2016-08-29 DIAGNOSIS — E782 Mixed hyperlipidemia: Secondary | ICD-10-CM | POA: Diagnosis not present

## 2016-09-07 DIAGNOSIS — E538 Deficiency of other specified B group vitamins: Secondary | ICD-10-CM | POA: Diagnosis not present

## 2016-09-07 DIAGNOSIS — R7303 Prediabetes: Secondary | ICD-10-CM | POA: Diagnosis not present

## 2016-09-07 DIAGNOSIS — E782 Mixed hyperlipidemia: Secondary | ICD-10-CM | POA: Diagnosis not present

## 2016-09-15 DIAGNOSIS — R7303 Prediabetes: Secondary | ICD-10-CM | POA: Diagnosis not present

## 2016-09-15 DIAGNOSIS — E6609 Other obesity due to excess calories: Secondary | ICD-10-CM | POA: Diagnosis not present

## 2016-09-15 DIAGNOSIS — E538 Deficiency of other specified B group vitamins: Secondary | ICD-10-CM | POA: Diagnosis not present

## 2016-09-15 DIAGNOSIS — E782 Mixed hyperlipidemia: Secondary | ICD-10-CM | POA: Diagnosis not present

## 2016-09-28 DIAGNOSIS — E6609 Other obesity due to excess calories: Secondary | ICD-10-CM | POA: Diagnosis not present

## 2016-09-28 DIAGNOSIS — E039 Hypothyroidism, unspecified: Secondary | ICD-10-CM | POA: Diagnosis not present

## 2016-09-28 DIAGNOSIS — E538 Deficiency of other specified B group vitamins: Secondary | ICD-10-CM | POA: Diagnosis not present

## 2016-09-28 DIAGNOSIS — E782 Mixed hyperlipidemia: Secondary | ICD-10-CM | POA: Diagnosis not present

## 2016-10-12 DIAGNOSIS — E6609 Other obesity due to excess calories: Secondary | ICD-10-CM | POA: Diagnosis not present

## 2016-10-12 DIAGNOSIS — Z124 Encounter for screening for malignant neoplasm of cervix: Secondary | ICD-10-CM | POA: Diagnosis not present

## 2016-10-12 DIAGNOSIS — E039 Hypothyroidism, unspecified: Secondary | ICD-10-CM | POA: Diagnosis not present

## 2016-10-12 DIAGNOSIS — Z6841 Body Mass Index (BMI) 40.0 and over, adult: Secondary | ICD-10-CM | POA: Diagnosis not present

## 2016-10-12 DIAGNOSIS — E538 Deficiency of other specified B group vitamins: Secondary | ICD-10-CM | POA: Diagnosis not present

## 2016-10-12 DIAGNOSIS — E782 Mixed hyperlipidemia: Secondary | ICD-10-CM | POA: Diagnosis not present

## 2016-10-12 DIAGNOSIS — Z1231 Encounter for screening mammogram for malignant neoplasm of breast: Secondary | ICD-10-CM | POA: Diagnosis not present

## 2016-10-12 DIAGNOSIS — Z01419 Encounter for gynecological examination (general) (routine) without abnormal findings: Secondary | ICD-10-CM | POA: Diagnosis not present

## 2016-10-19 DIAGNOSIS — E538 Deficiency of other specified B group vitamins: Secondary | ICD-10-CM | POA: Diagnosis not present

## 2016-11-02 DIAGNOSIS — E538 Deficiency of other specified B group vitamins: Secondary | ICD-10-CM | POA: Diagnosis not present

## 2017-01-02 ENCOUNTER — Encounter: Payer: Self-pay | Admitting: Family Medicine

## 2017-01-02 ENCOUNTER — Ambulatory Visit (INDEPENDENT_AMBULATORY_CARE_PROVIDER_SITE_OTHER): Payer: PRIVATE HEALTH INSURANCE | Admitting: Family Medicine

## 2017-01-02 DIAGNOSIS — M545 Low back pain: Secondary | ICD-10-CM | POA: Diagnosis not present

## 2017-01-02 DIAGNOSIS — M542 Cervicalgia: Secondary | ICD-10-CM

## 2017-01-02 MED ORDER — CYCLOBENZAPRINE HCL 5 MG PO TABS: 5.0000 mg | ORAL_TABLET | Freq: Three times a day (TID) | ORAL | 1 refills | Status: DC | PRN

## 2017-01-02 NOTE — Patient Instructions (Signed)
Great to see you. Apply heat, get massage, ok to use flexeril as needed for muscle spasms as directed.
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
## 2017-01-04 ENCOUNTER — Other Ambulatory Visit: Payer: Self-pay | Admitting: Family Medicine

## 2017-01-04 ENCOUNTER — Other Ambulatory Visit: Payer: BLUE CROSS/BLUE SHIELD

## 2017-01-04 DIAGNOSIS — Z01419 Encounter for gynecological examination (general) (routine) without abnormal findings: Secondary | ICD-10-CM

## 2017-01-11 ENCOUNTER — Ambulatory Visit (INDEPENDENT_AMBULATORY_CARE_PROVIDER_SITE_OTHER): Payer: BLUE CROSS/BLUE SHIELD | Admitting: Family Medicine

## 2017-01-11 ENCOUNTER — Encounter: Payer: Self-pay | Admitting: Internal Medicine

## 2017-01-11 VITALS — BP 130/86 | HR 68 | Ht 63.5 in | Wt 241.8 lb

## 2017-01-11 DIAGNOSIS — Z0000 Encounter for general adult medical examination without abnormal findings: Secondary | ICD-10-CM

## 2017-01-11 DIAGNOSIS — E559 Vitamin D deficiency, unspecified: Secondary | ICD-10-CM

## 2017-01-11 DIAGNOSIS — Z01419 Encounter for gynecological examination (general) (routine) without abnormal findings: Secondary | ICD-10-CM

## 2017-01-11 DIAGNOSIS — Z1211 Encounter for screening for malignant neoplasm of colon: Secondary | ICD-10-CM

## 2017-01-11 LAB — CBC WITH DIFFERENTIAL/PLATELET
Basophils Absolute: 0 10*3/uL (ref 0.0–0.1)
Basophils Relative: 0.6 % (ref 0.0–3.0)
Eosinophils Absolute: 0.1 10*3/uL (ref 0.0–0.7)
Eosinophils Relative: 1 % (ref 0.0–5.0)
HCT: 42.3 % (ref 36.0–46.0)
Hemoglobin: 13.9 g/dL (ref 12.0–15.0)
Lymphocytes Relative: 28.7 % (ref 12.0–46.0)
Lymphs Abs: 2.5 10*3/uL (ref 0.7–4.0)
MCHC: 32.9 g/dL (ref 30.0–36.0)
MCV: 86.5 fl (ref 78.0–100.0)
Monocytes Absolute: 0.8 10*3/uL (ref 0.1–1.0)
Monocytes Relative: 9.1 % (ref 3.0–12.0)
Neutro Abs: 5.2 10*3/uL (ref 1.4–7.7)
Neutrophils Relative %: 60.6 % (ref 43.0–77.0)
Platelets: 339 10*3/uL (ref 150.0–400.0)
RBC: 4.89 Mil/uL (ref 3.87–5.11)
RDW: 14.4 % (ref 11.5–15.5)
WBC: 8.5 10*3/uL (ref 4.0–10.5)

## 2017-01-11 LAB — COMPREHENSIVE METABOLIC PANEL
ALT: 18 U/L (ref 0–35)
AST: 15 U/L (ref 0–37)
Albumin: 3.8 g/dL (ref 3.5–5.2)
Alkaline Phosphatase: 52 U/L (ref 39–117)
BUN: 9 mg/dL (ref 6–23)
CO2: 29 mEq/L (ref 19–32)
Calcium: 9.1 mg/dL (ref 8.4–10.5)
Chloride: 105 mEq/L (ref 96–112)
Creatinine, Ser: 0.73 mg/dL (ref 0.40–1.20)
GFR: 107.47 mL/min (ref >60.00)
Glucose, Bld: 112 mg/dL — ABNORMAL HIGH (ref 70–99)
Potassium: 3.9 mEq/L (ref 3.5–5.1)
Sodium: 138 mEq/L (ref 135–145)
Total Bilirubin: 0.3 mg/dL (ref 0.2–1.2)
Total Protein: 7.3 g/dL (ref 6.0–8.3)

## 2017-01-11 LAB — TSH: TSH: 4.41 u[IU]/mL (ref 0.35–4.50)

## 2017-01-11 LAB — LIPID PANEL
Cholesterol: 251 mg/dL — ABNORMAL HIGH (ref 0–200)
HDL: 57.7 mg/dL (ref >39.00)
NonHDL: 193.06
Total CHOL/HDL Ratio: 4
Triglycerides: 211 mg/dL — ABNORMAL HIGH (ref 0.0–149.0)
VLDL: 42.2 mg/dL — ABNORMAL HIGH (ref 0.0–40.0)

## 2017-01-11 LAB — LDL CHOLESTEROL, DIRECT: Direct LDL: 163 mg/dL

## 2017-01-11 NOTE — Assessment & Plan Note (Signed)
Reviewed preventive care protocols, scheduled due services, and updated immunizations Discussed nutrition, exercise, diet, and healthy lifestyle.  Refer to GI for screening colonoscopy.  Discussed daily exercise and rejoining weight watchers.  Labs today. Orders Placed This Encounter  Procedures  . HIV antibody (with reflex)  . Hepatitis C Antibody  . Ambulatory referral to Gastroenterology    Referral Priority:   Routine    Referral Type:   Consultation    Referral Reason:   Specialty Services Required    Number of Visits Requested:   1

## 2017-01-11 NOTE — Progress Notes (Signed)
Subjective:   Patient ID: [name], female    DOB: 1966/02/28, 51 y.o.   MRN: [id number]  April Cooper is a pleasant 51 y.o. year old female who presents to clinic today with Annual Exam (no pap no mammo)  on 01/11/2017  HPI:  G4P3 s/p ablation Mammogram and Pap done 2 months ago- has GYN Has never had a colonoscopy. Denies family history of colon cancer or changes in her bowel habits or blood in her stool.  Current Outpatient Prescriptions on File Prior to Visit  Medication Sig Dispense Refill  . cyclobenzaprine (FLEXERIL) 5 MG tablet Take 1 tablet (5 mg total) by mouth 3 (three) times daily as needed for muscle spasms. 30 tablet 1  . ibuprofen (ADVIL,MOTRIN) 600 MG tablet Take 1 tablet (600 mg total) by mouth every 8 (eight) hours as needed for moderate pain. 90 tablet 1  . Multiple Vitamin (MULTIVITAMIN WITH MINERALS) TABS Take 1 tablet by mouth daily.     No current facility-administered medications on file prior to visit.     Allergies  Allergen Reactions  . Penicillins Rash    Past Medical History:  Diagnosis Date  . AMA (advanced maternal age) multigravida 31+   . Menometrorrhagia 01/2004, 12/2011  . OBESITY   . Penicillin allergy 11/04/2002  . Vitamin D deficiency   . Vulvovaginal candidiasis 07/2006    Past Surgical History:  Procedure Laterality Date  . ABLATION  2013  . CYSTOSCOPY WITH RETROGRADE PYELOGRAM, URETEROSCOPY AND STENT PLACEMENT Right 01/16/2013   Procedure: CYSTOSCOPY WITH RETROGRADE PYELOGRAM, URETEROSCOPY AND STENT PLACEMENT;  Surgeon: Dutch Gray, MD;  [facility];  Service: Urology;  Laterality: Right;  . HOLMIUM LASER APPLICATION Right 4/85/4627   Procedure: HOLMIUM LASER APPLICATION;  Surgeon: Dutch Gray, MD;  [facility];  Service: Urology;  Laterality: Right;  . TUBAL LIGATION  2004  . TUBAL LIGATION  2004    Family History  Problem Relation Age of Onset  . Hypertension Mother   . Hypertension Father   . Diabetes  Sister   . Diabetes Maternal Aunt   . Hypertension Maternal Grandmother     Social History   Social History  . Marital status: Married    Spouse name: N/A  . Number of children: N/A  . Years of education: N/A   Occupational History  . Not on file.   Social History Main Topics  . Smoking status: Never Smoker  . Smokeless tobacco: Never Used  . Alcohol use Yes     Comment: rare  . Drug use: No  . Sexual activity: Yes   Other Topics Concern  . Not on file   Social History Narrative  . No narrative on file   The PMH, PSH, Social History, Family History, Medications, and allergies have been reviewed in [facility], and have been updated if relevant.   Review of Systems  Constitutional: Negative.   HENT: Negative.   Eyes: Negative.   Respiratory: Negative.   Cardiovascular: Negative.   Gastrointestinal: Negative.   Endocrine: Negative.   Genitourinary: Negative.   Musculoskeletal: Negative.   Skin: Negative.   Allergic/Immunologic: Negative.   Neurological: Negative.   Hematological: Negative.   Psychiatric/Behavioral: Negative.   All other systems reviewed and are negative.      Objective:    BP 130/86   Pulse 68   Ht 5' 3.5" (1.613 m)   Wt 241 lb 12 oz (109.7 kg)   SpO2 98%   BMI 42.15 kg/m  Physical Exam   General:  Well-developed,well-nourished,in no acute distress; alert,appropriate and cooperative throughout examination Head:  normocephalic and atraumatic.   Eyes:  vision grossly intact, PERRL Ears:  R ear normal and L ear normal externally, TMs clear bilaterally Nose:  no external deformity.   Mouth:  good dentition.   Neck:  No deformities, masses, or tenderness noted. Lungs:  Normal respiratory effort, chest expands symmetrically. Lungs are clear to auscultation, no crackles or wheezes. Heart:  Normal rate and regular rhythm. S1 and S2 normal without gallop, murmur, click, rub or other extra sounds. Abdomen:  Bowel sounds positive,abdomen soft  and non-tender without masses, organomegaly or hernias noted. Msk:  No deformity or scoliosis noted of thoracic or lumbar spine.   Extremities:  No clubbing, cyanosis, edema, or deformity noted with normal full range of motion of all joints.   Neurologic:  alert & oriented X3 and gait normal.   Skin:  Intact without suspicious lesions or rashes Cervical Nodes:  No lymphadenopathy noted Axillary Nodes:  No palpable lymphadenopathy Psych:  Cognition and judgment appear intact. Alert and cooperative with normal attention span and concentration. No apparent delusions, illusions, hallucinations       Assessment & Plan:   Vitamin D deficiency  Well woman exam No Follow-up on file.

## 2017-01-11 NOTE — Patient Instructions (Addendum)
Great to see you!  We will call you with your lab results and you can view them online.  We will call you with your GI appointment.

## 2017-01-12 LAB — HEPATITIS C ANTIBODY: HCV Ab: NEGATIVE

## 2017-01-14 LAB — HIV ANTIBODY (ROUTINE TESTING W REFLEX): HIV 1&2 Ab, 4th Generation: REACTIVE — AB

## 2017-01-15 ENCOUNTER — Telehealth: Payer: Self-pay | Admitting: Infectious Disease

## 2017-01-15 LAB — HIV 1/2 CONFIRMATION
HIV-1 antibody: NEGATIVE
HIV-2 Ab: NEGATIVE

## 2017-01-15 NOTE — Telephone Encounter (Signed)
Patient appears to have screened positive on the initial HIV EIA/p24 of "fourth generation test"   HIV 1, 2 confirmatory tests are negative so this is   Either = acute HIV IF HIV QUAL RNA IS POSITIVE  Or it is a false positive if the QUAL IS NEGATIVE  If this is an acute patient we would want to get her into our clinic to be seen asap, and she would also be candidate to be in a study for ACUTE HIV infection in which she would among other benefits get a first line ARV [place] for a year for free  I do not know if you have already reached out to the patient  We have sometimes had problems seeing the qual RNA turn around very fast and if there is concern a HIV QUANT RNA may come back faster

## 2017-01-18 LAB — HIV-1 RNA, QUALITATIVE, TMA: HIV-1 RNA, Qualitative, TMA: NOT DETECTED

## 2017-01-22 ENCOUNTER — Other Ambulatory Visit: Payer: Self-pay

## 2017-01-22 NOTE — Telephone Encounter (Signed)
Opened in error

## 2017-01-30 ENCOUNTER — Telehealth: Payer: Self-pay

## 2017-01-30 DIAGNOSIS — E785 Hyperlipidemia, unspecified: Secondary | ICD-10-CM

## 2017-01-30 NOTE — Telephone Encounter (Signed)
Pt called back saying she is willing to try a cholesterol medication. I told her that it would be Monday before Dr Deborra Medina will be back to address it. She was fine with that.

## 2017-02-05 MED ORDER — ROSUVASTATIN CALCIUM 5 MG PO TABS: 5.0000 mg | ORAL_TABLET | Freq: Every day | ORAL | 3 refills | Status: DC

## 2017-02-05 NOTE — Telephone Encounter (Signed)
Spoke to pt. Made fasting lab appt for 04-06-17

## 2017-02-05 NOTE — Telephone Encounter (Signed)
Crestor eRx sent to pharmacy on file and follow up labs ordered- please schedule lab visit for 8 weeks from now.

## 2017-02-23 ENCOUNTER — Ambulatory Visit (AMBULATORY_SURGERY_CENTER): Payer: Self-pay

## 2017-02-23 VITALS — Ht 63.5 in | Wt 244.2 lb

## 2017-02-23 DIAGNOSIS — Z1211 Encounter for screening for malignant neoplasm of colon: Secondary | ICD-10-CM

## 2017-02-23 MED ORDER — SUPREP BOWEL PREP KIT 17.5-3.13-1.6 GM/177ML PO SOLN: 1.0000 | Freq: Once | ORAL | 0 refills | Status: AC

## 2017-02-23 NOTE — Progress Notes (Signed)
No allergies to eggs or soy No diet meds No home oxygen No past problems with anesthesia  Registered emmi 

## 2017-03-09 ENCOUNTER — Encounter: Payer: Self-pay | Admitting: Internal Medicine

## 2017-03-09 ENCOUNTER — Ambulatory Visit (AMBULATORY_SURGERY_CENTER): Payer: BLUE CROSS/BLUE SHIELD | Admitting: Internal Medicine

## 2017-03-09 VITALS — BP 147/76 | HR 56 | Temp 96.9°F | Resp 20 | Ht 63.5 in | Wt 241.0 lb

## 2017-03-09 DIAGNOSIS — K635 Polyp of colon: Secondary | ICD-10-CM | POA: Diagnosis not present

## 2017-03-09 DIAGNOSIS — K6389 Other specified diseases of intestine: Secondary | ICD-10-CM | POA: Diagnosis not present

## 2017-03-09 DIAGNOSIS — D120 Benign neoplasm of cecum: Secondary | ICD-10-CM

## 2017-03-09 DIAGNOSIS — D126 Benign neoplasm of colon, unspecified: Secondary | ICD-10-CM | POA: Diagnosis not present

## 2017-03-09 DIAGNOSIS — Z1211 Encounter for screening for malignant neoplasm of colon: Secondary | ICD-10-CM

## 2017-03-09 DIAGNOSIS — Z1212 Encounter for screening for malignant neoplasm of rectum: Secondary | ICD-10-CM | POA: Diagnosis not present

## 2017-03-09 DIAGNOSIS — D123 Benign neoplasm of transverse colon: Secondary | ICD-10-CM

## 2017-03-09 DIAGNOSIS — D122 Benign neoplasm of ascending colon: Secondary | ICD-10-CM

## 2017-03-09 MED ORDER — SODIUM CHLORIDE 0.9 % IV SOLN: 500.0000 mL | INTRAVENOUS | Status: DC

## 2017-03-09 NOTE — Progress Notes (Signed)
To recovery, report to Jones, RN, VSS 

## 2017-03-09 NOTE — Patient Instructions (Signed)
YOU HAD AN ENDOSCOPIC PROCEDURE TODAY AT [facility]:   Refer to the procedure report that was given to you for any specific questions about what was found during the examination.  If the procedure report does not answer your questions, please call your gastroenterologist to clarify.  If you requested that your care partner not be given the details of your procedure findings, then the procedure report has been included in a sealed envelope for you to review at your convenience later.  YOU SHOULD EXPECT: Some feelings of bloating in the abdomen. Passage of more gas than usual.  Walking can help get rid of the air that was put into your GI tract during the procedure and reduce the bloating. If you had a lower endoscopy (such as a colonoscopy or flexible sigmoidoscopy) you may notice spotting of blood in your stool or on the toilet paper. If you underwent a bowel prep for your procedure, you may not have a normal bowel movement for a few days.  Please Note:  You might notice some irritation and congestion in your nose or some drainage.  This is from the oxygen used during your procedure.  There is no need for concern and it should clear up in a day or so.  SYMPTOMS TO REPORT IMMEDIATELY:   Following lower endoscopy (colonoscopy or flexible sigmoidoscopy):  Excessive amounts of blood in the stool  Significant tenderness or worsening of abdominal pains  Swelling of the abdomen that is new, acute  Fever of 100F or higher  For urgent or emergent issues, a gastroenterologist can be reached at any hour by calling [phone].   DIET:  We do recommend a small meal at first, but then you may proceed to your regular diet.  Drink plenty of fluids but you should avoid alcoholic beverages for 24 hours.  ACTIVITY:  You should plan to take it easy for the rest of today and you should NOT DRIVE or use heavy machinery until tomorrow (because of the sedation medicines used during the test).     FOLLOW UP: Our staff will call the number listed on your records the next business day following your procedure to check on you and address any questions or concerns that you may have regarding the information given to you following your procedure. If we do not reach you, we will leave a message.  However, if you are feeling well and you are not experiencing any problems, there is no need to return our call.  We will assume that you have returned to your regular daily activities without incident.  If any biopsies were taken you will be contacted by phone or by letter within the next 1-3 weeks.  Please call us at [phone] if you have not heard about the biopsies in 3 weeks.   Await for biopsy results to determined next repeat Colonoscopy screening Polyps (handout given) Diverticulosis (handout given)   SIGNATURES/CONFIDENTIALITY: You and/or your care partner have signed paperwork which will be entered into your electronic medical record.  These signatures attest to the fact that that the information above on your After Visit Summary has been reviewed and is understood.  Full responsibility of the confidentiality of this discharge information lies with you and/or your care-partner.

## 2017-03-09 NOTE — Progress Notes (Signed)
Called to room to assist during endoscopic procedure.  Patient ID and intended procedure confirmed with present staff. Received instructions for my participation in the procedure from the performing physician.  

## 2017-03-09 NOTE — Progress Notes (Signed)
Pt's states no medical or surgical changes since previsit  

## 2017-03-09 NOTE — Op Note (Signed)
[facility] Patient Name: April Cooper Procedure Date: 03/09/2017 1:28 PM MRN: [id number] Endoscopist: Jerene Bears , MD Age: 51 Referring MD:  Date of Birth: 06-Aug-1966 Gender: Female Account #: [id number] Procedure:                Colonoscopy Indications:              Screening for colorectal malignant neoplasm Medicines:                Monitored Anesthesia Care Procedure:                Pre-Anesthesia Assessment:                           - Prior to the procedure, a History and Physical                            was performed, and patient medications and                            allergies were reviewed. The patient's tolerance of                            previous anesthesia was also reviewed. The risks                            and benefits of the procedure and the sedation                            options and risks were discussed with the patient.                            All questions were answered, and informed consent                            was obtained. Prior Anticoagulants: The patient has                            taken no previous anticoagulant or antiplatelet                            agents. ASA Grade Assessment: III - A patient with                            severe systemic disease. After reviewing the risks                            and benefits, the patient was deemed in                            satisfactory condition to undergo the procedure.                           After obtaining informed consent, the colonoscope  was passed under direct vision. Throughout the                            procedure, the patient's blood pressure, pulse, and                            oxygen saturations were monitored continuously. The                            Colonoscope was introduced through the anus and                            advanced to the the cecum, identified by                            appendiceal orifice and  ileocecal valve. The                            colonoscopy was performed without difficulty. The                            patient tolerated the procedure well. The quality                            of the bowel preparation was good. The ileocecal                            valve, appendiceal orifice, and rectum were                            photographed. Scope In: 1:41:22 PM Scope Out: 1:55:19 PM Scope Withdrawal Time: 0 hours 11 minutes 16 seconds  Total Procedure Duration: 0 hours 13 minutes 57 seconds  Findings:                 The digital rectal exam was normal.                           A 2 mm polyp was found in the cecum. The polyp was                            sessile. The polyp was removed with a cold biopsy                            forceps. Resection and retrieval were complete.                           A 5 mm polyp was found in the ascending colon. The                            polyp was sessile. The polyp was removed with a                            cold snare. Resection  and retrieval were complete.                           A 4 mm polyp was found in the transverse colon. The                            polyp was sessile. The polyp was removed with a                            cold snare. Resection and retrieval were complete.                           Multiple small and large-mouthed diverticula were                            found in the sigmoid colon, descending colon and                            ascending colon.                           The retroflexed view of the distal rectum and anal                            verge was normal and showed no anal or rectal                            abnormalities. Complications:            No immediate complications. Estimated Blood Loss:     Estimated blood loss was minimal. Impression:               - One 2 mm polyp in the cecum, removed with a cold                            biopsy forceps. Resected and retrieved.                            - One 5 mm polyp in the ascending colon, removed                            with a cold snare. Resected and retrieved.                           - One 4 mm polyp in the transverse colon, removed                            with a cold snare. Resected and retrieved.                           - Moderate diverticulosis in the sigmoid colon, in                            the descending colon and in the  ascending colon.                           - The distal rectum and anal verge are normal on                            retroflexion view. Recommendation:           - Patient has a contact number available for                            emergencies. The signs and symptoms of potential                            delayed complications were discussed with the                            patient. Return to normal activities tomorrow.                            Written discharge instructions were provided to the                            patient.                           - Resume previous diet.                           - Continue present medications.                           - Await pathology results.                           - Repeat colonoscopy is recommended. The                            colonoscopy date will be determined after pathology                            results from today's exam become available for                            review. Jerene Bears, MD 03/09/2017 1:58:34 PM This report has been signed electronically.

## 2017-03-12 ENCOUNTER — Telehealth: Payer: Self-pay | Admitting: *Deleted

## 2017-03-12 NOTE — Telephone Encounter (Signed)
  Follow up Call-  Call back number 03/09/2017  Post procedure Call Back phone  # [phone]  Permission to leave phone message Yes  Some recent data might be hidden     Patient questions:  Do you have a fever, pain , or abdominal swelling? No. Pain Score  0 *  Have you tolerated food without any problems? Yes.    Have you been able to return to your normal activities? Yes.    Do you have any questions about your discharge instructions: Diet   No. Medications  No. Follow up visit  No.  Do you have questions or concerns about your Care? No.  Actions: * If pain score is 4 or above: No action needed, pain <4.

## 2017-03-19 ENCOUNTER — Ambulatory Visit (INDEPENDENT_AMBULATORY_CARE_PROVIDER_SITE_OTHER): Payer: BLUE CROSS/BLUE SHIELD | Admitting: Family Medicine

## 2017-03-19 ENCOUNTER — Encounter: Payer: Self-pay | Admitting: Family Medicine

## 2017-03-19 DIAGNOSIS — R51 Headache: Secondary | ICD-10-CM | POA: Diagnosis not present

## 2017-03-19 DIAGNOSIS — R519 Headache, unspecified: Secondary | ICD-10-CM

## 2017-03-19 NOTE — Progress Notes (Signed)
Pre visit review using our clinic review tool, if applicable. No additional management support is needed unless otherwise documented below in the visit note. 

## 2017-03-19 NOTE — Patient Instructions (Signed)
Great to see you.  Try bendadryl 25 mg nightly for the next few nights.  Call your eye doctor.

## 2017-03-19 NOTE — Assessment & Plan Note (Signed)
History and exam reassuring- ? Allergic rhinitis.  Discussed flonase, oral antihistamines. Also she will call to make an eye appointment for her yearly dilated exam. Call or return to clinic prn if these symptoms worsen or fail to improve as anticipated. The patient indicates understanding of these issues and agrees with the plan.

## 2017-03-19 NOTE — Progress Notes (Signed)
Subjective:   Patient ID: April Cooper, female    DOB: August 26, 1966, 51 y.o.   MRN: [id number]  April Cooper is a pleasant 51 y.o. year old female who presents to clinic today with Headache ("Waking uo with headaches", going on fir a week now. )  on 03/19/2017  HPI:  Headaches- past week waking up with daily headaches- often bilateral frontal or behind her eyes.  Did have recent URI with some congestion.  No sinus pressure.  No nausea, vomiting or photophobia.  Has noticed visual changes for "awhile."   Due for eye exam.  Tylenol does help with headaches.  Current Outpatient Prescriptions on File Prior to Visit  Medication Sig Dispense Refill  . cyclobenzaprine (FLEXERIL) 5 MG tablet Take 1 tablet (5 mg total) by mouth 3 (three) times daily as needed for muscle spasms. 30 tablet 1  . ibuprofen (ADVIL,MOTRIN) 600 MG tablet Take 1 tablet (600 mg total) by mouth every 8 (eight) hours as needed for moderate pain. 90 tablet 1  . Multiple Vitamin (MULTIVITAMIN WITH MINERALS) TABS Take 1 tablet by mouth daily.    . rosuvastatin (CRESTOR) 5 MG tablet Take 1 tablet (5 mg total) by mouth daily. 90 tablet 3   Current Facility-Administered Medications on File Prior to Visit  Medication Dose Route Frequency Provider Last Rate Last Dose  . 0.9 %  sodium chloride infusion  500 mL Intravenous Continuous Pyrtle, Lajuan Lines, MD        Allergies  Allergen Reactions  . Penicillins Rash and Hives    Past Medical History:  Diagnosis Date  . AMA (advanced maternal age) multigravida 66+   . Ectopic pregnancy   . Hyperlipidemia   . Menometrorrhagia 01/2004, 12/2011  . Nephrolithiasis   . OBESITY   . Penicillin allergy 11/04/2002  . Vitamin D deficiency   . Vulvovaginal candidiasis 07/2006    Past Surgical History:  Procedure Laterality Date  . ABLATION  2013  . CYSTOSCOPY WITH RETROGRADE PYELOGRAM, URETEROSCOPY AND STENT PLACEMENT Right 01/16/2013   Procedure: CYSTOSCOPY WITH  RETROGRADE PYELOGRAM, URETEROSCOPY AND STENT PLACEMENT;  Surgeon: Dutch Gray, MD;  Location: [facility];  Service: Urology;  Laterality: Right;  . HOLMIUM LASER APPLICATION Right 5/85/2778   Procedure: HOLMIUM LASER APPLICATION;  Surgeon: Dutch Gray, MD;  Location: [facility];  Service: Urology;  Laterality: Right;  . TUBAL LIGATION  2004  . TUBAL LIGATION  2004    Family History  Problem Relation Age of Onset  . Hypertension Mother   . Hypertension Father   . Diabetes Sister   . Diabetes Maternal Aunt   . Hypertension Maternal Grandmother   . Colon cancer Neg Hx     Social History   Social History  . Marital status: Married    Spouse name: N/A  . Number of children: N/A  . Years of education: N/A   Occupational History  . Not on file.   Social History Main Topics  . Smoking status: Never Smoker  . Smokeless tobacco: Never Used  . Alcohol use Yes     Comment: twice monthly  . Drug use: No  . Sexual activity: Yes   Other Topics Concern  . Not on file   Social History Narrative  . No narrative on file   The PMH, PSH, Social History, Family History, Medications, and allergies have been reviewed in [facility], and have been updated if relevant.   Review of Systems  Constitutional: Negative.   HENT: Positive for congestion.  Negative for facial swelling, hearing loss, rhinorrhea, sinus pain and sinus pressure.   Eyes: Positive for visual disturbance. Negative for photophobia.  Musculoskeletal: Negative.   Neurological: Positive for headaches. Negative for dizziness, tremors, seizures, syncope, facial asymmetry, speech difficulty, weakness, light-headedness and numbness.  Hematological: Negative.   Psychiatric/Behavioral: Negative.        Objective:    BP 130/82   Pulse (!) 59   Temp 98.1 F (36.7 C)   Ht 5' 3.5" (1.613 m)   Wt 246 lb (111.6 kg)   SpO2 99%   BMI 42.89 kg/m    Physical Exam  Constitutional: She is oriented to person, place, and time. She appears  well-developed and well-nourished. No distress.  HENT:  Head: Normocephalic and atraumatic.  Right Ear: Hearing and tympanic membrane normal.  Left Ear: Hearing and tympanic membrane normal.  Nose: Mucosal edema present. No sinus tenderness. Right sinus exhibits no maxillary sinus tenderness and no frontal sinus tenderness. Left sinus exhibits no maxillary sinus tenderness and no frontal sinus tenderness.  Eyes: Conjunctivae are normal.  Cardiovascular: Normal rate.   Pulmonary/Chest: Effort normal.  Musculoskeletal: Normal range of motion.  Neurological: She is alert and oriented to person, place, and time. No cranial nerve deficit.  Skin: Skin is warm and dry. She is not diaphoretic.  Psychiatric: She has a normal mood and affect. Her behavior is normal. Judgment and thought content normal.  Nursing note and vitals reviewed.         Assessment & Plan:   Frequent headaches No Follow-up on file.

## 2017-03-20 ENCOUNTER — Encounter: Payer: Self-pay | Admitting: Internal Medicine

## 2017-04-06 ENCOUNTER — Other Ambulatory Visit: Payer: BLUE CROSS/BLUE SHIELD

## 2017-07-25 DIAGNOSIS — Z23 Encounter for immunization: Secondary | ICD-10-CM | POA: Diagnosis not present

## 2017-08-16 ENCOUNTER — Ambulatory Visit (INDEPENDENT_AMBULATORY_CARE_PROVIDER_SITE_OTHER): Payer: BLUE CROSS/BLUE SHIELD | Admitting: Family Medicine

## 2017-08-16 ENCOUNTER — Encounter: Payer: Self-pay | Admitting: Family Medicine

## 2017-08-16 VITALS — BP 136/84 | HR 70 | Temp 98.0°F | Ht 63.5 in | Wt 259.4 lb

## 2017-08-16 DIAGNOSIS — R682 Dry mouth, unspecified: Secondary | ICD-10-CM | POA: Diagnosis not present

## 2017-08-16 DIAGNOSIS — E785 Hyperlipidemia, unspecified: Secondary | ICD-10-CM

## 2017-08-16 DIAGNOSIS — R202 Paresthesia of skin: Secondary | ICD-10-CM | POA: Diagnosis not present

## 2017-08-16 LAB — LIPID PANEL
Cholesterol: 157 mg/dL (ref 0–200)
HDL: 59.3 mg/dL (ref >39.00)
LDL Cholesterol: 79 mg/dL (ref 0–99)
NonHDL: 97.22
Total CHOL/HDL Ratio: 3
Triglycerides: 89 mg/dL (ref 0.0–149.0)
VLDL: 17.8 mg/dL (ref 0.0–40.0)

## 2017-08-16 LAB — VITAMIN B12: Vitamin B-12: 491 pg/mL (ref 211–911)

## 2017-08-16 LAB — TSH: TSH: 3.35 u[IU]/mL (ref 0.35–4.50)

## 2017-08-16 LAB — HEMOGLOBIN A1C: Hgb A1c MFr Bld: 7.2 % — ABNORMAL HIGH (ref 4.6–6.5)

## 2017-08-16 NOTE — Assessment & Plan Note (Signed)
Most consistent some cervical nerve impingement and or mild carpal tunnel. Supportive/conservative therapy advised. Will check labs as well given dry mouth. The patient indicates understanding of these issues and agrees with the plan. Orders Placed This Encounter  Procedures  . TSH  . Vitamin B12  . Hemoglobin A1c  . Lipid panel

## 2017-08-16 NOTE — Progress Notes (Signed)
Subjective:   Patient ID: April Cooper, female    DOB: [birth year], 51 y.o.   MRN: [id number]  April Cooper is a pleasant 51 y.o. year old female who presents to clinic today with Tingling (Patient is here today C/O tingling in right hand 3rd and 4th digits)  on 08/16/2017  HPI:  Tingling of 3rd and 4th digits of right hand. Feels like her carpal tunnel but she wanted to be sure. She can usually "shake it out." Does sometimes wake up with it.  Does not wake her up from sleep.  No decreased grip strength or UE weakness.  Also has noticed some dry mouth for the past week. No increased thirst or urination.  Current Outpatient Medications on File Prior to Visit  Medication Sig Dispense Refill  . cyclobenzaprine (FLEXERIL) 5 MG tablet Take 1 tablet (5 mg total) by mouth 3 (three) times daily as needed for muscle spasms. 30 tablet 1  . ibuprofen (ADVIL,MOTRIN) 600 MG tablet Take 1 tablet (600 mg total) by mouth every 8 (eight) hours as needed for moderate pain. 90 tablet 1  . Multiple Vitamin (MULTIVITAMIN WITH MINERALS) TABS Take 1 tablet by mouth daily.    . rosuvastatin (CRESTOR) 5 MG tablet Take 1 tablet (5 mg total) by mouth daily. 90 tablet 3   No current facility-administered medications on file prior to visit.     Allergies  Allergen Reactions  . Penicillins Rash and Hives    Past Medical History:  Diagnosis Date  . AMA (advanced maternal age) multigravida 61+   . Ectopic pregnancy   . Hyperlipidemia   . Menometrorrhagia 01/2004, 12/2011  . Nephrolithiasis   . OBESITY   . Penicillin allergy 11/04/2002  . Vitamin D deficiency   . Vulvovaginal candidiasis 07/2006    Past Surgical History:  Procedure Laterality Date  . ABLATION  2013  . TUBAL LIGATION  2004  . TUBAL LIGATION  2004    Family History  Problem Relation Age of Onset  . Hypertension Mother   . Hypertension Father   . Diabetes Sister   . Diabetes Maternal Aunt   . Hypertension Maternal  Grandmother   . Colon cancer Neg Hx     Social History   Socioeconomic History  . Marital status: Married    Spouse name: Not on file  . Number of children: Not on file  . Years of education: Not on file  . Highest education level: Not on file  Social Needs  . Financial resource strain: Not on file  . Food insecurity - worry: Not on file  . Food insecurity - inability: Not on file  . Transportation needs - medical: Not on file  . Transportation needs - non-medical: Not on file  Occupational History  . Not on file  Tobacco Use  . Smoking status: Never Smoker  . Smokeless tobacco: Never Used  Substance and Sexual Activity  . Alcohol use: Yes    Comment: twice monthly  . Drug use: No  . Sexual activity: Yes  Other Topics Concern  . Not on file  Social History Narrative  . Not on file   The PMH, PSH, Social History, Family History, Medications, and allergies have been reviewed in [facility], and have been updated if relevant.   Review of Systems  Musculoskeletal: Negative.   Neurological: Positive for numbness. Negative for dizziness, tremors, seizures, syncope, facial asymmetry, speech difficulty, weakness, light-headedness and headaches.  All other systems reviewed and are negative.  Objective:    BP 136/84 (BP Location: Left Arm, Patient Position: Sitting, Cuff Size: Large)   Pulse 70   Temp 98 F (36.7 C) (Oral)   Ht 5' 3.5" (1.613 m)   Wt 259 lb 6.4 oz (117.7 kg)   SpO2 96%   BMI 45.23 kg/m    Physical Exam  Constitutional: She is oriented to person, place, and time. She appears well-developed and well-nourished. No distress.  HENT:  Head: Normocephalic and atraumatic.  Eyes: Conjunctivae are normal.  Cardiovascular: Normal rate.  Pulmonary/Chest: Effort normal.  Abdominal: Soft.  Musculoskeletal:  + tinnel right Normal grip strength bilaterally Some tightness of cervical paraspinous and trapezius muscles, FROM of neck  Neurological: She is alert and  oriented to person, place, and time.  Skin: Skin is warm and dry. She is not diaphoretic.  Psychiatric: She has a normal mood and affect. Her behavior is normal. Judgment and thought content normal.  Nursing note and vitals reviewed.         Assessment & Plan:   Hyperlipidemia, unspecified hyperlipidemia type - Plan: Lipid panel  Dry mouth - Plan: TSH, Vitamin B12, Hemoglobin A1c  Paresthesias - Plan: TSH, Vitamin B12 No Follow-up on file.

## 2017-08-27 ENCOUNTER — Encounter: Payer: Self-pay | Admitting: Family Medicine

## 2017-08-27 ENCOUNTER — Ambulatory Visit (INDEPENDENT_AMBULATORY_CARE_PROVIDER_SITE_OTHER): Payer: BLUE CROSS/BLUE SHIELD | Admitting: Family Medicine

## 2017-08-27 DIAGNOSIS — E1165 Type 2 diabetes mellitus with hyperglycemia: Secondary | ICD-10-CM | POA: Insufficient documentation

## 2017-08-27 DIAGNOSIS — E119 Type 2 diabetes mellitus without complications: Secondary | ICD-10-CM | POA: Diagnosis not present

## 2017-08-27 MED ORDER — METFORMIN HCL 500 MG PO TABS: 500.0000 mg | ORAL_TABLET | Freq: Every day | ORAL | 3 refills | Status: DC

## 2017-08-27 NOTE — Progress Notes (Signed)
Subjective:   Patient ID: April Cooper, female    DOB: April 29, 1966, 51 y.o.   MRN: [id number]  April Cooper is a pleasant 51 y.o. year old female who presents to clinic today with Diabetes (Feeling well.)  on 08/27/2017  HPI:  New onset diabetes- We checked an a1c at her last OV as she was complaining of fatigue, dry mouth and paresthesias. She does have a personal history of gestational diabetes and a family history of DM.  She has been cutting back on carbohydrates since I last saw her. Motivated to reverse this new diagnosis. Lab Results  Component Value Date   HGBA1C 7.2 (H) 08/16/2017     Current Outpatient Medications on File Prior to Visit  Medication Sig Dispense Refill  . cyclobenzaprine (FLEXERIL) 5 MG tablet Take 1 tablet (5 mg total) by mouth 3 (three) times daily as needed for muscle spasms. 30 tablet 1  . ibuprofen (ADVIL,MOTRIN) 600 MG tablet Take 1 tablet (600 mg total) by mouth every 8 (eight) hours as needed for moderate pain. 90 tablet 1  . Multiple Vitamin (MULTIVITAMIN WITH MINERALS) TABS Take 1 tablet by mouth daily.    . rosuvastatin (CRESTOR) 5 MG tablet Take 1 tablet (5 mg total) by mouth daily. 90 tablet 3   No current facility-administered medications on file prior to visit.     Allergies  Allergen Reactions  . Penicillins Rash and Hives    Past Medical History:  Diagnosis Date  . AMA (advanced maternal age) multigravida 41+   . Ectopic pregnancy   . Hyperlipidemia   . Menometrorrhagia 01/2004, 12/2011  . Nephrolithiasis   . OBESITY   . Penicillin allergy 11/04/2002  . Vitamin D deficiency   . Vulvovaginal candidiasis 07/2006    Past Surgical History:  Procedure Laterality Date  . ABLATION  2013  . CYSTOSCOPY WITH RETROGRADE PYELOGRAM, URETEROSCOPY AND STENT PLACEMENT Right 01/16/2013   Performed by Dutch Gray, MD at [facility]  . HOLMIUM LASER APPLICATION Right 3/55/9741   Performed by Dutch Gray, MD at [facility]  . TUBAL LIGATION   2004  . TUBAL LIGATION  2004    Family History  Problem Relation Age of Onset  . Hypertension Mother   . Hypertension Father   . Diabetes Sister   . Diabetes Maternal Aunt   . Hypertension Maternal Grandmother   . Colon cancer Neg Hx     Social History   Socioeconomic History  . Marital status: Married    Spouse name: Not on file  . Number of children: Not on file  . Years of education: Not on file  . Highest education level: Not on file  Social Needs  . Financial resource strain: Not on file  . Food insecurity - worry: Not on file  . Food insecurity - inability: Not on file  . Transportation needs - medical: Not on file  . Transportation needs - non-medical: Not on file  Occupational History  . Not on file  Tobacco Use  . Smoking status: Never Smoker  . Smokeless tobacco: Never Used  Substance and Sexual Activity  . Alcohol use: Yes    Comment: twice monthly  . Drug use: No  . Sexual activity: Yes  Other Topics Concern  . Not on file  Social History Narrative  . Not on file   The PMH, PSH, Social History, Family History, Medications, and allergies have been reviewed in [facility], and have been updated if relevant.   Review  of Systems  Constitutional: Positive for fatigue.  Endocrine: Negative.   Neurological: Negative.   Hematological: Negative.   Psychiatric/Behavioral: Negative.   All other systems reviewed and are negative.      Objective:    BP 128/72 (BP Location: Left Arm, Patient Position: Sitting, Cuff Size: Normal)   Pulse 65   Temp 98 F (36.7 C) (Oral)   Ht 5' 3.5" (1.613 m)   Wt 252 lb (114.3 kg)   SpO2 100%   BMI 43.94 kg/m    Physical Exam  Constitutional: She is oriented to person, place, and time. She appears well-developed and well-nourished. No distress.  HENT:  Head: Normocephalic and atraumatic.  Cardiovascular: Normal rate.  Pulmonary/Chest: Effort normal.  Musculoskeletal: Normal range of motion.  Neurological: She is alert  and oriented to person, place, and time.  Skin: Skin is warm and dry. She is not diaphoretic.  Psychiatric: She has a normal mood and affect. Her behavior is normal. Judgment and thought content normal.  Nursing note and vitals reviewed.         Assessment & Plan:   Diabetes mellitus, new onset ([place]) - Plan: Ambulatory referral to diabetic education No Follow-up on file.

## 2017-08-27 NOTE — Patient Instructions (Signed)
Great to see you.  We are starting Metformin 500 mg daily with breakfast.  Please stop by to see Vaughan Basta on your way out.  Please come see me in 3 months.

## 2017-08-27 NOTE — Assessment & Plan Note (Signed)
>  25 minutes spent in face to face time with patient, >50% spent in counselling or coordination of care. Start Metformin 500 mg daily with breakfast, refer for diabetic teaching. Follow up in 3 months for a1c and OV. The patient indicates understanding of these issues and agrees with the plan.

## 2017-09-18 ENCOUNTER — Ambulatory Visit: Payer: BLUE CROSS/BLUE SHIELD | Admitting: Registered"

## 2017-11-27 ENCOUNTER — Encounter: Payer: Self-pay | Admitting: Family Medicine

## 2017-11-27 ENCOUNTER — Ambulatory Visit (INDEPENDENT_AMBULATORY_CARE_PROVIDER_SITE_OTHER): Payer: BLUE CROSS/BLUE SHIELD | Admitting: Family Medicine

## 2017-11-27 VITALS — BP 142/86 | HR 66 | Temp 98.6°F | Ht 63.5 in | Wt 255.4 lb

## 2017-11-27 DIAGNOSIS — E119 Type 2 diabetes mellitus without complications: Secondary | ICD-10-CM

## 2017-11-27 DIAGNOSIS — Z6841 Body Mass Index (BMI) 40.0 and over, adult: Secondary | ICD-10-CM | POA: Diagnosis not present

## 2017-11-27 DIAGNOSIS — E1169 Type 2 diabetes mellitus with other specified complication: Secondary | ICD-10-CM | POA: Insufficient documentation

## 2017-11-27 DIAGNOSIS — E785 Hyperlipidemia, unspecified: Secondary | ICD-10-CM

## 2017-11-27 DIAGNOSIS — Z23 Encounter for immunization: Secondary | ICD-10-CM

## 2017-11-27 LAB — COMPREHENSIVE METABOLIC PANEL
ALT: 29 U/L (ref 0–35)
AST: 26 U/L (ref 0–37)
Albumin: 3.8 g/dL (ref 3.5–5.2)
Alkaline Phosphatase: 56 U/L (ref 39–117)
BUN: 11 mg/dL (ref 6–23)
CO2: 28 mEq/L (ref 19–32)
Calcium: 9.5 mg/dL (ref 8.4–10.5)
Chloride: 103 mEq/L (ref 96–112)
Creatinine, Ser: 0.82 mg/dL (ref 0.40–1.20)
GFR: 93.67 mL/min (ref >60.00)
Glucose, Bld: 153 mg/dL — ABNORMAL HIGH (ref 70–99)
Potassium: 4.1 mEq/L (ref 3.5–5.1)
Sodium: 137 mEq/L (ref 135–145)
Total Bilirubin: 0.3 mg/dL (ref 0.2–1.2)
Total Protein: 7 g/dL (ref 6.0–8.3)

## 2017-11-27 LAB — POCT UA - MICROALBUMIN
Albumin/Creatinine Ratio, Urine, POC: 30
Creatinine, POC: 100 mg/dL

## 2017-11-27 LAB — LIPID PANEL
Cholesterol: 180 mg/dL (ref 0–200)
HDL: 55.2 mg/dL (ref >39.00)
LDL Cholesterol: 100 mg/dL — ABNORMAL HIGH (ref 0–99)
NonHDL: 124.36
Total CHOL/HDL Ratio: 3
Triglycerides: 124 mg/dL (ref 0.0–149.0)
VLDL: 24.8 mg/dL (ref 0.0–40.0)

## 2017-11-27 LAB — HEMOGLOBIN A1C: Hgb A1c MFr Bld: 7.2 % — ABNORMAL HIGH (ref 4.6–6.5)

## 2017-11-27 MED ORDER — METFORMIN HCL 500 MG PO TABS: 500.0000 mg | ORAL_TABLET | Freq: Every day | ORAL | 1 refills | Status: DC

## 2017-11-27 NOTE — Assessment & Plan Note (Signed)
>  25 minutes spent in face to face time with patient, >50% spent in counselling or coordination of care discussing diabetes and obesity.

## 2017-11-27 NOTE — Patient Instructions (Signed)
Great to see you. I will call you with your lab results from today and you can view them online.   

## 2017-11-27 NOTE — Assessment & Plan Note (Signed)
Continue current rx. Orders Placed This Encounter  Procedures  . Pneumococcal polysaccharide vaccine 23-valent greater than or equal to 54yo subcutaneous/IM  . Hemoglobin A1c  . Comprehensive metabolic panel  . Lipid panel  . POCT UA - Microalbumin

## 2017-11-27 NOTE — Progress Notes (Signed)
Subjective:   Patient ID: April Cooper, female    DOB: [birth year], 52 y.o.   MRN: [id number]  April Cooper is a pleasant 52 y.o. year old female who presents to clinic today with Diabetes (Patient is here today for a 18-month-F/U with DM.  She agrees to PNV-23 Vaccine.  She is not currently fasting.  She went to [facility] for an eye exam 6.1.18. She has lost 7 lbs since last visit.)  on 11/27/2017  HPI:  New onset diabetes- We checked an a1c in 08/2017 because she was complaining of fatigue, dry mouth and paresthesias. She does have a personal history of gestational diabetes and a family history of DM.  She has been cutting back on carbohydrates since I last saw her. Motivated to reverse this new diagnosis.  Has been compliant with Metformin 500 mg daily with breakfast. Lab Results  Component Value Date   HGBA1C 7.2 (H) 08/16/2017   HLD- currently taking Crestor 5 mg daily.  Lab Results  Component Value Date   CHOL 157 08/16/2017   HDL 59.30 08/16/2017   LDLCALC 79 08/16/2017   LDLDIRECT 163.0 01/11/2017   TRIG 89.0 08/16/2017   CHOLHDL 3 08/16/2017    Obesity- She has been eating a more diabetic friendly diet but admits to being frustrated by some of her food choices and lack of more weight loss. I referred to a diabetic nutritionist in 08/2017 at the time of diagnosis but she unfortunately canceled the referral. She does want help with weight loss.  She is willing to commit to a weight loss plan.  Wt Readings from Last 3 Encounters:  11/27/17 255 lb 6.4 oz (115.8 kg)  08/27/17 252 lb (114.3 kg)  08/16/17 259 lb 6.4 oz (117.7 kg)    Current Outpatient Medications on File Prior to Visit  Medication Sig Dispense Refill  . cyclobenzaprine (FLEXERIL) 5 MG tablet Take 1 tablet (5 mg total) by mouth 3 (three) times daily as needed for muscle spasms. 30 tablet 1  . ibuprofen (ADVIL,MOTRIN) 600 MG tablet Take 1 tablet (600 mg total) by mouth every 8 (eight) hours as  needed for moderate pain. 90 tablet 1  . Multiple Vitamin (MULTIVITAMIN WITH MINERALS) TABS Take 1 tablet by mouth daily.    . rosuvastatin (CRESTOR) 5 MG tablet Take 1 tablet (5 mg total) by mouth daily. 90 tablet 3   No current facility-administered medications on file prior to visit.     Allergies  Allergen Reactions  . Penicillins Rash and Hives    Past Medical History:  Diagnosis Date  . AMA (advanced maternal age) multigravida 58+   . Ectopic pregnancy   . Hyperlipidemia   . Menometrorrhagia 01/2004, 12/2011  . Nephrolithiasis   . OBESITY   . Penicillin allergy 11/04/2002  . Vitamin D deficiency   . Vulvovaginal candidiasis 07/2006    Past Surgical History:  Procedure Laterality Date  . ABLATION  2013  . CYSTOSCOPY WITH RETROGRADE PYELOGRAM, URETEROSCOPY AND STENT PLACEMENT Right 01/16/2013   Procedure: CYSTOSCOPY WITH RETROGRADE PYELOGRAM, URETEROSCOPY AND STENT PLACEMENT;  Surgeon: Dutch Gray, MD;  Location: [facility];  Service: Urology;  Laterality: Right;  . HOLMIUM LASER APPLICATION Right 6/65/9935   Procedure: HOLMIUM LASER APPLICATION;  Surgeon: Dutch Gray, MD;  Location: [facility];  Service: Urology;  Laterality: Right;  . TUBAL LIGATION  2004  . TUBAL LIGATION  2004    Family History  Problem Relation Age of Onset  . Hypertension Mother   .  Hypertension Father   . Diabetes Sister   . Diabetes Maternal Aunt   . Hypertension Maternal Grandmother   . Colon cancer Neg Hx     Social History   Socioeconomic History  . Marital status: Married    Spouse name: Not on file  . Number of children: Not on file  . Years of education: Not on file  . Highest education level: Not on file  Social Needs  . Financial resource strain: Not on file  . Food insecurity - worry: Not on file  . Food insecurity - inability: Not on file  . Transportation needs - medical: Not on file  . Transportation needs - non-medical: Not on file  Occupational History  . Not on file  Tobacco  Use  . Smoking status: Never Smoker  . Smokeless tobacco: Never Used  Substance and Sexual Activity  . Alcohol use: Yes    Comment: twice monthly  . Drug use: No  . Sexual activity: Yes  Other Topics Concern  . Not on file  Social History Narrative  . Not on file   The PMH, PSH, Social History, Family History, Medications, and allergies have been reviewed in [facility], and have been updated if relevant.   Review of Systems  Constitutional: Positive for fatigue.  Endocrine: Negative.   Neurological: Negative.   Hematological: Negative.   Psychiatric/Behavioral: Negative.   All other systems reviewed and are negative.      Objective:    BP (!) 142/86 (BP Location: Left Arm, Patient Position: Sitting, Cuff Size: Normal)   Pulse 66   Temp 98.6 F (37 C) (Oral)   Ht 5' 3.5" (1.613 m)   Wt 255 lb 6.4 oz (115.8 kg)   SpO2 96%   BMI 44.53 kg/m    Physical Exam  Constitutional: She is oriented to person, place, and time. She appears well-developed and well-nourished. No distress.  HENT:  Head: Normocephalic and atraumatic.  Eyes: Conjunctivae and EOM are normal.  Neck: Normal range of motion.  Cardiovascular: Normal rate.  Pulmonary/Chest: Effort normal.  Musculoskeletal: Normal range of motion.  Neurological: She is alert and oriented to person, place, and time.  Skin: Skin is warm and dry. She is not diaphoretic.  Psychiatric: She has a normal mood and affect. Her behavior is normal. Judgment and thought content normal.  Nursing note and vitals reviewed.         Assessment & Plan:   Diabetes mellitus, new onset ([place]) - Plan: Hemoglobin A1c, Comprehensive metabolic panel, Lipid panel, POCT UA - Microalbumin  Class 3 severe obesity due to excess calories with serious comorbidity and body mass index (BMI) of 40.0 to 44.9 in adult [facility])  Hyperlipidemia, unspecified hyperlipidemia type  Need for 23-polyvalent pneumococcal polysaccharide vaccine - Plan: Pneumococcal  polysaccharide vaccine 23-valent greater than or equal to 2yo subcutaneous/IM No Follow-up on file.

## 2018-02-06 ENCOUNTER — Encounter: Payer: Self-pay | Admitting: Family Medicine

## 2018-02-06 ENCOUNTER — Ambulatory Visit (INDEPENDENT_AMBULATORY_CARE_PROVIDER_SITE_OTHER): Payer: BLUE CROSS/BLUE SHIELD

## 2018-02-06 ENCOUNTER — Ambulatory Visit (INDEPENDENT_AMBULATORY_CARE_PROVIDER_SITE_OTHER): Payer: BLUE CROSS/BLUE SHIELD | Admitting: Family Medicine

## 2018-02-06 VITALS — BP 126/86 | HR 76 | Temp 97.6°F | Ht 63.2 in | Wt 254.2 lb

## 2018-02-06 DIAGNOSIS — R070 Pain in throat: Secondary | ICD-10-CM

## 2018-02-06 DIAGNOSIS — M545 Low back pain, unspecified: Secondary | ICD-10-CM

## 2018-02-06 DIAGNOSIS — J029 Acute pharyngitis, unspecified: Secondary | ICD-10-CM

## 2018-02-06 DIAGNOSIS — M5416 Radiculopathy, lumbar region: Secondary | ICD-10-CM | POA: Diagnosis not present

## 2018-02-06 LAB — POCT RAPID STREP A (OFFICE): Rapid Strep A Screen: NEGATIVE

## 2018-02-06 MED ORDER — AZITHROMYCIN 250 MG PO TABS: ORAL_TABLET | ORAL | 0 refills | Status: DC

## 2018-02-06 MED ORDER — GABAPENTIN 100 MG PO CAPS
ORAL_CAPSULE | ORAL | 3 refills | Status: DC
Start: 2018-02-06 — End: 2018-07-07

## 2018-02-06 NOTE — Assessment & Plan Note (Signed)
New- Rapid strep is neg but she does have all the cardinal symptoms and findings on exam. Will treat for strep throat and assume false negative RST. Allergic to PCN- eRx sent for zpack.  Per orders. Gargle, use acetaminophen or other OTC analgesic, and take Rx fully as prescribed. Call if other family members develop similar symptoms. See prn.

## 2018-02-06 NOTE — Patient Instructions (Signed)
Take zpack as directed. Start gabapentin 100- 300 mg nightly.  We will call you with your xray results.

## 2018-02-06 NOTE — Progress Notes (Signed)
Subjective:   Patient ID: April Cooper, female    DOB: 12-04-1965, 52 y.o.   MRN: [id number]  April Cooper is a pleasant 52 y.o. year old female who presents to clinic today with Sore Throat (Patient is here today C/O throat pain.  Started with throat pain on Saturday at the [place].  Sunday when she got home she laid around all day.  Monday she started with chills.  Throat soreness is not as bad but now has body aches.  States that her neck is sore.)  on 02/06/2018  HPI:  Sore throat- started 3 days ago.  Remains very sore but maybe a little better.  Now has chills and new body aches.  Not sure if she has had a fever. No runny nose, cough, congestion.  Back pain with left sided radiculopathy- ongoing for months, getting progressively worse.  Now to the point that she can't sleep laying down at night.  Cannot get comfortable- often sleeps in a recliner. No urinary symptoms.  No LE weakness.  Flexeril helps for only short periods of time.  Current Outpatient Medications on File Prior to Visit  Medication Sig Dispense Refill  . cyclobenzaprine (FLEXERIL) 5 MG tablet Take 1 tablet (5 mg total) by mouth 3 (three) times daily as needed for muscle spasms. 30 tablet 1  . ibuprofen (ADVIL,MOTRIN) 600 MG tablet Take 1 tablet (600 mg total) by mouth every 8 (eight) hours as needed for moderate pain. 90 tablet 1  . metFORMIN (GLUCOPHAGE) 500 MG tablet Take 1 tablet (500 mg total) by mouth daily with breakfast. 90 tablet 1  . Multiple Vitamin (MULTIVITAMIN WITH MINERALS) TABS Take 1 tablet by mouth daily.    . rosuvastatin (CRESTOR) 5 MG tablet Take 1 tablet (5 mg total) by mouth daily. 90 tablet 3   No current facility-administered medications on file prior to visit.     Allergies  Allergen Reactions  . Penicillins Rash and Hives    Past Medical History:  Diagnosis Date  . AMA (advanced maternal age) multigravida 35+   . Ectopic pregnancy   . Hyperlipidemia   . Menometrorrhagia  01/2004, 12/2011  . Nephrolithiasis   . OBESITY   . Penicillin allergy 11/04/2002  . Vitamin D deficiency   . Vulvovaginal candidiasis 07/2006    Past Surgical History:  Procedure Laterality Date  . ABLATION  2013  . CYSTOSCOPY WITH RETROGRADE PYELOGRAM, URETEROSCOPY AND STENT PLACEMENT Right 01/16/2013   Procedure: CYSTOSCOPY WITH RETROGRADE PYELOGRAM, URETEROSCOPY AND STENT PLACEMENT;  Surgeon: Dutch Gray, MD;  Location: [facility];  Service: Urology;  Laterality: Right;  . HOLMIUM LASER APPLICATION Right 11/08/8655   Procedure: HOLMIUM LASER APPLICATION;  Surgeon: Dutch Gray, MD;  Location: [facility];  Service: Urology;  Laterality: Right;  . TUBAL LIGATION  2004  . TUBAL LIGATION  2004    Family History  Problem Relation Age of Onset  . Hypertension Mother   . Hypertension Father   . Diabetes Sister   . Diabetes Maternal Aunt   . Hypertension Maternal Grandmother   . Colon cancer Neg Hx     Social History   Socioeconomic History  . Marital status: Married    Spouse name: Not on file  . Number of children: Not on file  . Years of education: Not on file  . Highest education level: Not on file  Occupational History  . Not on file  Social Needs  . Financial resource strain: Not on file  . Food  insecurity:    Worry: Not on file    Inability: Not on file  . Transportation needs:    Medical: Not on file    Non-medical: Not on file  Tobacco Use  . Smoking status: Never Smoker  . Smokeless tobacco: Never Used  Substance and Sexual Activity  . Alcohol use: Yes    Comment: twice monthly  . Drug use: No  . Sexual activity: Yes  Lifestyle  . Physical activity:    Days per week: Not on file    Minutes per session: Not on file  . Stress: Not on file  Relationships  . Social connections:    Talks on phone: Not on file    Gets together: Not on file    Attends religious service: Not on file    Active member of club or organization: Not on file    Attends meetings of clubs or  organizations: Not on file    Relationship status: Not on file  . Intimate partner violence:    Fear of current or ex partner: Not on file    Emotionally abused: Not on file    Physically abused: Not on file    Forced sexual activity: Not on file  Other Topics Concern  . Not on file  Social History Narrative  . Not on file   The PMH, PSH, Social History, Family History, Medications, and allergies have been reviewed in [facility], and have been updated if relevant.   Review of Systems  Constitutional: Positive for chills.  HENT: Positive for sore throat. Negative for congestion, dental problem, drooling, ear discharge, ear pain, facial swelling, hearing loss, mouth sores, nosebleeds, postnasal drip, rhinorrhea, sinus pressure, sinus pain, sneezing, tinnitus, trouble swallowing and voice change.   Genitourinary: Negative.   Musculoskeletal: Positive for back pain and myalgias.  Skin: Negative.   Neurological: Positive for numbness. Negative for weakness.  All other systems reviewed and are negative.      Objective:    BP 126/86 (BP Location: Left Arm, Patient Position: Sitting, Cuff Size: Normal)   Pulse 76   Temp 97.6 F (36.4 C) (Oral)   Ht 5' 3.2" (1.605 m)   Wt 254 lb 3.2 oz (115.3 kg)   SpO2 98%   BMI 44.74 kg/m    Physical Exam  Constitutional: She is oriented to person, place, and time. She appears well-developed and well-nourished.  Non-toxic appearance. She does not appear ill.  HENT:  Head: Normocephalic and atraumatic.  Right Ear: Hearing and tympanic membrane normal.  Left Ear: Hearing and tympanic membrane normal.  Mouth/Throat: Mucous membranes are normal. No oral lesions. No uvula swelling. Oropharyngeal exudate and posterior oropharyngeal erythema present. No tonsillar abscesses. Tonsillar exudate.  Eyes: Pupils are equal, round, and reactive to light.  Neck: Normal range of motion.  Cardiovascular: Normal rate.  Pulmonary/Chest: Effort normal.    Musculoskeletal:       Lumbar back: She exhibits decreased range of motion and tenderness. She exhibits no bony tenderness, no swelling, no edema, no deformity, no laceration, no pain, no spasm and normal pulse.  SLR pos right  Neurological: She is alert and oriented to person, place, and time.  Skin: Skin is warm and dry.  Psychiatric: She has a normal mood and affect. Her behavior is normal.  Nursing note and vitals reviewed.         Assessment & Plan:   Throat pain in adult - Plan: POCT rapid strep A  Sore throat  Back pain  at L4-L5 level - Plan: DG Lumbar Spine Complete, DG Lumbar Spine Complete No follow-ups on file.

## 2018-02-06 NOTE — Assessment & Plan Note (Signed)
Progressive- will get lumbar xray to rule out disc herniation. She does have radiculopathy- will start gabapentin- 100- 300 mg nightly as tolerated. May need further imaging and or PT if symptoms continue. The patient indicates understanding of these issues and agrees with the plan.

## 2018-03-27 DIAGNOSIS — Z6841 Body Mass Index (BMI) 40.0 and over, adult: Secondary | ICD-10-CM | POA: Diagnosis not present

## 2018-03-27 DIAGNOSIS — Z124 Encounter for screening for malignant neoplasm of cervix: Secondary | ICD-10-CM | POA: Diagnosis not present

## 2018-03-27 DIAGNOSIS — Z01419 Encounter for gynecological examination (general) (routine) without abnormal findings: Secondary | ICD-10-CM | POA: Diagnosis not present

## 2018-03-27 DIAGNOSIS — Z1231 Encounter for screening mammogram for malignant neoplasm of breast: Secondary | ICD-10-CM | POA: Diagnosis not present

## 2018-04-15 ENCOUNTER — Other Ambulatory Visit: Payer: Self-pay | Admitting: Family Medicine

## 2018-04-24 ENCOUNTER — Encounter: Payer: Self-pay | Admitting: Family Medicine

## 2018-05-31 ENCOUNTER — Other Ambulatory Visit: Payer: Self-pay | Admitting: Family Medicine

## 2018-07-04 DIAGNOSIS — Z23 Encounter for immunization: Secondary | ICD-10-CM | POA: Diagnosis not present

## 2018-07-07 ENCOUNTER — Ambulatory Visit (HOSPITAL_COMMUNITY)
Admission: EM | Admit: 2018-07-07 | Discharge: 2018-07-07 | Disposition: A | Discharge status: Home / Self Care | Payer: BLUE CROSS/BLUE SHIELD | Attending: Urgent Care | Admitting: Urgent Care

## 2018-07-07 ENCOUNTER — Encounter (HOSPITAL_COMMUNITY): Payer: Self-pay

## 2018-07-07 ENCOUNTER — Other Ambulatory Visit: Payer: Self-pay

## 2018-07-07 DIAGNOSIS — E119 Type 2 diabetes mellitus without complications: Secondary | ICD-10-CM

## 2018-07-07 DIAGNOSIS — Z87442 Personal history of urinary calculi: Secondary | ICD-10-CM

## 2018-07-07 DIAGNOSIS — R109 Unspecified abdominal pain: Secondary | ICD-10-CM

## 2018-07-07 DIAGNOSIS — R1013 Epigastric pain: Secondary | ICD-10-CM

## 2018-07-07 LAB — POCT URINALYSIS DIP (DEVICE)
Bilirubin Urine: NEGATIVE
Glucose, UA: NEGATIVE mg/dL
Ketones, ur: NEGATIVE mg/dL
Leukocytes, UA: NEGATIVE
Nitrite: NEGATIVE
Protein, ur: NEGATIVE mg/dL
Specific Gravity, Urine: 1.02 (ref 1.005–1.030)
Urobilinogen, UA: 0.2 mg/dL (ref 0.0–1.0)
pH: 7 (ref 5.0–8.0)

## 2018-07-07 MED ORDER — TRAMADOL HCL 50 MG PO TABS: 50.0000 mg | ORAL_TABLET | Freq: Three times a day (TID) | ORAL | 0 refills | Status: DC | PRN

## 2018-07-07 MED ORDER — TAMSULOSIN HCL 0.4 MG PO CAPS: 0.4000 mg | ORAL_CAPSULE | Freq: Every day | ORAL | 0 refills | Status: DC

## 2018-07-07 NOTE — ED Provider Notes (Addendum)
MRN: [id number] DOB: 03/15/1966  Subjective:   Nickolas Madrid Vullo is a 52 y.o. female presenting for acute onset of left sided abdominal/flank pain today. Has a history of renal stone, last episode was 2016, had the renal stone removed through procedure.  Denies fever, nausea, vomiting, diarrhea, constipation, bloody stools, dysuria, hematuria, urinary frequency.  Patient hydrates very well.  Denies history of diverticulitis, history of GERD.  Patient has a history of new onset diabetes supposed to be managed with metformin 500 mg once daily but patient cannot tolerate it so she is not currently taking.  Is managing her diabetes with diet alone.  reports that she has never smoked. She has never used smokeless tobacco. She reports that she drinks alcohol. She reports that she does not use drugs.   No current facility-administered medications for this encounter.   Current Outpatient Medications:  .  metFORMIN (GLUCOPHAGE) 500 MG tablet, TAKE 1 TABLET BY MOUTH EVERY DAY WITH BREAKFAST, Disp: 90 tablet, Rfl: 0 .  Multiple Vitamin (MULTIVITAMIN WITH MINERALS) TABS, Take 1 tablet by mouth daily., Disp: , Rfl:  .  rosuvastatin (CRESTOR) 5 MG tablet, TAKE 1 TABLET BY MOUTH EVERY DAY, Disp: 90 tablet, Rfl: 3   Allergies  Allergen Reactions  . Penicillins Rash and Hives    Past Medical History:  Diagnosis Date  . AMA (advanced maternal age) multigravida 88+   . Ectopic pregnancy   . Hyperlipidemia   . Menometrorrhagia 01/2004, 12/2011  . Nephrolithiasis   . OBESITY   . Penicillin allergy 11/04/2002  . Vitamin D deficiency   . Vulvovaginal candidiasis 07/2006     Past Surgical History:  Procedure Laterality Date  . ABLATION  2013  . CYSTOSCOPY WITH RETROGRADE PYELOGRAM, URETEROSCOPY AND STENT PLACEMENT Right 01/16/2013   Procedure: CYSTOSCOPY WITH RETROGRADE PYELOGRAM, URETEROSCOPY AND STENT PLACEMENT;  Surgeon: Dutch Gray, MD;  Location: [facility];  Service: Urology;  Laterality: Right;  . HOLMIUM  LASER APPLICATION Right 2/77/4128   Procedure: HOLMIUM LASER APPLICATION;  Surgeon: Dutch Gray, MD;  Location: [facility];  Service: Urology;  Laterality: Right;  . TUBAL LIGATION  2004  . TUBAL LIGATION  2004    Family History  Problem Relation Age of Onset  . Hypertension Mother   . Hypertension Father   . Diabetes Sister   . Diabetes Maternal Aunt   . Hypertension Maternal Grandmother   . Colon cancer Neg Hx       Objective:   Vitals: BP (!) 148/74 (BP Location: Right Arm)   Pulse 62   Temp 98.2 F (36.8 C) (Oral)   Resp 18   Wt 200 lb (90.7 kg)   SpO2 100%   BMI 35.20 kg/m   Physical Exam  Constitutional: She is oriented to person, place, and time. She appears well-developed and well-nourished. No distress.  HENT:  Mouth/Throat: Oropharynx is clear and moist.  Cardiovascular: Normal rate, regular rhythm, normal heart sounds and intact distal pulses. Exam reveals no gallop and no friction rub.  No murmur heard. Pulmonary/Chest: Effort normal and breath sounds normal. No stridor. No respiratory distress. She has no wheezes. She has no rales.  Abdominal: Soft. Bowel sounds are normal. She exhibits no distension and no mass. There is tenderness (epigastric, mild llq). There is no rebound and no guarding.  No CVA tenderness.  Musculoskeletal: She exhibits no edema.  Neurological: She is alert and oriented to person, place, and time.  Skin: Skin is warm and dry. No rash noted. She is not  diaphoretic. No erythema. No pallor.  Psychiatric: She has a normal mood and affect.   Results for orders placed or performed during the hospital encounter of 07/07/18 (from the past 24 hour(s))  POCT urinalysis dip (device)     Status: Abnormal   Collection Time: 07/07/18  2:04 PM  Result Value Ref Range   Glucose, UA NEGATIVE NEGATIVE mg/dL   Bilirubin Urine NEGATIVE NEGATIVE   Ketones, ur NEGATIVE NEGATIVE mg/dL   Specific Gravity, Urine 1.020 1.005 - 1.030   Hgb urine dipstick TRACE  (A) NEGATIVE   pH 7.0 5.0 - 8.0   Protein, ur NEGATIVE NEGATIVE mg/dL   Urobilinogen, UA 0.2 0.0 - 1.0 mg/dL   Nitrite NEGATIVE NEGATIVE   Leukocytes, UA NEGATIVE NEGATIVE   Assessment and Plan :   Abdominal pain, epigastric  Flank pain  Left sided abdominal pain  History of renal stone  Type 2 diabetes mellitus without complication, without long-term current use of insulin ([place])  Counseled patient on differential which includes the most likely diagnosis of recurrent renal stone.  Also discussed possibility of PUD, H. pylori infection, diverticulitis, complications of her diabetes.  For now patient will hydrate aggressively, use Flomax and tramadol to address possible renal stone.  ER and return to clinic precautions reviewed.   Jaynee Eagles, [place] 07/07/18 1424

## 2018-07-07 NOTE — ED Triage Notes (Signed)
Pt states she has left flank pain this started today.

## 2018-07-29 ENCOUNTER — Other Ambulatory Visit: Payer: Self-pay | Admitting: Urgent Care

## 2018-09-01 ENCOUNTER — Other Ambulatory Visit: Payer: Self-pay | Admitting: Urgent Care

## 2018-09-04 ENCOUNTER — Other Ambulatory Visit: Payer: Self-pay | Admitting: Family Medicine

## 2018-09-30 ENCOUNTER — Encounter: Payer: Self-pay | Admitting: Family Medicine

## 2018-09-30 ENCOUNTER — Ambulatory Visit (INDEPENDENT_AMBULATORY_CARE_PROVIDER_SITE_OTHER): Payer: BLUE CROSS/BLUE SHIELD | Admitting: Family Medicine

## 2018-09-30 VITALS — BP 116/82 | HR 67 | Temp 98.6°F | Ht 63.0 in | Wt 250.6 lb

## 2018-09-30 DIAGNOSIS — J069 Acute upper respiratory infection, unspecified: Secondary | ICD-10-CM | POA: Insufficient documentation

## 2018-09-30 MED ORDER — ROSUVASTATIN CALCIUM 5 MG PO TABS: 5.0000 mg | ORAL_TABLET | Freq: Every day | ORAL | 3 refills | Status: DC

## 2018-09-30 MED ORDER — METFORMIN HCL 500 MG PO TABS: ORAL_TABLET | ORAL | 3 refills | Status: DC

## 2018-09-30 MED ORDER — PROMETHAZINE-DM 6.25-15 MG/5ML PO SYRP: 5.0000 mL | ORAL_SOLUTION | Freq: Four times a day (QID) | ORAL | 0 refills | Status: DC | PRN

## 2018-09-30 NOTE — Progress Notes (Signed)
Subjective:   Patient ID: April Cooper, female    DOB: 09-27-1966, 52 y.o.   MRN: [id number]  April Cooper is a pleasant 52 y.o. year old female who presents to clinic today with Cough (Patient is here today C/O a cough that started 1-week-ago.  Cough is intermittently productive with yellow sputum.  She has post-nasal-drip and has clear-yellow nasal mucous.  Constant tickle in her throat and the cough is terrible at night time.  Denies any sinus or ear involvement.  She has Tx with Robitussing CF, cough gtt, and Tylenol but Sx persist. )  on 09/30/2018  HPI:  URI symptoms- cough started about a week ago.  It is intermittently productive of yellow sputum. +PND.  Cough is worse at night. No ear pain or sinus pressure. No fevers.  She is treating with Robitussin and Tylenol but symptoms are not improving.  Current Outpatient Medications on File Prior to Visit  Medication Sig Dispense Refill  . metFORMIN (GLUCOPHAGE) 500 MG tablet TAKE 1 TABLET BY MOUTH EVERY DAY WITH BREAKFAST 90 tablet 0  . Multiple Vitamin (MULTIVITAMIN WITH MINERALS) TABS Take 1 tablet by mouth daily.    . rosuvastatin (CRESTOR) 5 MG tablet TAKE 1 TABLET BY MOUTH EVERY DAY 90 tablet 3   No current facility-administered medications on file prior to visit.     Allergies  Allergen Reactions  . Penicillins Rash and Hives    Past Medical History:  Diagnosis Date  . AMA (advanced maternal age) multigravida 63+   . Ectopic pregnancy   . Hyperlipidemia   . Menometrorrhagia 01/2004, 12/2011  . Nephrolithiasis   . OBESITY   . Penicillin allergy 11/04/2002  . Vitamin D deficiency   . Vulvovaginal candidiasis 07/2006    Past Surgical History:  Procedure Laterality Date  . ABLATION  2013  . CYSTOSCOPY WITH RETROGRADE PYELOGRAM, URETEROSCOPY AND STENT PLACEMENT Right 01/16/2013   Procedure: CYSTOSCOPY WITH RETROGRADE PYELOGRAM, URETEROSCOPY AND STENT PLACEMENT;  Surgeon: Dutch Gray, MD;  Location: [facility];   Service: Urology;  Laterality: Right;  . HOLMIUM LASER APPLICATION Right 3/76/2831   Procedure: HOLMIUM LASER APPLICATION;  Surgeon: Dutch Gray, MD;  Location: [facility];  Service: Urology;  Laterality: Right;  . TUBAL LIGATION  2004  . TUBAL LIGATION  2004    Family History  Problem Relation Age of Onset  . Hypertension Mother   . Hypertension Father   . Diabetes Sister   . Diabetes Maternal Aunt   . Hypertension Maternal Grandmother   . Colon cancer Neg Hx     Social History   Socioeconomic History  . Marital status: Married    Spouse name: Not on file  . Number of children: Not on file  . Years of education: Not on file  . Highest education level: Not on file  Occupational History  . Not on file  Social Needs  . Financial resource strain: Not on file  . Food insecurity:    Worry: Not on file    Inability: Not on file  . Transportation needs:    Medical: Not on file    Non-medical: Not on file  Tobacco Use  . Smoking status: Never Smoker  . Smokeless tobacco: Never Used  Substance and Sexual Activity  . Alcohol use: Yes    Comment: twice monthly  . Drug use: No  . Sexual activity: Yes  Lifestyle  . Physical activity:    Days per week: Not on file    Minutes  per session: Not on file  . Stress: Not on file  Relationships  . Social connections:    Talks on phone: Not on file    Gets together: Not on file    Attends religious service: Not on file    Active member of club or organization: Not on file    Attends meetings of clubs or organizations: Not on file    Relationship status: Not on file  . Intimate partner violence:    Fear of current or ex partner: Not on file    Emotionally abused: Not on file    Physically abused: Not on file    Forced sexual activity: Not on file  Other Topics Concern  . Not on file  Social History Narrative  . Not on file   The PMH, PSH, Social History, Family History, Medications, and allergies have been reviewed in [facility], and  have been updated if relevant.   Review of Systems  Constitutional: Negative for fever.  HENT: Positive for congestion and postnasal drip. Negative for sinus pressure, sinus pain, sneezing, sore throat and tinnitus.   Respiratory: Positive for cough. Negative for shortness of breath and wheezing.   Cardiovascular: Negative.   Gastrointestinal: Negative.   Endocrine: Negative.   Genitourinary: Negative.   Musculoskeletal: Negative.   Allergic/Immunologic: Negative.   Neurological: Negative.   Hematological: Negative.   Psychiatric/Behavioral: Negative.   All other systems reviewed and are negative.      Objective:    BP 116/82 (BP Location: Left Arm, Patient Position: Sitting, Cuff Size: Large)   Pulse 67   Temp 98.6 F (37 C) (Oral)   Ht 5\' 3"  (1.6 m)   Wt 250 lb 9.6 oz (113.7 kg)   SpO2 96%   BMI 44.39 kg/m    Physical Exam  BP 116/82 (BP Location: Left Arm, Patient Position: Sitting, Cuff Size: Large)   Pulse 67   Temp 98.6 F (37 C) (Oral)   Ht 5\' 3"  (1.6 m)   Wt 250 lb 9.6 oz (113.7 kg)   SpO2 96%   BMI 44.39 kg/m   She appears well, vital signs are as noted. Ears normal.  Throat and pharynx normal.  Neck supple. No adenopathy in the neck. Nose is congested. Sinuses non tender. The chest is clear, without wheezes or rales.      Assessment & Plan:   Upper respiratory tract infection, unspecified type No follow-ups on file.

## 2018-09-30 NOTE — Assessment & Plan Note (Signed)
Likely viral  Symptomatic therapy suggested: push fluids, rest, gargle warm salt water and return office visit prn if symptoms persist or worsen. Lack of antibiotic effectiveness discussed with her. Call or return to clinic prn if these symptoms worsen or fail to improve as anticipated.

## 2018-09-30 NOTE — Patient Instructions (Addendum)
Great to see you.  Drink plenty of fluids.  Promethazine DM as needed for cough (can make you sleepy).  Update me in a couple of days.    Happy Holidays!

## 2018-10-09 DIAGNOSIS — Z0000 Encounter for general adult medical examination without abnormal findings: Secondary | ICD-10-CM | POA: Insufficient documentation

## 2018-10-09 NOTE — Progress Notes (Signed)
Subjective:   Patient ID: April Cooper, female    DOB: 08/26/1966, 53 y.o.   MRN: [id number]  April Cooper is a pleasant 53 y.o. year old female who presents to clinic today with Annual Exam (Patient is here today for a CPE without PAP.  She had a PAP in May at [facility] as well as Mammogram.  She is currently fasting.)  on 10/10/2018  HPI:  Health Maintenance  Topic Date Due  . HEMOGLOBIN A1C  05/27/2018  . MAMMOGRAM  11/17/2018  . FOOT EXAM  11/27/2018  . URINE MICROALBUMIN  11/27/2018  . OPHTHALMOLOGY EXAM  09/26/2019  . TETANUS/TDAP  10/21/2019  . PAP SMEAR-Modifier  11/15/2019  . COLONOSCOPY  03/09/2022  . INFLUENZA VACCINE  Completed  . PNEUMOCOCCAL POLYSACCHARIDE VACCINE AGE 43-64 HIGH RISK  Completed  . Hepatitis C Screening  Completed  . HIV Screening  Completed    Has GYN- Dr. Mancel Bale- 03/27/18.  New onset diabetes- We checked an a1c in 08/2017 because she was complaining of fatigue, dry mouth and paresthesias. She does have a personal history of gestational diabetes and a family history of DM.  She has been cutting back on carbohydrates since I last saw her. Motivated to reverse this new diagnosis.  Has been compliant with Metformin 500 mg daily with breakfast.  Lab Results  Component Value Date   HGBA1C 6.8 (A) 10/10/2018   HGBA1C 6.8 10/10/2018   HLD-  On crestor 5 mg daily. Lab Results  Component Value Date   CHOL 180 11/27/2017   HDL 55.20 11/27/2017   LDLCALC 100 (H) 11/27/2017   LDLDIRECT 163.0 01/11/2017   TRIG 124.0 11/27/2017   CHOLHDL 3 11/27/2017     Current Outpatient Medications on File Prior to Visit  Medication Sig Dispense Refill  . metFORMIN (GLUCOPHAGE) 500 MG tablet TAKE 1 TABLET BY MOUTH EVERY DAY WITH BREAKFAST 90 tablet 3  . Multiple Vitamin (MULTIVITAMIN WITH MINERALS) TABS Take 1 tablet by mouth daily.    . rosuvastatin (CRESTOR) 5 MG tablet Take 1 tablet (5 mg total) by mouth daily. 90 tablet 3   No  current facility-administered medications on file prior to visit.     Allergies  Allergen Reactions  . Penicillins Rash and Hives    Past Medical History:  Diagnosis Date  . AMA (advanced maternal age) multigravida 88+   . Ectopic pregnancy   . Hyperlipidemia   . Menometrorrhagia 01/2004, 12/2011  . Nephrolithiasis   . OBESITY   . Penicillin allergy 11/04/2002  . Vitamin D deficiency   . Vulvovaginal candidiasis 07/2006    Past Surgical History:  Procedure Laterality Date  . ABLATION  2013  . CYSTOSCOPY WITH RETROGRADE PYELOGRAM, URETEROSCOPY AND STENT PLACEMENT Right 01/16/2013   Procedure: CYSTOSCOPY WITH RETROGRADE PYELOGRAM, URETEROSCOPY AND STENT PLACEMENT;  Surgeon: Dutch Gray, MD;  Location: [facility];  Service: Urology;  Laterality: Right;  . HOLMIUM LASER APPLICATION Right 0/86/5784   Procedure: HOLMIUM LASER APPLICATION;  Surgeon: Dutch Gray, MD;  Location: [facility];  Service: Urology;  Laterality: Right;  . TUBAL LIGATION  2004  . TUBAL LIGATION  2004    Family History  Problem Relation Age of Onset  . Hypertension Mother   . Hypertension Father   . Diabetes Sister   . Diabetes Maternal Aunt   . Hypertension Maternal Grandmother   . Colon cancer Neg Hx     Social History   Socioeconomic History  . Marital status: Married  Spouse name: Not on file  . Number of children: Not on file  . Years of education: Not on file  . Highest education level: Not on file  Occupational History  . Not on file  Social Needs  . Financial resource strain: Not on file  . Food insecurity:    Worry: Not on file    Inability: Not on file  . Transportation needs:    Medical: Not on file    Non-medical: Not on file  Tobacco Use  . Smoking status: Never Smoker  . Smokeless tobacco: Never Used  Substance and Sexual Activity  . Alcohol use: Yes    Comment: twice monthly  . Drug use: No  . Sexual activity: Yes  Lifestyle  . Physical activity:    Days per week: Not on file     Minutes per session: Not on file  . Stress: Not on file  Relationships  . Social connections:    Talks on phone: Not on file    Gets together: Not on file    Attends religious service: Not on file    Active member of club or organization: Not on file    Attends meetings of clubs or organizations: Not on file    Relationship status: Not on file  . Intimate partner violence:    Fear of current or ex partner: Not on file    Emotionally abused: Not on file    Physically abused: Not on file    Forced sexual activity: Not on file  Other Topics Concern  . Not on file  Social History Narrative  . Not on file   The PMH, PSH, Social History, Family History, Medications, and allergies have been reviewed in [facility], and have been updated if relevant.   Review of Systems     Objective:    BP 140/90 (BP Location: Left Arm, Patient Position: Sitting, Cuff Size: Normal)   Pulse 73   Temp 98.3 F (36.8 C) (Oral)   Ht 5' 3.25" (1.607 m)   Wt 250 lb 9.6 oz (113.7 kg)   SpO2 96%   BMI 44.04 kg/m    Physical Exam   General:  Well-developed,well-nourished,in no acute distress; alert,appropriate and cooperative throughout examination Head:  normocephalic and atraumatic.   Eyes:  vision grossly intact, PERRL Ears:  R ear normal and L ear normal externally, TMs clear bilaterally Nose:  no external deformity.   Mouth:  good dentition.   Neck:  No deformities, masses, or tenderness noted. Lungs:  Normal respiratory effort, chest expands symmetrically. Lungs are clear to auscultation, no crackles or wheezes. Heart:  Normal rate and regular rhythm. S1 and S2 normal without gallop, murmur, click, rub or other extra sounds. Abdomen:  Bowel sounds positive,abdomen soft and non-tender without masses, organomegaly or hernias noted. Msk:  No deformity or scoliosis noted of thoracic or lumbar spine.   Extremities:  No clubbing, cyanosis, edema, or deformity noted with normal full range of motion of all  joints.   Neurologic:  alert & oriented X3 and gait normal.   Skin:  Intact without suspicious lesions or rashes Cervical Nodes:  No lymphadenopathy noted Axillary Nodes:  No palpable lymphadenopathy Psych:  Cognition and judgment appear intact. Alert and cooperative with normal attention span and concentration. No apparent delusions, illusions, hallucinations       Assessment & Plan:   Well woman exam without gynecological exam  Diabetes mellitus, new onset ([place]) - Plan: POCT HgB A1C  Hyperlipidemia, unspecified hyperlipidemia  type - Plan: Comp Met (CMET), CBC w/Diff, TSH  Vitamin D deficiency - Plan: VITAMIN D 25 Hydroxy (Vit-D Deficiency, Fractures)  Screening for breast cancer - Plan: CANCELED: MM Digital Screening No follow-ups on file.

## 2018-10-10 ENCOUNTER — Ambulatory Visit (INDEPENDENT_AMBULATORY_CARE_PROVIDER_SITE_OTHER): Payer: 59 | Admitting: Family Medicine

## 2018-10-10 ENCOUNTER — Encounter: Payer: Self-pay | Admitting: Family Medicine

## 2018-10-10 VITALS — BP 140/90 | HR 73 | Temp 98.3°F | Ht 63.25 in | Wt 250.6 lb

## 2018-10-10 DIAGNOSIS — Z0000 Encounter for general adult medical examination without abnormal findings: Secondary | ICD-10-CM | POA: Diagnosis not present

## 2018-10-10 DIAGNOSIS — E119 Type 2 diabetes mellitus without complications: Secondary | ICD-10-CM | POA: Diagnosis not present

## 2018-10-10 DIAGNOSIS — Z1239 Encounter for other screening for malignant neoplasm of breast: Secondary | ICD-10-CM

## 2018-10-10 DIAGNOSIS — E559 Vitamin D deficiency, unspecified: Secondary | ICD-10-CM | POA: Diagnosis not present

## 2018-10-10 DIAGNOSIS — E785 Hyperlipidemia, unspecified: Secondary | ICD-10-CM | POA: Diagnosis not present

## 2018-10-10 LAB — COMPREHENSIVE METABOLIC PANEL
ALT: 23 U/L (ref 0–35)
AST: 15 U/L (ref 0–37)
Albumin: 3.8 g/dL (ref 3.5–5.2)
Alkaline Phosphatase: 54 U/L (ref 39–117)
BUN: 11 mg/dL (ref 6–23)
CO2: 27 mEq/L (ref 19–32)
Calcium: 9.1 mg/dL (ref 8.4–10.5)
Chloride: 104 mEq/L (ref 96–112)
Creatinine, Ser: 0.87 mg/dL (ref 0.40–1.20)
GFR: 87.19 mL/min (ref >60.00)
Glucose, Bld: 136 mg/dL — ABNORMAL HIGH (ref 70–99)
Potassium: 4.2 mEq/L (ref 3.5–5.1)
Sodium: 138 mEq/L (ref 135–145)
Total Bilirubin: 0.4 mg/dL (ref 0.2–1.2)
Total Protein: 7 g/dL (ref 6.0–8.3)

## 2018-10-10 LAB — CBC WITH DIFFERENTIAL/PLATELET
Basophils Absolute: 0 10*3/uL (ref 0.0–0.1)
Basophils Relative: 0.5 % (ref 0.0–3.0)
Eosinophils Absolute: 0.1 10*3/uL (ref 0.0–0.7)
Eosinophils Relative: 1.8 % (ref 0.0–5.0)
HCT: 43.6 % (ref 36.0–46.0)
Hemoglobin: 14.3 g/dL (ref 12.0–15.0)
Lymphocytes Relative: 29.2 % (ref 12.0–46.0)
Lymphs Abs: 2 10*3/uL (ref 0.7–4.0)
MCHC: 32.8 g/dL (ref 30.0–36.0)
MCV: 87 fl (ref 78.0–100.0)
Monocytes Absolute: 0.6 10*3/uL (ref 0.1–1.0)
Monocytes Relative: 9 % (ref 3.0–12.0)
Neutro Abs: 4.2 10*3/uL (ref 1.4–7.7)
Neutrophils Relative %: 59.5 % (ref 43.0–77.0)
Platelets: 318 10*3/uL (ref 150.0–400.0)
RBC: 5.01 Mil/uL (ref 3.87–5.11)
RDW: 14.3 % (ref 11.5–15.5)
WBC: 7 10*3/uL (ref 4.0–10.5)

## 2018-10-10 LAB — POCT GLYCOSYLATED HEMOGLOBIN (HGB A1C)
HbA1c POC (<> result, manual entry): 6.8 % (ref 4.0–5.6)
Hemoglobin A1C: 6.8 % — AB (ref 4.0–5.6)

## 2018-10-10 LAB — VITAMIN D 25 HYDROXY (VIT D DEFICIENCY, FRACTURES): VITD: 15.74 ng/mL — ABNORMAL LOW (ref 30.00–100.00)

## 2018-10-10 LAB — TSH: TSH: 2.89 u[IU]/mL (ref 0.35–4.50)

## 2018-10-10 MED ORDER — HYDROCODONE-HOMATROPINE 5-1.5 MG/5ML PO SYRP: 5.0000 mL | ORAL_SOLUTION | Freq: Three times a day (TID) | ORAL | 0 refills | Status: DC | PRN

## 2018-10-10 NOTE — Assessment & Plan Note (Signed)
Taking crestor 5 mg daily. Due for labs today.

## 2018-10-10 NOTE — Assessment & Plan Note (Signed)
Well controlled.  No changes made. 

## 2018-10-10 NOTE — Patient Instructions (Addendum)
Great to see you. Happy New year!   I will call you with your lab results from today and you can view them online.    Please sign a request for your most recent PAP and Mammogram from [facility] :)

## 2018-10-10 NOTE — Assessment & Plan Note (Signed)
Reviewed preventive care protocols, scheduled due services, and updated immunizations Discussed nutrition, exercise, diet, and healthy lifestyle.  

## 2018-10-11 ENCOUNTER — Telehealth: Payer: Self-pay

## 2018-10-11 DIAGNOSIS — E559 Vitamin D deficiency, unspecified: Secondary | ICD-10-CM

## 2018-10-11 MED ORDER — VITAMIN D (ERGOCALCIFEROL) 1.25 MG (50000 UNIT) PO CAPS: 50000.0000 [IU] | ORAL_CAPSULE | ORAL | 0 refills | Status: DC

## 2018-10-11 NOTE — Telephone Encounter (Signed)
-----   Message from Lucille Passy, MD sent at 10/10/2018  7:28 PM EST ----- Labs look great but VItamin Jemia Fata is low. Please send in  Vitamin April Cooper is low which can make you very tired and achy.  Please call in Vit D3 50,000 units x 12 weeks - 1 tab po weekly x 12 weeks (number 12, no refills), then continue VitD 2000 IU daily.  Recheck in 8-12 weeks.

## 2018-10-11 NOTE — Telephone Encounter (Signed)
Pt aware of lab results/sent in Rx for Vit-Charo Philipp #12 and scheduled lab visit for 3.10.19 @ 8:15am/created future order as well/thx dmf

## 2018-12-17 ENCOUNTER — Telehealth: Payer: Self-pay

## 2018-12-17 ENCOUNTER — Other Ambulatory Visit (INDEPENDENT_AMBULATORY_CARE_PROVIDER_SITE_OTHER): Payer: BLUE CROSS/BLUE SHIELD

## 2018-12-17 DIAGNOSIS — E559 Vitamin D deficiency, unspecified: Secondary | ICD-10-CM | POA: Diagnosis not present

## 2018-12-17 LAB — VITAMIN D 25 HYDROXY (VIT D DEFICIENCY, FRACTURES): VITD: 25.22 ng/mL — ABNORMAL LOW (ref 30.00–100.00)

## 2018-12-17 MED ORDER — VITAMIN D3 1.25 MG (50000 UT) PO TABS: 1.0000 | ORAL_TABLET | ORAL | 0 refills | Status: DC

## 2018-12-17 NOTE — Telephone Encounter (Signed)
-----   Message from Lucille Passy, MD sent at 12/17/2018 12:16 PM EDT ----- Please call pt-  Vitamin D is better but still low.  How much is she currently taking?  Please call in Vit D3 50,000 units x 12 weeks - 1 tab po weekly x 12 weeks (number 12, no refills), then continue VitD 2000 IU daily.  Recheck in 8-12 weeks.

## 2018-12-17 NOTE — Telephone Encounter (Signed)
TA- pt has been taking the Vitamin D 50,000 units weekly that was called in by you     Spoke with pt and informed her of lab results per Dr. Deborra Medina. Pt agreed to try medication and to return for lab only visit for blood draw. Pt requested someone to schedule the appt and she will see the appt via mychart. She had no additional questions at this time. Nothing further is needed

## 2018-12-26 ENCOUNTER — Other Ambulatory Visit: Payer: Self-pay | Admitting: Family Medicine

## 2019-01-22 DIAGNOSIS — M5137 Other intervertebral disc degeneration, lumbosacral region: Secondary | ICD-10-CM | POA: Diagnosis not present

## 2019-01-22 DIAGNOSIS — M545 Low back pain: Secondary | ICD-10-CM | POA: Diagnosis not present

## 2019-01-22 DIAGNOSIS — M5432 Sciatica, left side: Secondary | ICD-10-CM | POA: Diagnosis not present

## 2019-01-22 DIAGNOSIS — M5127 Other intervertebral disc displacement, lumbosacral region: Secondary | ICD-10-CM | POA: Diagnosis not present

## 2019-01-22 DIAGNOSIS — M791 Myalgia, unspecified site: Secondary | ICD-10-CM | POA: Diagnosis not present

## 2019-01-29 DIAGNOSIS — M542 Cervicalgia: Secondary | ICD-10-CM | POA: Diagnosis not present

## 2019-01-29 DIAGNOSIS — M791 Myalgia, unspecified site: Secondary | ICD-10-CM | POA: Diagnosis not present

## 2019-01-29 DIAGNOSIS — M5127 Other intervertebral disc displacement, lumbosacral region: Secondary | ICD-10-CM | POA: Diagnosis not present

## 2019-01-29 DIAGNOSIS — M5432 Sciatica, left side: Secondary | ICD-10-CM | POA: Diagnosis not present

## 2019-01-29 DIAGNOSIS — M545 Low back pain: Secondary | ICD-10-CM | POA: Diagnosis not present

## 2019-01-29 DIAGNOSIS — M5137 Other intervertebral disc degeneration, lumbosacral region: Secondary | ICD-10-CM | POA: Diagnosis not present

## 2019-02-05 DIAGNOSIS — M791 Myalgia, unspecified site: Secondary | ICD-10-CM | POA: Diagnosis not present

## 2019-02-05 DIAGNOSIS — M5127 Other intervertebral disc displacement, lumbosacral region: Secondary | ICD-10-CM | POA: Diagnosis not present

## 2019-02-05 DIAGNOSIS — M545 Low back pain: Secondary | ICD-10-CM | POA: Diagnosis not present

## 2019-02-05 DIAGNOSIS — M5137 Other intervertebral disc degeneration, lumbosacral region: Secondary | ICD-10-CM | POA: Diagnosis not present

## 2019-02-05 DIAGNOSIS — M5432 Sciatica, left side: Secondary | ICD-10-CM | POA: Diagnosis not present

## 2019-02-05 DIAGNOSIS — M542 Cervicalgia: Secondary | ICD-10-CM | POA: Diagnosis not present

## 2019-02-12 DIAGNOSIS — M5432 Sciatica, left side: Secondary | ICD-10-CM | POA: Diagnosis not present

## 2019-02-12 DIAGNOSIS — M545 Low back pain: Secondary | ICD-10-CM | POA: Diagnosis not present

## 2019-02-12 DIAGNOSIS — M791 Myalgia, unspecified site: Secondary | ICD-10-CM | POA: Diagnosis not present

## 2019-02-12 DIAGNOSIS — M5127 Other intervertebral disc displacement, lumbosacral region: Secondary | ICD-10-CM | POA: Diagnosis not present

## 2019-02-12 DIAGNOSIS — M5137 Other intervertebral disc degeneration, lumbosacral region: Secondary | ICD-10-CM | POA: Diagnosis not present

## 2019-02-19 DIAGNOSIS — M791 Myalgia, unspecified site: Secondary | ICD-10-CM | POA: Diagnosis not present

## 2019-02-19 DIAGNOSIS — M545 Low back pain: Secondary | ICD-10-CM | POA: Diagnosis not present

## 2019-02-19 DIAGNOSIS — M5432 Sciatica, left side: Secondary | ICD-10-CM | POA: Diagnosis not present

## 2019-02-19 DIAGNOSIS — M5127 Other intervertebral disc displacement, lumbosacral region: Secondary | ICD-10-CM | POA: Diagnosis not present

## 2019-02-19 DIAGNOSIS — M5137 Other intervertebral disc degeneration, lumbosacral region: Secondary | ICD-10-CM | POA: Diagnosis not present

## 2019-02-28 DIAGNOSIS — M545 Low back pain: Secondary | ICD-10-CM | POA: Diagnosis not present

## 2019-02-28 DIAGNOSIS — M5127 Other intervertebral disc displacement, lumbosacral region: Secondary | ICD-10-CM | POA: Diagnosis not present

## 2019-02-28 DIAGNOSIS — M5137 Other intervertebral disc degeneration, lumbosacral region: Secondary | ICD-10-CM | POA: Diagnosis not present

## 2019-02-28 DIAGNOSIS — M791 Myalgia, unspecified site: Secondary | ICD-10-CM | POA: Diagnosis not present

## 2019-02-28 DIAGNOSIS — M5432 Sciatica, left side: Secondary | ICD-10-CM | POA: Diagnosis not present

## 2019-03-10 DIAGNOSIS — M25551 Pain in right hip: Secondary | ICD-10-CM | POA: Diagnosis not present

## 2019-03-10 DIAGNOSIS — M791 Myalgia, unspecified site: Secondary | ICD-10-CM | POA: Diagnosis not present

## 2019-03-10 DIAGNOSIS — M545 Low back pain: Secondary | ICD-10-CM | POA: Diagnosis not present

## 2019-03-10 DIAGNOSIS — M5137 Other intervertebral disc degeneration, lumbosacral region: Secondary | ICD-10-CM | POA: Diagnosis not present

## 2019-03-10 DIAGNOSIS — M5127 Other intervertebral disc displacement, lumbosacral region: Secondary | ICD-10-CM | POA: Diagnosis not present

## 2019-03-12 DIAGNOSIS — M545 Low back pain: Secondary | ICD-10-CM | POA: Diagnosis not present

## 2019-03-12 DIAGNOSIS — M5137 Other intervertebral disc degeneration, lumbosacral region: Secondary | ICD-10-CM | POA: Diagnosis not present

## 2019-03-12 DIAGNOSIS — M5127 Other intervertebral disc displacement, lumbosacral region: Secondary | ICD-10-CM | POA: Diagnosis not present

## 2019-03-12 DIAGNOSIS — M25551 Pain in right hip: Secondary | ICD-10-CM | POA: Diagnosis not present

## 2019-03-12 DIAGNOSIS — M791 Myalgia, unspecified site: Secondary | ICD-10-CM | POA: Diagnosis not present

## 2019-03-13 ENCOUNTER — Other Ambulatory Visit: Payer: Self-pay | Admitting: Family Medicine

## 2019-03-19 DIAGNOSIS — M5127 Other intervertebral disc displacement, lumbosacral region: Secondary | ICD-10-CM | POA: Diagnosis not present

## 2019-03-19 DIAGNOSIS — M5137 Other intervertebral disc degeneration, lumbosacral region: Secondary | ICD-10-CM | POA: Diagnosis not present

## 2019-03-19 DIAGNOSIS — M791 Myalgia, unspecified site: Secondary | ICD-10-CM | POA: Diagnosis not present

## 2019-03-19 DIAGNOSIS — M25551 Pain in right hip: Secondary | ICD-10-CM | POA: Diagnosis not present

## 2019-04-02 DIAGNOSIS — M25551 Pain in right hip: Secondary | ICD-10-CM | POA: Diagnosis not present

## 2019-04-02 DIAGNOSIS — M5127 Other intervertebral disc displacement, lumbosacral region: Secondary | ICD-10-CM | POA: Diagnosis not present

## 2019-04-02 DIAGNOSIS — M791 Myalgia, unspecified site: Secondary | ICD-10-CM | POA: Diagnosis not present

## 2019-04-02 DIAGNOSIS — M5137 Other intervertebral disc degeneration, lumbosacral region: Secondary | ICD-10-CM | POA: Diagnosis not present

## 2019-04-03 DIAGNOSIS — M25551 Pain in right hip: Secondary | ICD-10-CM | POA: Diagnosis not present

## 2019-04-03 DIAGNOSIS — M5127 Other intervertebral disc displacement, lumbosacral region: Secondary | ICD-10-CM | POA: Diagnosis not present

## 2019-04-03 DIAGNOSIS — M791 Myalgia, unspecified site: Secondary | ICD-10-CM | POA: Diagnosis not present

## 2019-04-03 DIAGNOSIS — M5137 Other intervertebral disc degeneration, lumbosacral region: Secondary | ICD-10-CM | POA: Diagnosis not present

## 2019-04-09 DIAGNOSIS — M5137 Other intervertebral disc degeneration, lumbosacral region: Secondary | ICD-10-CM | POA: Diagnosis not present

## 2019-04-09 DIAGNOSIS — M791 Myalgia, unspecified site: Secondary | ICD-10-CM | POA: Diagnosis not present

## 2019-04-09 DIAGNOSIS — M5127 Other intervertebral disc displacement, lumbosacral region: Secondary | ICD-10-CM | POA: Diagnosis not present

## 2019-04-09 DIAGNOSIS — M25551 Pain in right hip: Secondary | ICD-10-CM | POA: Diagnosis not present

## 2019-06-03 ENCOUNTER — Other Ambulatory Visit: Payer: Self-pay | Admitting: Family Medicine

## 2019-07-18 DIAGNOSIS — Z23 Encounter for immunization: Secondary | ICD-10-CM | POA: Diagnosis not present

## 2019-07-21 DIAGNOSIS — H16223 Keratoconjunctivitis sicca, not specified as Sjogren's, bilateral: Secondary | ICD-10-CM | POA: Diagnosis not present

## 2019-07-21 DIAGNOSIS — H40013 Open angle with borderline findings, low risk, bilateral: Secondary | ICD-10-CM | POA: Diagnosis not present

## 2019-07-21 DIAGNOSIS — H0102A Squamous blepharitis right eye, upper and lower eyelids: Secondary | ICD-10-CM | POA: Diagnosis not present

## 2019-07-21 DIAGNOSIS — H0102B Squamous blepharitis left eye, upper and lower eyelids: Secondary | ICD-10-CM | POA: Diagnosis not present

## 2019-08-07 DIAGNOSIS — Z1231 Encounter for screening mammogram for malignant neoplasm of breast: Secondary | ICD-10-CM | POA: Diagnosis not present

## 2019-08-07 LAB — HM MAMMOGRAPHY

## 2019-08-21 ENCOUNTER — Ambulatory Visit (INDEPENDENT_AMBULATORY_CARE_PROVIDER_SITE_OTHER): Payer: BC Managed Care – PPO | Admitting: Family Medicine

## 2019-08-21 ENCOUNTER — Encounter: Payer: Self-pay | Admitting: Family Medicine

## 2019-08-21 ENCOUNTER — Other Ambulatory Visit: Payer: Self-pay

## 2019-08-21 VITALS — Temp 97.1°F

## 2019-08-21 DIAGNOSIS — Z6841 Body Mass Index (BMI) 40.0 and over, adult: Secondary | ICD-10-CM | POA: Insufficient documentation

## 2019-08-21 DIAGNOSIS — G4489 Other headache syndrome: Secondary | ICD-10-CM | POA: Diagnosis not present

## 2019-08-21 DIAGNOSIS — R519 Headache, unspecified: Secondary | ICD-10-CM | POA: Insufficient documentation

## 2019-08-21 DIAGNOSIS — G4483 Primary cough headache: Secondary | ICD-10-CM | POA: Insufficient documentation

## 2019-08-21 NOTE — Progress Notes (Signed)
TELEPHONE ENCOUNTER   Patient verbally agreed to telephone visit and is aware that copayment and coinsurance may apply. Patient was treated using telemedicine according to accepted telemedicine protocols.  Location of the patient: patient's home Location of provider: Provider's home Names of all persons participating in the telemedicine service and role in the encounter: Arnette Norris, MD Curtis Sites Subjective:   Chief Complaint  Patient presents with  . Headache    Pt agrees to virtual visit.  She is having intermittent H/A's x 2wks. Her eyesight changed so now she has to wear glasses to read. She has been going to the dentist due to teeth griding at night. The pain is behind nose and behind left side of eye and left temple. She feels that this may be a sinus infection.     HPI  HA- She is having intermittent H/A's x 2wks. Her eyesight changed so now she has to wear glasses to read. She has been going to the dentist due to teeth griding at night.   The pain is behind nose and behind left side of eye and left temple. She feels that this may be a sinus infection.  She has had no fevers, chills, or upper respiratory.  Pressure isn't any worse when she presses down on her sinuses and bends over.   She has a dentist appointment next week.  Patient Active Problem List   Diagnosis Date Noted  . Well woman exam without gynecological exam 10/09/2018  . Back pain at L4-L5 level 02/06/2018  . HLD (hyperlipidemia) 11/27/2017  . Diabetes mellitus, new onset ([place]) 08/27/2017  . S/P endometrial ablation 04/08/2012  . Vitamin D deficiency   . Obesity 09/28/2010   Social History   Tobacco Use  . Smoking status: Never Smoker  . Smokeless tobacco: Never Used  Substance Use Topics  . Alcohol use: Yes    Comment: twice monthly    Current Outpatient Medications:  .  Cholecalciferol (VITAMIN D3) 1.25 MG (50000 UT) TABS, Take 1 tablet by mouth once a week., Disp: 12 tablet, Rfl: 0 .   cycloSPORINE (RESTASIS) 0.05 % ophthalmic emulsion, Restasis 0.05 % eye drops in a dropperette, Disp: , Rfl:  .  metFORMIN (GLUCOPHAGE) 500 MG tablet, TAKE 1 TABLET BY MOUTH EVERY DAY WITH BREAKFAST, Disp: 90 tablet, Rfl: 3 .  Multiple Vitamin (MULTIVITAMIN WITH MINERALS) TABS, Take 1 tablet by mouth daily., Disp: , Rfl:  .  rosuvastatin (CRESTOR) 5 MG tablet, Take 1 tablet (5 mg total) by mouth daily., Disp: 90 tablet, Rfl: 3 .  Vitamin D, Ergocalciferol, (DRISDOL) 1.25 MG (50000 UT) CAPS capsule, TAKE 1 CAPSULE (50,000 UNITS TOTAL) BY MOUTH EVERY 7 (SEVEN) DAYS., Disp: 12 capsule, Rfl: 0 Allergies  Allergen Reactions  . Penicillins Rash and Hives    Assessment & Plan:   No diagnosis found.  No orders of the defined types were placed in this encounter.  No orders of the defined types were placed in this encounter.   Arnette Norris, MD 08/21/2019  Time spent with the patient: 21 minutes, spent in obtaining information about her symptoms, reviewing her previous labs, evaluations, and treatments, counseling her about her condition (please see the discussed topics above), and developing a plan to further investigate it; she had a number of questions which I addressed.   [id number] physician/qualified health professional telephone evaluation 5 to 10 minutes [id number] physician/qualified help functional [place] evaluation for 11 to 20 minutes [id number] physician/qualify he will professional telephone evaluation for 21 to  30 minutes

## 2019-08-21 NOTE — Assessment & Plan Note (Signed)
Likely multifactorial.  We both agreed that at this time, we would hold off on abx as no symptoms seem consistent with a sinus infection.  She will call me on my cell phone after her dentist appointment next week to update me.  I also put her on my schedule to follow up in person as well.  The patient indicates understanding of these issues and agrees with the plan.

## 2019-09-08 ENCOUNTER — Telehealth (INDEPENDENT_AMBULATORY_CARE_PROVIDER_SITE_OTHER): Payer: BC Managed Care – PPO | Admitting: Family Medicine

## 2019-09-08 ENCOUNTER — Encounter: Payer: Self-pay | Admitting: Family Medicine

## 2019-09-08 VITALS — BP 140/80 | Ht 63.25 in | Wt 238.0 lb

## 2019-09-08 DIAGNOSIS — Z6841 Body Mass Index (BMI) 40.0 and over, adult: Secondary | ICD-10-CM

## 2019-09-08 DIAGNOSIS — E119 Type 2 diabetes mellitus without complications: Secondary | ICD-10-CM

## 2019-09-08 DIAGNOSIS — E785 Hyperlipidemia, unspecified: Secondary | ICD-10-CM

## 2019-09-08 DIAGNOSIS — E559 Vitamin D deficiency, unspecified: Secondary | ICD-10-CM

## 2019-09-08 MED ORDER — METFORMIN HCL 500 MG PO TABS: ORAL_TABLET | ORAL | 3 refills | Status: DC

## 2019-09-08 MED ORDER — ROSUVASTATIN CALCIUM 5 MG PO TABS: 5.0000 mg | ORAL_TABLET | Freq: Every day | ORAL | 3 refills | Status: DC

## 2019-09-08 NOTE — Assessment & Plan Note (Signed)
Congratulated her on her success and encouraged her to continue working on diet and exercise. The patient indicates understanding of these issues and agrees with the plan.

## 2019-09-08 NOTE — Assessment & Plan Note (Addendum)
Congratulated her on weight loss - continue to  minimize simple carbs. Increase exercise as tolerated. Continue current meds- check a1c and additional labs. On statin.  Orders Placed This Encounter  Procedures  . Hemoglobin A1c  . Comprehensive metabolic panel  . Lipid panel  . TSH  . CBC with Differential/Platelet  . Vitamin D (25 hydroxy)  . DM- urine micro

## 2019-09-08 NOTE — Assessment & Plan Note (Addendum)
Tolerating statin, encouraged heart healthy diet, avoid trans fats, minimize simple carbs and saturated fats. Increase exercise as tolerated- will recheck labs but given her high ascvd score- I would like for her to continue statin.  Will await results. The patient indicates understanding of these issues and agrees with the plan. The 10-year ASCVD risk score Mikey Bussing DC Brooke Bonito., et al., 2013) is: 9.2%   Values used to calculate the score:     Age: 53 years     Sex: Female     Is Non-Hispanic African American: Yes     Diabetic: Yes     Tobacco smoker: No     Systolic Blood Pressure: [id number] mmHg     Is BP treated: No     HDL Cholesterol: 55.2 mg/dL     Total Cholesterol: 180 mg/dL

## 2019-09-08 NOTE — Assessment & Plan Note (Signed)
Repeat Vit. D 

## 2019-09-08 NOTE — Progress Notes (Signed)
Virtual Visit via Video   Due to the COVID-19 pandemic, this visit was completed with telemedicine (audio/video) technology to reduce patient and provider exposure as well as to preserve personal protective equipment.   I connected with April Cooper by a video enabled telemedicine application and verified that I am speaking with the correct person using two identifiers. Location patient: Home Location provider: [facility], Office Persons participating in the virtual visit: Boneta Lucks, MD   I discussed the limitations of evaluation and management by telemedicine and the availability of in person appointments. The patient expressed understanding and agreed to proceed.  Interactive audio and video telecommunications were attempted between this provider and patient, however failed, due to patient having technical difficulties OR patient did not have access to video capability.  We continued and completed visit with audio only.  Care Team   Patient Care Team: Lucille Passy, MD as PCP - General (Family Medicine) Everett Graff, MD as Consulting Physician (Obstetrics and Gynecology)  Subjective:   HPI:   DM- We checked an a1cin 08/2017 becauseshe was complaining of fatigue, dry mouth and paresthesias. She does have a personal history of gestational diabetes and a family history of DM.   Has been compliant with Metformin 500 mg daily with breakfast.  Has not been checking blood sugars.  Lab Results  Component Value Date   HGBA1C 6.8 (A) 10/10/2018   HGBA1C 6.8 10/10/2018   Denies any symptoms of hypoglycemia.  Has lost 12 pounds since January- cut back on portions. Wt Readings from Last 3 Encounters:  09/08/19 238 lb (108 kg)  10/10/18 250 lb 9.6 oz (113.7 kg)  09/30/18 250 lb 9.6 oz (113.7 kg)     HLD-  On crestor 5 mg daily.   Lab Results  Component Value Date   CHOL 180 11/27/2017   HDL 55.20 11/27/2017   LDLCALC 100 (H) 11/27/2017    LDLDIRECT 163.0 01/11/2017   TRIG 124.0 11/27/2017   CHOLHDL 3 11/27/2017   The 10-year ASCVD risk score Mikey Bussing DC Jr., et al., 2013) is: 9.2%   Values used to calculate the score:     Age: 52 years     Sex: Female     Is Non-Hispanic African American: Yes     Diabetic: Yes     Tobacco smoker: No     Systolic Blood Pressure: [id number] mmHg     Is BP treated: No     HDL Cholesterol: 55.2 mg/dL     Total Cholesterol: 180 mg/dL  Review of Systems  Constitutional: Negative.   HENT: Negative.   Respiratory: Negative.   Cardiovascular: Negative.   Gastrointestinal: Negative.   Genitourinary: Negative.   Musculoskeletal: Negative.   Skin: Negative.   Neurological: Negative.   Hematological: Negative.   Psychiatric/Behavioral: Negative.   All other systems reviewed and are negative.   Patient Active Problem List   Diagnosis Date Noted  . Class 3 severe obesity due to excess calories with serious comorbidity and body mass index (BMI) of 40.0 to 44.9 in adult [facility]) 08/21/2019  . Headache 08/21/2019  . Well woman exam without gynecological exam 10/09/2018  . Back pain at L4-L5 level 02/06/2018  . HLD (hyperlipidemia) 11/27/2017  . Controlled type 2 diabetes mellitus without complication, without long-term current use of insulin ([place]) 08/27/2017  . S/P endometrial ablation 04/08/2012  . Vitamin D deficiency   . Obesity 09/28/2010    Social History   Tobacco Use  . Smoking  status: Never Smoker  . Smokeless tobacco: Never Used  Substance Use Topics  . Alcohol use: Yes    Comment: twice monthly    Current Outpatient Medications:  .  Cholecalciferol (VITAMIN D3) 1.25 MG (50000 UT) TABS, Take 1 tablet by mouth once a week., Disp: 12 tablet, Rfl: 0 .  cycloSPORINE (RESTASIS) 0.05 % ophthalmic emulsion, Restasis 0.05 % eye drops in a dropperette, Disp: , Rfl:  .  metFORMIN (GLUCOPHAGE) 500 MG tablet, TAKE 1 TABLET BY MOUTH EVERY DAY WITH BREAKFAST, Disp: 90 tablet, Rfl: 3 .  Multiple  Vitamin (MULTIVITAMIN WITH MINERALS) TABS, Take 1 tablet by mouth daily., Disp: , Rfl:  .  rosuvastatin (CRESTOR) 5 MG tablet, Take 1 tablet (5 mg total) by mouth daily., Disp: 90 tablet, Rfl: 3  Allergies  Allergen Reactions  . Penicillins Rash and Hives    Objective:  BP 140/80   Ht 5' 3.25" (1.607 m)   Wt 238 lb (108 kg)   BMI 41.83 kg/m    BP Readings from Last 3 Encounters:  09/08/19 140/80  10/10/18 140/90  09/30/18 116/82     VITALS: Per patient if applicable, see vitals. GENERAL: Alert, appears well and in no acute distress. HEENT: Atraumatic, conjunctiva clear, no obvious abnormalities on inspection of external nose and ears. NECK: Normal movements of the head and neck. CARDIOPULMONARY: No increased WOB. Speaking in clear sentences. I:E ratio WNL.  MS: Moves all visible extremities without noticeable abnormality. PSYCH: Pleasant and cooperative, well-groomed. Speech normal rate and rhythm. Affect is appropriate. Insight and judgement are appropriate. Attention is focused, linear, and appropriate.  NEURO: CN grossly intact. Oriented as arrived to appointment on time with no prompting. Moves both UE equally.  SKIN: No obvious lesions, wounds, erythema, or cyanosis noted on face or hands.  Depression screen [facility] 2/9 09/30/2018 08/16/2017  Decreased Interest 0 0  Down, Depressed, Hopeless 0 0  PHQ - 2 Score 0 0    Assessment and Plan:   April Cooper was seen today for diabetes.  Diagnoses and all orders for this visit:  Controlled type 2 diabetes mellitus without complication, without long-term current use of insulin (HCC) -     Hemoglobin A1c -     Comprehensive metabolic panel -     DM- urine micro  Hyperlipidemia, unspecified hyperlipidemia type -     Lipid panel -     TSH -     CBC with Differential/Platelet  Vitamin D deficiency -     Vitamin D (25 hydroxy)  Class 3 severe obesity due to excess calories with serious comorbidity and body mass index (BMI) of  40.0 to 44.9 in adult [facility])    . COVID-19 Education: The signs and symptoms of COVID-19 were discussed with the patient and how to seek care for testing if needed. The importance of social distancing was discussed today. . Reviewed expectations re: course of current medical issues. . Discussed self-management of symptoms. . Outlined signs and symptoms indicating need for more acute intervention. . Patient verbalized understanding and all questions were answered. Marland Kitchen Health Maintenance issues including appropriate healthy diet, exercise, and smoking avoidance were discussed with patient. . See orders for this visit as documented in the electronic medical record.  Arnette Norris, MD  Records requested if needed. Time spent: 25 minutes, of which >50% was spent in obtaining information about her symptoms, reviewing her previous labs, evaluations, and treatments, counseling her about her condition (please see the discussed topics above), and developing a  plan to further investigate it; she had a number of questions which I addressed.        Assessment & Plan:   Problem List Items Addressed This Visit      Active Problems   Vitamin D deficiency   Relevant Orders   Vitamin D (25 hydroxy)   Controlled type 2 diabetes mellitus without complication, without long-term current use of insulin ([place]) - Primary   Relevant Orders   Hemoglobin A1c   Comprehensive metabolic panel   DM- urine micro   HLD (hyperlipidemia)    Tolerating statin, encouraged heart healthy diet, avoid trans fats, minimize simple carbs and saturated fats. Increase exercise as tolerated       Relevant Orders   Lipid panel   TSH   CBC with Differential/Platelet   Class 3 severe obesity due to excess calories with serious comorbidity and body mass index (BMI) of 40.0 to 44.9 in adult [facility])      I am having April Cooper maintain her multivitamin with minerals, metFORMIN, rosuvastatin, Vitamin D3, and Restasis.  No  orders of the defined types were placed in this encounter.    Arnette Norris, MD

## 2019-09-10 ENCOUNTER — Other Ambulatory Visit (INDEPENDENT_AMBULATORY_CARE_PROVIDER_SITE_OTHER): Payer: BC Managed Care – PPO

## 2019-09-10 ENCOUNTER — Encounter: Payer: Self-pay | Admitting: Family Medicine

## 2019-09-10 ENCOUNTER — Other Ambulatory Visit: Payer: Self-pay

## 2019-09-10 DIAGNOSIS — E559 Vitamin D deficiency, unspecified: Secondary | ICD-10-CM | POA: Diagnosis not present

## 2019-09-10 DIAGNOSIS — E119 Type 2 diabetes mellitus without complications: Secondary | ICD-10-CM

## 2019-09-10 DIAGNOSIS — E785 Hyperlipidemia, unspecified: Secondary | ICD-10-CM

## 2019-09-10 LAB — COMPREHENSIVE METABOLIC PANEL
ALT: 26 U/L (ref 0–35)
AST: 14 U/L (ref 0–37)
Albumin: 4 g/dL (ref 3.5–5.2)
Alkaline Phosphatase: 85 U/L (ref 39–117)
BUN: 14 mg/dL (ref 6–23)
CO2: 27 mEq/L (ref 19–32)
Calcium: 9.4 mg/dL (ref 8.4–10.5)
Chloride: 103 mEq/L (ref 96–112)
Creatinine, Ser: 0.77 mg/dL (ref 0.40–1.20)
GFR: 94.13 mL/min (ref >60.00)
Glucose, Bld: 241 mg/dL — ABNORMAL HIGH (ref 70–99)
Potassium: 4.3 mEq/L (ref 3.5–5.1)
Sodium: 138 mEq/L (ref 135–145)
Total Bilirubin: 0.4 mg/dL (ref 0.2–1.2)
Total Protein: 7 g/dL (ref 6.0–8.3)

## 2019-09-10 LAB — CBC WITH DIFFERENTIAL/PLATELET
Basophils Absolute: 0 10*3/uL (ref 0.0–0.1)
Basophils Relative: 0.5 % (ref 0.0–3.0)
Eosinophils Absolute: 0.2 10*3/uL (ref 0.0–0.7)
Eosinophils Relative: 2.2 % (ref 0.0–5.0)
HCT: 44.6 % (ref 36.0–46.0)
Hemoglobin: 14.4 g/dL (ref 12.0–15.0)
Lymphocytes Relative: 34.7 % (ref 12.0–46.0)
Lymphs Abs: 2.5 10*3/uL (ref 0.7–4.0)
MCHC: 32.4 g/dL (ref 30.0–36.0)
MCV: 87.5 fl (ref 78.0–100.0)
Monocytes Absolute: 0.7 10*3/uL (ref 0.1–1.0)
Monocytes Relative: 9.5 % (ref 3.0–12.0)
Neutro Abs: 3.9 10*3/uL (ref 1.4–7.7)
Neutrophils Relative %: 53.1 % (ref 43.0–77.0)
Platelets: 315 10*3/uL (ref 150.0–400.0)
RBC: 5.09 Mil/uL (ref 3.87–5.11)
RDW: 13.9 % (ref 11.5–15.5)
WBC: 7.3 10*3/uL (ref 4.0–10.5)

## 2019-09-10 LAB — LIPID PANEL
Cholesterol: 179 mg/dL (ref 0–200)
HDL: 53.5 mg/dL (ref >39.00)
LDL Cholesterol: 102 mg/dL — ABNORMAL HIGH (ref 0–99)
NonHDL: 125.62
Total CHOL/HDL Ratio: 3
Triglycerides: 117 mg/dL (ref 0.0–149.0)
VLDL: 23.4 mg/dL (ref 0.0–40.0)

## 2019-09-10 LAB — MICROALBUMIN / CREATININE URINE RATIO
Creatinine,U: 278.1 mg/dL
Microalb Creat Ratio: 1 mg/g (ref 0.0–30.0)
Microalb, Ur: 2.9 mg/dL — ABNORMAL HIGH (ref 0.0–1.9)

## 2019-09-10 LAB — HEMOGLOBIN A1C: Hgb A1c MFr Bld: 12.3 % — ABNORMAL HIGH (ref 4.6–6.5)

## 2019-09-10 LAB — TSH: TSH: 3.24 u[IU]/mL (ref 0.35–4.50)

## 2019-09-10 LAB — VITAMIN D 25 HYDROXY (VIT D DEFICIENCY, FRACTURES): VITD: 20.56 ng/mL — ABNORMAL LOW (ref 30.00–100.00)

## 2019-09-11 ENCOUNTER — Other Ambulatory Visit: Payer: Self-pay | Admitting: Family Medicine

## 2019-09-11 ENCOUNTER — Other Ambulatory Visit: Payer: Self-pay

## 2019-09-11 DIAGNOSIS — E103599 Type 1 diabetes mellitus with proliferative diabetic retinopathy without macular edema, unspecified eye: Secondary | ICD-10-CM

## 2019-09-11 DIAGNOSIS — E1065 Type 1 diabetes mellitus with hyperglycemia: Secondary | ICD-10-CM

## 2019-09-11 DIAGNOSIS — E559 Vitamin D deficiency, unspecified: Secondary | ICD-10-CM

## 2019-09-11 DIAGNOSIS — E119 Type 2 diabetes mellitus without complications: Secondary | ICD-10-CM

## 2019-09-11 MED ORDER — BLOOD GLUCOSE METER KIT: PACK | 0 refills | Status: AC

## 2019-09-11 MED ORDER — BLOOD GLUCOSE METER KIT: PACK | 0 refills | Status: DC

## 2019-09-11 MED ORDER — VITAMIN D (ERGOCALCIFEROL) 1.25 MG (50000 UNIT) PO CAPS: 50000.0000 [IU] | ORAL_CAPSULE | ORAL | 0 refills | Status: DC

## 2019-09-11 MED ORDER — METFORMIN HCL 500 MG PO TABS: 500.0000 mg | ORAL_TABLET | Freq: Two times a day (BID) | ORAL | 3 refills | Status: DC

## 2019-09-11 NOTE — Telephone Encounter (Signed)
I am not sure if you got my message.  I can't see to see it in the chart- I called in a new rx for metformin 500 twice daily with meals.  I have placed future orders.   Please chedule

## 2019-09-15 ENCOUNTER — Other Ambulatory Visit: Payer: Self-pay

## 2019-09-15 MED ORDER — METFORMIN HCL 500 MG PO TABS: 500.0000 mg | ORAL_TABLET | Freq: Two times a day (BID) | ORAL | 3 refills | Status: DC

## 2019-09-17 ENCOUNTER — Encounter: Payer: Self-pay | Admitting: Family Medicine

## 2019-09-17 DIAGNOSIS — Z01419 Encounter for gynecological examination (general) (routine) without abnormal findings: Secondary | ICD-10-CM | POA: Diagnosis not present

## 2019-09-17 DIAGNOSIS — N898 Other specified noninflammatory disorders of vagina: Secondary | ICD-10-CM | POA: Diagnosis not present

## 2019-09-17 DIAGNOSIS — Z113 Encounter for screening for infections with a predominantly sexual mode of transmission: Secondary | ICD-10-CM | POA: Diagnosis not present

## 2019-09-17 DIAGNOSIS — Z124 Encounter for screening for malignant neoplasm of cervix: Secondary | ICD-10-CM | POA: Diagnosis not present

## 2019-09-17 DIAGNOSIS — N9489 Other specified conditions associated with female genital organs and menstrual cycle: Secondary | ICD-10-CM | POA: Diagnosis not present

## 2019-09-17 LAB — HM PAP SMEAR: HM Pap smear: NEGATIVE

## 2019-09-19 ENCOUNTER — Other Ambulatory Visit: Payer: Self-pay | Admitting: Family Medicine

## 2019-09-19 DIAGNOSIS — E1165 Type 2 diabetes mellitus with hyperglycemia: Secondary | ICD-10-CM

## 2019-09-20 ENCOUNTER — Other Ambulatory Visit: Payer: Self-pay

## 2019-09-20 DIAGNOSIS — Z20822 Contact with and (suspected) exposure to covid-19: Secondary | ICD-10-CM

## 2019-09-22 LAB — NOVEL CORONAVIRUS, NAA: SARS-CoV-2, NAA: NOT DETECTED

## 2019-09-26 ENCOUNTER — Other Ambulatory Visit: Payer: Self-pay | Admitting: Family Medicine

## 2019-10-01 NOTE — Telephone Encounter (Signed)
Last ov 09/08/19

## 2019-10-28 ENCOUNTER — Encounter: Payer: BC Managed Care – PPO | Attending: Family Medicine | Admitting: Dietician

## 2019-10-28 ENCOUNTER — Other Ambulatory Visit: Payer: Self-pay

## 2019-10-28 ENCOUNTER — Encounter: Payer: Self-pay | Admitting: Dietician

## 2019-10-28 DIAGNOSIS — E119 Type 2 diabetes mellitus without complications: Secondary | ICD-10-CM | POA: Insufficient documentation

## 2019-10-28 DIAGNOSIS — IMO0002 Reserved for concepts with insufficient information to code with codable children: Secondary | ICD-10-CM

## 2019-10-28 DIAGNOSIS — E1165 Type 2 diabetes mellitus with hyperglycemia: Secondary | ICD-10-CM | POA: Diagnosis not present

## 2019-10-28 DIAGNOSIS — E118 Type 2 diabetes mellitus with unspecified complications: Secondary | ICD-10-CM | POA: Insufficient documentation

## 2019-10-28 NOTE — Progress Notes (Signed)
Patient was seen on 10/28/2019 for the first of a series of three diabetes self-management courses at [facility].  Patient Education Plan per assessed needs and concerns is to attend three course education program for Diabetes Self Management Education.  The following learning objectives were met by the patient during this class:  Describe diabetes, types of diabetes and pathophysiology  State some common risk factors for diabetes  Defines the role of glucose and insulin  Describe the relationship between diabetes and cardiovascular and other risks  State the members of the Healthcare Team  States the rationale for glucose monitoring and when to test  State their individual Target Range  State the importance of logging glucose readings and how to interpret the readings  Identifies A1C target  Explain the correlation between A1c and eAG values  State symptoms and treatment of high blood glucose and low blood glucose  Explain proper technique for glucose testing and identify proper sharps disposal  Handouts given during class include:  How to Thrive:  A Guide for Your Journey with Diabetes by the ADA  Meal Plan Card and carbohydrate content list  Dietary intake form  Low Sodium Flavoring Tips  Types of Fats  Dining Out  Label reading  Snack list  Planning a balanced meal  The diabetes portion plate  Diabetes Resources  A1c to eAG Conversion Chart  Blood Glucose Log  Diabetes Recommended Care Schedule  Support Group  Diabetes Success Plan  Core Class Satisfaction Survey   Follow-Up Plan:  Attend core 2   

## 2019-11-04 ENCOUNTER — Encounter: Payer: Self-pay | Admitting: Dietician

## 2019-11-04 ENCOUNTER — Other Ambulatory Visit: Payer: Self-pay

## 2019-11-04 ENCOUNTER — Encounter: Payer: BC Managed Care – PPO | Admitting: Dietician

## 2019-11-04 DIAGNOSIS — E1165 Type 2 diabetes mellitus with hyperglycemia: Secondary | ICD-10-CM | POA: Diagnosis not present

## 2019-11-04 DIAGNOSIS — E119 Type 2 diabetes mellitus without complications: Secondary | ICD-10-CM | POA: Diagnosis not present

## 2019-11-04 DIAGNOSIS — E118 Type 2 diabetes mellitus with unspecified complications: Secondary | ICD-10-CM | POA: Diagnosis not present

## 2019-11-04 NOTE — Progress Notes (Signed)
Patient was seen on 11/04/2019 for the second of a series of three diabetes self-management courses at [facility]. The following learning objectives were met by the patient during this class:   Describe the role of different macronutrients on glucose  Explain how carbohydrates affect blood glucose  State what foods contain the most carbohydrates  Demonstrate carbohydrate counting  Demonstrate how to read Nutrition Facts food label  Describe effects of various fats on heart health  Describe the importance of good nutrition for health and healthy eating strategies  Describe techniques for managing your shopping, cooking and meal planning  List strategies to follow meal plan when dining out  Describe the effects of alcohol on glucose and how to use it safely  Goals:  Follow Diabetes Meal Plan as instructed  Aim to spread carbs evenly throughout the day  Aim for 3 meals per day and snacks as needed Include lean protein foods to meals/snacks  Monitor glucose levels as instructed by your doctor   Follow-Up Plan:  Attend Core 3  Work towards following your personal food plan.

## 2019-11-11 ENCOUNTER — Encounter: Payer: Self-pay | Admitting: Dietician

## 2019-11-11 ENCOUNTER — Encounter: Payer: BC Managed Care – PPO | Attending: Family Medicine | Admitting: Dietician

## 2019-11-11 ENCOUNTER — Other Ambulatory Visit: Payer: Self-pay

## 2019-11-11 DIAGNOSIS — E118 Type 2 diabetes mellitus with unspecified complications: Secondary | ICD-10-CM | POA: Diagnosis not present

## 2019-11-11 DIAGNOSIS — E119 Type 2 diabetes mellitus without complications: Secondary | ICD-10-CM | POA: Insufficient documentation

## 2019-11-11 DIAGNOSIS — E1165 Type 2 diabetes mellitus with hyperglycemia: Secondary | ICD-10-CM | POA: Diagnosis not present

## 2019-11-11 NOTE — Progress Notes (Signed)
Patient was seen on 11/11/19 for the third of a series of three diabetes self-management courses at [facility].   April Cooper the amount of activity recommended for healthy living . Describe activities suitable for individual needs . Identify ways to regularly incorporate activity into daily life . Identify barriers to activity and ways to over come these barriers  Identify diabetes medications being personally used and their primary action for lowering glucose and possible side effects . Describe role of stress on blood glucose and develop strategies to address psychosocial issues . Identify diabetes complications and ways to prevent them  Explain how to manage diabetes during illness . Evaluate success in meeting personal goal . Establish 2-3 goals that they will plan to diligently work on  Goals:   I will count my carb choices at most meals and snacks  I will be active 15-30 minutes or more 3-4 times a week  Your patient has identified these potential barriers to change:  Stress (work related)  Your patient has identified their diabetes self-care support plan as  [employer] Website    Plan:  Attend Support Group as desired

## 2019-11-12 ENCOUNTER — Other Ambulatory Visit (INDEPENDENT_AMBULATORY_CARE_PROVIDER_SITE_OTHER): Payer: BC Managed Care – PPO

## 2019-11-12 ENCOUNTER — Other Ambulatory Visit: Payer: Self-pay

## 2019-11-12 DIAGNOSIS — E1065 Type 1 diabetes mellitus with hyperglycemia: Secondary | ICD-10-CM

## 2019-11-12 DIAGNOSIS — E103599 Type 1 diabetes mellitus with proliferative diabetic retinopathy without macular edema, unspecified eye: Secondary | ICD-10-CM | POA: Diagnosis not present

## 2019-11-12 DIAGNOSIS — E559 Vitamin D deficiency, unspecified: Secondary | ICD-10-CM | POA: Diagnosis not present

## 2019-11-12 LAB — COMPREHENSIVE METABOLIC PANEL
ALT: 21 U/L (ref 0–35)
AST: 15 U/L (ref 0–37)
Albumin: 4.1 g/dL (ref 3.5–5.2)
Alkaline Phosphatase: 63 U/L (ref 39–117)
BUN: 11 mg/dL (ref 6–23)
CO2: 27 mEq/L (ref 19–32)
Calcium: 9 mg/dL (ref 8.4–10.5)
Chloride: 105 mEq/L (ref 96–112)
Creatinine, Ser: 0.74 mg/dL (ref 0.40–1.20)
GFR: 98.49 mL/min (ref >60.00)
Glucose, Bld: 147 mg/dL — ABNORMAL HIGH (ref 70–99)
Potassium: 4.2 mEq/L (ref 3.5–5.1)
Sodium: 139 mEq/L (ref 135–145)
Total Bilirubin: 0.4 mg/dL (ref 0.2–1.2)
Total Protein: 7 g/dL (ref 6.0–8.3)

## 2019-11-12 LAB — HEMOGLOBIN A1C: Hgb A1c MFr Bld: 9.4 % — ABNORMAL HIGH (ref 4.6–6.5)

## 2019-11-12 LAB — VITAMIN D 25 HYDROXY (VIT D DEFICIENCY, FRACTURES): VITD: 27.48 ng/mL — ABNORMAL LOW (ref 30.00–100.00)

## 2019-11-13 ENCOUNTER — Other Ambulatory Visit: Payer: Self-pay

## 2019-11-13 DIAGNOSIS — E559 Vitamin D deficiency, unspecified: Secondary | ICD-10-CM

## 2019-11-13 MED ORDER — VITAMIN D (ERGOCALCIFEROL) 1.25 MG (50000 UNIT) PO CAPS: 50000.0000 [IU] | ORAL_CAPSULE | ORAL | 0 refills | Status: DC

## 2019-11-16 ENCOUNTER — Other Ambulatory Visit: Payer: Self-pay | Admitting: Family Medicine

## 2019-11-16 MED ORDER — VITAMIN D3 1.25 MG (50000 UT) PO TABS: 1.0000 | ORAL_TABLET | ORAL | 0 refills | Status: DC

## 2019-11-16 MED ORDER — METFORMIN HCL 1000 MG PO TABS: 1000.0000 mg | ORAL_TABLET | Freq: Two times a day (BID) | ORAL | 3 refills | Status: DC

## 2020-02-13 ENCOUNTER — Other Ambulatory Visit: Payer: Self-pay

## 2020-02-13 MED ORDER — VITAMIN D3 1.25 MG (50000 UT) PO TABS: ORAL_TABLET | ORAL | 0 refills | Status: DC

## 2020-07-23 DIAGNOSIS — Z23 Encounter for immunization: Secondary | ICD-10-CM | POA: Diagnosis not present

## 2020-07-28 ENCOUNTER — Encounter: Payer: BC Managed Care – PPO | Admitting: Family Medicine

## 2020-08-03 ENCOUNTER — Encounter: Payer: Self-pay | Admitting: Family Medicine

## 2020-08-03 ENCOUNTER — Ambulatory Visit (INDEPENDENT_AMBULATORY_CARE_PROVIDER_SITE_OTHER): Payer: BC Managed Care – PPO | Admitting: Family Medicine

## 2020-08-03 ENCOUNTER — Other Ambulatory Visit: Payer: Self-pay

## 2020-08-03 VITALS — BP 150/90 | HR 62 | Temp 96.7°F | Ht 64.0 in | Wt 240.5 lb

## 2020-08-03 DIAGNOSIS — E559 Vitamin D deficiency, unspecified: Secondary | ICD-10-CM

## 2020-08-03 DIAGNOSIS — I10 Essential (primary) hypertension: Secondary | ICD-10-CM | POA: Insufficient documentation

## 2020-08-03 DIAGNOSIS — Z23 Encounter for immunization: Secondary | ICD-10-CM

## 2020-08-03 DIAGNOSIS — E119 Type 2 diabetes mellitus without complications: Secondary | ICD-10-CM

## 2020-08-03 DIAGNOSIS — E785 Hyperlipidemia, unspecified: Secondary | ICD-10-CM | POA: Diagnosis not present

## 2020-08-03 DIAGNOSIS — Z6841 Body Mass Index (BMI) 40.0 and over, adult: Secondary | ICD-10-CM

## 2020-08-03 LAB — LIPID PANEL
Cholesterol: 154 mg/dL (ref 0–200)
HDL: 53.3 mg/dL (ref >39.00)
LDL Cholesterol: 81 mg/dL (ref 0–99)
NonHDL: 100.73
Total CHOL/HDL Ratio: 3
Triglycerides: 98 mg/dL (ref 0.0–149.0)
VLDL: 19.6 mg/dL (ref 0.0–40.0)

## 2020-08-03 LAB — POCT GLYCOSYLATED HEMOGLOBIN (HGB A1C): Hemoglobin A1C: 7.9 % — AB (ref 4.0–5.6)

## 2020-08-03 LAB — BASIC METABOLIC PANEL
BUN: 7 mg/dL (ref 6–23)
CO2: 28 mEq/L (ref 19–32)
Calcium: 9.3 mg/dL (ref 8.4–10.5)
Chloride: 103 mEq/L (ref 96–112)
Creatinine, Ser: 0.73 mg/dL (ref 0.40–1.20)
GFR: 92.2 mL/min (ref >60.00)
Glucose, Bld: 106 mg/dL — ABNORMAL HIGH (ref 70–99)
Potassium: 4.2 mEq/L (ref 3.5–5.1)
Sodium: 139 mEq/L (ref 135–145)

## 2020-08-03 LAB — VITAMIN D 25 HYDROXY (VIT D DEFICIENCY, FRACTURES): VITD: 24.81 ng/mL — ABNORMAL LOW (ref 30.00–100.00)

## 2020-08-03 MED ORDER — METFORMIN HCL 1000 MG PO TABS: 1000.0000 mg | ORAL_TABLET | Freq: Every evening | ORAL | 3 refills | Status: DC

## 2020-08-03 MED ORDER — OZEMPIC (0.25 OR 0.5 MG/DOSE) 2 MG/1.5ML ~~LOC~~ SOPN: 0.2500 mg | PEN_INJECTOR | SUBCUTANEOUS | 1 refills | Status: DC

## 2020-08-03 MED ORDER — LOSARTAN POTASSIUM 50 MG PO TABS: 50.0000 mg | ORAL_TABLET | Freq: Every day | ORAL | 0 refills | Status: DC

## 2020-08-03 MED ORDER — ACCU-CHEK FASTCLIX LANCETS MISC: 1.0000 | Freq: Three times a day (TID) | 3 refills | Status: DC

## 2020-08-03 NOTE — Assessment & Plan Note (Signed)
BP elevated in pt with DM. Discussed ACE-I or ARB and she is concerned about angioedema risk. Elected to try ARB.

## 2020-08-03 NOTE — Assessment & Plan Note (Signed)
Has been on weekly replacement. Will recheck and encouraging switching to daily supplement pending results

## 2020-08-03 NOTE — Assessment & Plan Note (Signed)
Significant improvement, but still not at goal. She can only tolerate metformin once daily due to side effects. Discussed starting ozempic as sister is using and will have weight loss support given obesity. Start ozempic 0.25 mg. Cont metformin 1000 mg once daily.

## 2020-08-03 NOTE — Patient Instructions (Addendum)
Check with insurance if Ozempic is too costly  And just update me if they suggest alternative  Start Losartan 50 mg  Return in 4 week

## 2020-08-03 NOTE — Assessment & Plan Note (Signed)
ozempic to try and help with weight loss and DM control. Encouraged continued exercise and healthy eating

## 2020-08-03 NOTE — Progress Notes (Signed)
Subjective:     April Cooper is a 54 y.o. female presenting for Establish Care and Rash (irritation under skin fold on R flank area )     HPI   #Diabetes Currently taking metformin (Glucophage, Riomet)  Using medications without difficulties: Yes - stomach upset Hypoglycemic episodes:No  Hyperglycemic episodes:No  Feet problems:No  Blood Sugars averaging: 120-130 fasting Last HgbA1c:  Lab Results  Component Value Date   HGBA1C 7.9 (A) 08/03/2020   Diet: not bad, trying to reduce carbs and increasing veggies, more chicken and fish  Has primarily only been taking the metformin at nighttime due to side effects from the medication   Diabetes Health Maintenance Due:    Diabetes Health Maintenance Due  Topic Date Due  . FOOT EXAM  11/27/2018  . OPHTHALMOLOGY EXAM  11/11/2020  . HEMOGLOBIN A1C  02/01/2021    #HTN - has been told in the past    Review of Systems   Social History   Tobacco Use  Smoking Status Never Smoker  Smokeless Tobacco Never Used        Objective:    BP Readings from Last 3 Encounters:  08/03/20 (!) 150/90  09/08/19 140/80  10/10/18 140/90   Wt Readings from Last 3 Encounters:  08/03/20 240 lb 8 oz (109.1 kg)  10/28/19 240 lb (108.9 kg)  09/08/19 238 lb (108 kg)    BP (!) 150/90   Pulse 62   Temp (!) 96.7 F (35.9 C) (Temporal)   Ht 5\' 4"  (1.626 m)   Wt 240 lb 8 oz (109.1 kg)   SpO2 98%   BMI 41.28 kg/m    Physical Exam Constitutional:      General: She is not in acute distress.    Appearance: She is well-developed. She is not diaphoretic.  HENT:     Right Ear: External ear normal.     Left Ear: External ear normal.  Eyes:     Conjunctiva/sclera: Conjunctivae normal.  Cardiovascular:     Rate and Rhythm: Normal rate and regular rhythm.     Heart sounds: No murmur heard.   Pulmonary:     Effort: Pulmonary effort is normal. No respiratory distress.     Breath sounds: Normal breath sounds. No wheezing.    Musculoskeletal:     Cervical back: Neck supple.  Skin:    General: Skin is warm and dry.     Capillary Refill: Capillary refill takes less than 2 seconds.  Neurological:     Mental Status: She is alert. Mental status is at baseline.  Psychiatric:        Mood and Affect: Mood normal.        Behavior: Behavior normal.           Assessment & Plan:   Problem List Items Addressed This Visit      Cardiovascular and Mediastinum   Essential hypertension    BP elevated in pt with DM. Discussed ACE-I or ARB and she is concerned about angioedema risk. Elected to try ARB.       Relevant Medications   losartan (COZAAR) 50 MG tablet   Other Relevant Orders   Basic metabolic panel     Endocrine   Controlled type 2 diabetes mellitus without complication, without long-term current use of insulin (HCC) - Primary    Significant improvement, but still not at goal. She can only tolerate metformin once daily due to side effects. Discussed starting ozempic as sister is using and will  have weight loss support given obesity. Start ozempic 0.25 mg. Cont metformin 1000 mg once daily.       Relevant Medications   Accu-Chek FastClix Lancets MISC   metFORMIN (GLUCOPHAGE) 1000 MG tablet   Semaglutide,0.25 or 0.5MG /DOS, (OZEMPIC, 0.25 OR 0.5 MG/DOSE,) 2 MG/1.5ML SOPN   losartan (COZAAR) 50 MG tablet   Other Relevant Orders   POCT glycosylated hemoglobin (Hb A1C) (Completed)   Basic metabolic panel   Lipid panel     Other   Vitamin D deficiency    Has been on weekly replacement. Will recheck and encouraging switching to daily supplement pending results      Relevant Orders   Vitamin D, 25-hydroxy   HLD (hyperlipidemia)    Tolerating statin. Will recheck and make sure LDL is at goal for DM. Cont crestor 5 mg      Relevant Medications   losartan (COZAAR) 50 MG tablet   Other Relevant Orders   Lipid panel   Class 3 severe obesity due to excess calories with serious comorbidity and body  mass index (BMI) of 40.0 to 44.9 in adult ([place])    ozempic to try and help with weight loss and DM control. Encouraged continued exercise and healthy eating      Relevant Medications   metFORMIN (GLUCOPHAGE) 1000 MG tablet   Semaglutide,0.25 or 0.5MG /DOS, (OZEMPIC, 0.25 OR 0.5 MG/DOSE,) 2 MG/1.5ML SOPN    Other Visit Diagnoses    Need for Tdap vaccination       Relevant Orders   Tdap vaccine greater than or equal to 56yo IM (Completed)       No follow-ups on file.  Lesleigh Noe, MD  This visit occurred during the SARS-CoV-2 public health emergency.  Safety protocols were in place, including screening questions prior to the visit, additional usage of staff PPE, and extensive cleaning of exam room while observing appropriate contact time as indicated for disinfecting solutions.

## 2020-08-03 NOTE — Assessment & Plan Note (Addendum)
Tolerating statin. Will recheck and make sure LDL is at goal for DM. Cont crestor 5 mg

## 2020-09-01 ENCOUNTER — Encounter: Payer: Self-pay | Admitting: Family Medicine

## 2020-09-01 ENCOUNTER — Ambulatory Visit (INDEPENDENT_AMBULATORY_CARE_PROVIDER_SITE_OTHER): Payer: BC Managed Care – PPO | Admitting: Family Medicine

## 2020-09-01 ENCOUNTER — Other Ambulatory Visit: Payer: Self-pay

## 2020-09-01 VITALS — BP 118/76 | HR 70 | Temp 96.6°F | Ht 64.0 in | Wt 238.8 lb

## 2020-09-01 DIAGNOSIS — E119 Type 2 diabetes mellitus without complications: Secondary | ICD-10-CM | POA: Diagnosis not present

## 2020-09-01 DIAGNOSIS — I10 Essential (primary) hypertension: Secondary | ICD-10-CM

## 2020-09-01 LAB — BASIC METABOLIC PANEL
BUN: 10 mg/dL (ref 6–23)
CO2: 27 mEq/L (ref 19–32)
Calcium: 9.3 mg/dL (ref 8.4–10.5)
Chloride: 106 mEq/L (ref 96–112)
Creatinine, Ser: 0.86 mg/dL (ref 0.40–1.20)
GFR: 75.69 mL/min (ref >60.00)
Glucose, Bld: 118 mg/dL — ABNORMAL HIGH (ref 70–99)
Potassium: 3.9 mEq/L (ref 3.5–5.1)
Sodium: 139 mEq/L (ref 135–145)

## 2020-09-01 MED ORDER — OZEMPIC (0.25 OR 0.5 MG/DOSE) 2 MG/1.5ML ~~LOC~~ SOPN: 0.5000 mg | PEN_INJECTOR | SUBCUTANEOUS | 1 refills | Status: DC

## 2020-09-01 NOTE — Assessment & Plan Note (Signed)
CBGs improved at home on ozempic and metformin. Cont metformin 1000 mg daily. Increase ozempic to 0.5 mg weekly.

## 2020-09-01 NOTE — Progress Notes (Signed)
Subjective:     April Cooper is a 54 y.o. female presenting for Follow-up (4 weeks )     HPI   #DM - started ozempic - fasting in the morning is 136 - did not check today - post prandial was 150-160s - tolerating well  #HTN - blood pressure controlled - tolerating losartan - no cp, sob, ha   Review of Systems   Social History   Tobacco Use  Smoking Status Never Smoker  Smokeless Tobacco Never Used        Objective:    BP Readings from Last 3 Encounters:  09/01/20 118/76  08/03/20 (!) 150/90  09/08/19 140/80   Wt Readings from Last 3 Encounters:  09/01/20 238 lb 12 oz (108.3 kg)  08/03/20 240 lb 8 oz (109.1 kg)  10/28/19 240 lb (108.9 kg)    BP 118/76   Pulse 70   Temp (!) 96.6 F (35.9 C) (Temporal)   Ht 5\' 4"  (1.626 m)   Wt 238 lb 12 oz (108.3 kg)   SpO2 97%   BMI 40.98 kg/m    Physical Exam Constitutional:      General: She is not in acute distress.    Appearance: She is well-developed. She is not diaphoretic.  HENT:     Right Ear: External ear normal.     Left Ear: External ear normal.     Nose: Nose normal.  Eyes:     Conjunctiva/sclera: Conjunctivae normal.  Cardiovascular:     Rate and Rhythm: Normal rate and regular rhythm.  Pulmonary:     Effort: Pulmonary effort is normal. No respiratory distress.     Breath sounds: Normal breath sounds. No wheezing.  Musculoskeletal:     Cervical back: Neck supple.  Skin:    General: Skin is warm and dry.     Capillary Refill: Capillary refill takes less than 2 seconds.  Neurological:     Mental Status: She is alert. Mental status is at baseline.  Psychiatric:        Mood and Affect: Mood normal.        Behavior: Behavior normal.      Diabetic Foot Exam - Simple   Simple Foot Form Diabetic Foot exam was performed with the following findings: Yes 09/01/2020  8:20 AM  Visual Inspection No deformities, no ulcerations, no other skin breakdown bilaterally: Yes Sensation  Testing Intact to touch and monofilament testing bilaterally: Yes Pulse Check Posterior Tibialis and Dorsalis pulse intact bilaterally: Yes Comments        Assessment & Plan:   Problem List Items Addressed This Visit      Cardiovascular and Mediastinum   Essential hypertension - Primary    Improved and controlled. Labs today. Cont losartan 50 mg daily      Relevant Orders   Basic metabolic panel     Endocrine   Controlled type 2 diabetes mellitus without complication, without long-term current use of insulin (HCC)    CBGs improved at home on ozempic and metformin. Cont metformin 1000 mg daily. Increase ozempic to 0.5 mg weekly.       Relevant Medications   Semaglutide,0.25 or 0.5MG /DOS, (OZEMPIC, 0.25 OR 0.5 MG/DOSE,) 2 MG/1.5ML SOPN       Return in about 3 months (around 12/02/2020).  Lesleigh Noe, MD  This visit occurred during the SARS-CoV-2 public health emergency.  Safety protocols were in place, including screening questions prior to the visit, additional usage of staff PPE, and extensive cleaning  of exam room while observing appropriate contact time as indicated for disinfecting solutions.

## 2020-09-01 NOTE — Assessment & Plan Note (Signed)
Improved and controlled. Labs today. Cont losartan 50 mg daily

## 2020-09-01 NOTE — Patient Instructions (Addendum)
Great work!  Blood work today   Continue medications - Losartan 50 mg  Increase ozempic to 0.5 mg daily

## 2020-09-30 DIAGNOSIS — H2513 Age-related nuclear cataract, bilateral: Secondary | ICD-10-CM | POA: Diagnosis not present

## 2020-09-30 DIAGNOSIS — H40013 Open angle with borderline findings, low risk, bilateral: Secondary | ICD-10-CM | POA: Diagnosis not present

## 2020-09-30 DIAGNOSIS — H0288B Meibomian gland dysfunction left eye, upper and lower eyelids: Secondary | ICD-10-CM | POA: Diagnosis not present

## 2020-09-30 DIAGNOSIS — Z124 Encounter for screening for malignant neoplasm of cervix: Secondary | ICD-10-CM | POA: Diagnosis not present

## 2020-09-30 DIAGNOSIS — H0288A Meibomian gland dysfunction right eye, upper and lower eyelids: Secondary | ICD-10-CM | POA: Diagnosis not present

## 2020-09-30 DIAGNOSIS — Z01419 Encounter for gynecological examination (general) (routine) without abnormal findings: Secondary | ICD-10-CM | POA: Diagnosis not present

## 2020-09-30 DIAGNOSIS — H16223 Keratoconjunctivitis sicca, not specified as Sjogren's, bilateral: Secondary | ICD-10-CM | POA: Diagnosis not present

## 2020-09-30 DIAGNOSIS — Z1231 Encounter for screening mammogram for malignant neoplasm of breast: Secondary | ICD-10-CM | POA: Diagnosis not present

## 2020-09-30 DIAGNOSIS — Z6841 Body Mass Index (BMI) 40.0 and over, adult: Secondary | ICD-10-CM | POA: Diagnosis not present

## 2020-09-30 LAB — HM MAMMOGRAPHY

## 2020-09-30 LAB — RESULTS CONSOLE HPV: CHL HPV: NEGATIVE

## 2020-09-30 LAB — HM DIABETES EYE EXAM

## 2020-09-30 LAB — HM PAP SMEAR: HM Pap smear: NEGATIVE

## 2020-10-12 ENCOUNTER — Other Ambulatory Visit: Payer: Self-pay

## 2020-10-12 MED ORDER — ROSUVASTATIN CALCIUM 5 MG PO TABS
5.0000 mg | ORAL_TABLET | Freq: Every day | ORAL | 3 refills | Status: DC
Start: 2020-10-12 — End: 2021-08-23

## 2020-10-20 ENCOUNTER — Telehealth: Payer: Self-pay

## 2020-10-20 NOTE — Telephone Encounter (Signed)
Pt's daughter tested positive yesterday for covid with a home test. She said she lives in same household. Her daughter has symptoms but she doesn't. She said she has a headache but that isn't out of the ordinary. She wants to know when she should be tested for covid since her job will not let her come back to work without a note from her doctor and a negative test result?

## 2020-10-20 NOTE — Telephone Encounter (Signed)
Spoke to pt and suggested that she should wait 5 days from the onset of her daughters symptoms before she tests. Pt was informed of the mass testing site being held at [facility] and their hours. Pt will go there to test tomorrow and will let us know her results when she gets them.

## 2020-10-21 DIAGNOSIS — Z1152 Encounter for screening for COVID-19: Secondary | ICD-10-CM | POA: Diagnosis not present

## 2020-10-28 ENCOUNTER — Other Ambulatory Visit: Payer: Self-pay | Admitting: Family Medicine

## 2020-10-28 DIAGNOSIS — I10 Essential (primary) hypertension: Secondary | ICD-10-CM

## 2020-11-25 DIAGNOSIS — M25512 Pain in left shoulder: Secondary | ICD-10-CM | POA: Diagnosis not present

## 2020-12-06 ENCOUNTER — Ambulatory Visit (INDEPENDENT_AMBULATORY_CARE_PROVIDER_SITE_OTHER): Payer: BC Managed Care – PPO | Admitting: Family Medicine

## 2020-12-06 ENCOUNTER — Encounter: Payer: Self-pay | Admitting: Family Medicine

## 2020-12-06 ENCOUNTER — Other Ambulatory Visit: Payer: Self-pay

## 2020-12-06 VITALS — BP 138/80 | HR 65 | Temp 97.5°F | Ht 64.0 in | Wt 242.8 lb

## 2020-12-06 DIAGNOSIS — E785 Hyperlipidemia, unspecified: Secondary | ICD-10-CM | POA: Diagnosis not present

## 2020-12-06 DIAGNOSIS — E119 Type 2 diabetes mellitus without complications: Secondary | ICD-10-CM

## 2020-12-06 DIAGNOSIS — I10 Essential (primary) hypertension: Secondary | ICD-10-CM

## 2020-12-06 LAB — POCT GLYCOSYLATED HEMOGLOBIN (HGB A1C): Hemoglobin A1C: 7.7 % — AB (ref 4.0–5.6)

## 2020-12-06 MED ORDER — OZEMPIC (1 MG/DOSE) 4 MG/3ML ~~LOC~~ SOPN: 1.0000 mg | PEN_INJECTOR | SUBCUTANEOUS | 3 refills | Status: DC

## 2020-12-06 NOTE — Assessment & Plan Note (Signed)
Lab Results  Component Value Date   LDLCALC 81 08/03/2020   Slightly above goal. Will continue crestor 5 mg and increase if still elevated after glucose control.

## 2020-12-06 NOTE — Progress Notes (Signed)
Subjective:     April Cooper is a 55 y.o. female presenting for Follow-up (3 month- DM )     HPI  #Diabetes Currently taking metformin (Glucophage, Riomet) and ozempic Using medications without difficulties: Yes Hypoglycemic episodes:No  Hyperglycemic episodes:No  Feet problems:No  Blood Sugars averaging: has not been checking recently Last HgbA1c:  Lab Results  Component Value Date   HGBA1C 7.7 (A) 12/06/2020   Diet: not as good recently due to a lot of stress  Diabetes Health Maintenance Due:    Diabetes Health Maintenance Due  Topic Date Due  . OPHTHALMOLOGY EXAM  09/26/2019  . HEMOGLOBIN A1C  06/05/2021  . FOOT EXAM  09/01/2021   #HTN - tolerating medication    Review of Systems  09/01/2020: Clinic - HTN - controlled. DM - increase ozempic  Social History   Tobacco Use  Smoking Status Never Smoker  Smokeless Tobacco Never Used        Objective:    BP Readings from Last 3 Encounters:  12/06/20 138/80  09/01/20 118/76  08/03/20 (!) 150/90   Wt Readings from Last 3 Encounters:  12/06/20 242 lb 12 oz (110.1 kg)  09/01/20 238 lb 12 oz (108.3 kg)  08/03/20 240 lb 8 oz (109.1 kg)    BP 138/80   Pulse 65   Temp (!) 97.5 F (36.4 C) (Temporal)   Ht 5\' 4"  (1.626 m)   Wt 242 lb 12 oz (110.1 kg)   SpO2 100%   BMI 41.67 kg/m    Physical Exam Constitutional:      General: She is not in acute distress.    Appearance: She is well-developed. She is not diaphoretic.  HENT:     Right Ear: External ear normal.     Left Ear: External ear normal.  Eyes:     Conjunctiva/sclera: Conjunctivae normal.  Cardiovascular:     Rate and Rhythm: Normal rate and regular rhythm.  Pulmonary:     Effort: Pulmonary effort is normal. No respiratory distress.     Breath sounds: Normal breath sounds. No wheezing.  Musculoskeletal:     Cervical back: Neck supple.  Skin:    General: Skin is warm and dry.     Capillary Refill: Capillary refill takes less  than 2 seconds.  Neurological:     Mental Status: She is alert. Mental status is at baseline.  Psychiatric:        Mood and Affect: Mood normal.        Behavior: Behavior normal.           Assessment & Plan:   Problem List Items Addressed This Visit      Cardiovascular and Mediastinum   Essential hypertension    Controlled. Cont losartan 50 mg daily        Endocrine   Controlled type 2 diabetes mellitus without complication, without long-term current use of insulin (HCC) - Primary    C/b HTN. Not at goal. Increase ozempic 0.5>1 mg daily. Cont metformin 1000 mg daily.       Relevant Medications   Semaglutide, 1 MG/DOSE, (OZEMPIC, 1 MG/DOSE,) 4 MG/3ML SOPN   Other Relevant Orders   POCT glycosylated hemoglobin (Hb A1C) (Completed)     Other   HLD (hyperlipidemia)    Lab Results  Component Value Date   LDLCALC 81 08/03/2020   Slightly above goal. Will continue crestor 5 mg and increase if still elevated after glucose control.  Return in about 3 months (around 03/05/2021).  Lesleigh Noe, MD  This visit occurred during the SARS-CoV-2 public health emergency.  Safety protocols were in place, including screening questions prior to the visit, additional usage of staff PPE, and extensive cleaning of exam room while observing appropriate contact time as indicated for disinfecting solutions.

## 2020-12-06 NOTE — Patient Instructions (Signed)
Increase Ozempic to 1 mg daily  Return in 3 months  Continue metformin and to work on diet

## 2020-12-06 NOTE — Assessment & Plan Note (Signed)
C/b HTN. Not at goal. Increase ozempic 0.5>1 mg daily. Cont metformin 1000 mg daily.

## 2020-12-06 NOTE — Assessment & Plan Note (Signed)
Controlled. Cont losartan 50 mg daily

## 2020-12-15 ENCOUNTER — Encounter: Payer: Self-pay | Admitting: Family Medicine

## 2021-02-06 ENCOUNTER — Other Ambulatory Visit: Payer: Self-pay | Admitting: Family Medicine

## 2021-02-06 DIAGNOSIS — I10 Essential (primary) hypertension: Secondary | ICD-10-CM

## 2021-03-08 ENCOUNTER — Other Ambulatory Visit: Payer: Self-pay

## 2021-03-08 ENCOUNTER — Ambulatory Visit (INDEPENDENT_AMBULATORY_CARE_PROVIDER_SITE_OTHER): Payer: BC Managed Care – PPO | Admitting: Family Medicine

## 2021-03-08 VITALS — BP 138/78 | HR 64 | Temp 98.1°F | Ht 64.0 in | Wt 238.0 lb

## 2021-03-08 DIAGNOSIS — E119 Type 2 diabetes mellitus without complications: Secondary | ICD-10-CM | POA: Diagnosis not present

## 2021-03-08 DIAGNOSIS — E785 Hyperlipidemia, unspecified: Secondary | ICD-10-CM | POA: Diagnosis not present

## 2021-03-08 DIAGNOSIS — E1169 Type 2 diabetes mellitus with other specified complication: Secondary | ICD-10-CM

## 2021-03-08 DIAGNOSIS — I10 Essential (primary) hypertension: Secondary | ICD-10-CM

## 2021-03-08 LAB — POCT GLYCOSYLATED HEMOGLOBIN (HGB A1C): Hemoglobin A1C: 7 % — AB (ref 4.0–5.6)

## 2021-03-08 MED ORDER — OZEMPIC (2 MG/DOSE) 8 MG/3ML ~~LOC~~ SOPN: 2.0000 mg | PEN_INJECTOR | SUBCUTANEOUS | 3 refills | Status: DC

## 2021-03-08 NOTE — Patient Instructions (Signed)
Increase ozempic to 2 mg weekly - new prescription  Update if needed  Return in October for physical

## 2021-03-08 NOTE — Assessment & Plan Note (Signed)
Lab Results  Component Value Date   LDLCALC 81 08/03/2020   Cont crestor 5 mg. Will check at next OV

## 2021-03-08 NOTE — Assessment & Plan Note (Signed)
Lab Results  Component Value Date   HGBA1C 7.0 (A) 03/08/2021   Improved. Discussed increasing Ozempic 1mg  > 2mg  to continue control and help with weight loss. Cont metformin 1000 mg daily.

## 2021-03-08 NOTE — Assessment & Plan Note (Signed)
BP at goal. Cont losartan 50 mg

## 2021-03-08 NOTE — Progress Notes (Signed)
Subjective:     April Cooper is a 55 y.o. female presenting for Follow-up (3 mo- DM )     HPI  #Diabetes Currently taking metformin and ozempic  Using medications without difficulties: No Hypoglycemic episodes:no Hyperglycemic episodes:No  Feet problems:No  Blood Sugars averaging: 120s Last HgbA1c:  Lab Results  Component Value Date   HGBA1C 7.0 (A) 03/08/2021    Diabetes Health Maintenance Due:    Diabetes Health Maintenance Due  Topic Date Due  . FOOT EXAM  09/01/2021  . HEMOGLOBIN A1C  09/07/2021  . OPHTHALMOLOGY EXAM  09/30/2021   #HTN - doing well - taking losartan     Review of Systems  12/06/2020: Clinic - DM - increase ozempic to 1 mg, cont metformin  Social History   Tobacco Use  Smoking Status Never Smoker  Smokeless Tobacco Never Used        Objective:    BP Readings from Last 3 Encounters:  03/08/21 138/78  12/06/20 138/80  09/01/20 118/76   Wt Readings from Last 3 Encounters:  03/08/21 238 lb (108 kg)  12/06/20 242 lb 12 oz (110.1 kg)  09/01/20 238 lb 12 oz (108.3 kg)    BP 138/78   Pulse 64   Temp 98.1 F (36.7 C) (Temporal)   Ht 5\' 4"  (1.626 m)   Wt 238 lb (108 kg)   SpO2 99%   BMI 40.85 kg/m    Physical Exam Constitutional:      General: She is not in acute distress.    Appearance: She is well-developed. She is not diaphoretic.  HENT:     Right Ear: External ear normal.     Left Ear: External ear normal.     Nose: Nose normal.  Eyes:     Conjunctiva/sclera: Conjunctivae normal.  Cardiovascular:     Rate and Rhythm: Normal rate and regular rhythm.     Heart sounds: No murmur heard.   Pulmonary:     Effort: Pulmonary effort is normal. No respiratory distress.     Breath sounds: Normal breath sounds. No wheezing.  Musculoskeletal:     Cervical back: Neck supple.  Skin:    General: Skin is warm and dry.     Capillary Refill: Capillary refill takes less than 2 seconds.  Neurological:     Mental Status:  She is alert. Mental status is at baseline.  Psychiatric:        Mood and Affect: Mood normal.        Behavior: Behavior normal.           Assessment & Plan:   Problem List Items Addressed This Visit      Cardiovascular and Mediastinum   Essential hypertension    BP at goal. Cont losartan 50 mg        Endocrine   Controlled type 2 diabetes mellitus without complication, without long-term current use of insulin (HCC) - Primary    Lab Results  Component Value Date   HGBA1C 7.0 (A) 03/08/2021   Improved. Discussed increasing Ozempic 1mg  > 2mg  to continue control and help with weight loss. Cont metformin 1000 mg daily.       Relevant Medications   Semaglutide, 2 MG/DOSE, (OZEMPIC, 2 MG/DOSE,) 8 MG/3ML SOPN   Other Relevant Orders   POCT glycosylated hemoglobin (Hb A1C) (Completed)   Hyperlipidemia associated with type 2 diabetes mellitus [facility])    Lab Results  Component Value Date   LDLCALC 81 08/03/2020   Cont crestor 5  mg. Will check at next OV      Relevant Medications   Semaglutide, 2 MG/DOSE, (OZEMPIC, 2 MG/DOSE,) 8 MG/3ML SOPN       Return in about 5 months (around 08/08/2021) for For annual physical exam .  Lesleigh Noe, MD  This visit occurred during the SARS-CoV-2 public health emergency.  Safety protocols were in place, including screening questions prior to the visit, additional usage of staff PPE, and extensive cleaning of exam room while observing appropriate contact time as indicated for disinfecting solutions.

## 2021-04-04 DIAGNOSIS — M25562 Pain in left knee: Secondary | ICD-10-CM | POA: Diagnosis not present

## 2021-04-25 DIAGNOSIS — H16223 Keratoconjunctivitis sicca, not specified as Sjogren's, bilateral: Secondary | ICD-10-CM | POA: Diagnosis not present

## 2021-04-25 DIAGNOSIS — H0288A Meibomian gland dysfunction right eye, upper and lower eyelids: Secondary | ICD-10-CM | POA: Diagnosis not present

## 2021-04-25 DIAGNOSIS — H2513 Age-related nuclear cataract, bilateral: Secondary | ICD-10-CM | POA: Diagnosis not present

## 2021-04-25 DIAGNOSIS — H40013 Open angle with borderline findings, low risk, bilateral: Secondary | ICD-10-CM | POA: Diagnosis not present

## 2021-05-18 ENCOUNTER — Other Ambulatory Visit: Payer: Self-pay | Admitting: Family Medicine

## 2021-05-18 DIAGNOSIS — I10 Essential (primary) hypertension: Secondary | ICD-10-CM

## 2021-06-09 DIAGNOSIS — H0288A Meibomian gland dysfunction right eye, upper and lower eyelids: Secondary | ICD-10-CM | POA: Diagnosis not present

## 2021-06-09 DIAGNOSIS — H40013 Open angle with borderline findings, low risk, bilateral: Secondary | ICD-10-CM | POA: Diagnosis not present

## 2021-06-09 DIAGNOSIS — H16223 Keratoconjunctivitis sicca, not specified as Sjogren's, bilateral: Secondary | ICD-10-CM | POA: Diagnosis not present

## 2021-06-09 DIAGNOSIS — H2513 Age-related nuclear cataract, bilateral: Secondary | ICD-10-CM | POA: Diagnosis not present

## 2021-06-09 LAB — HM DIABETES EYE EXAM

## 2021-06-19 ENCOUNTER — Other Ambulatory Visit: Payer: Self-pay | Admitting: Family Medicine

## 2021-06-19 DIAGNOSIS — E119 Type 2 diabetes mellitus without complications: Secondary | ICD-10-CM

## 2021-07-17 ENCOUNTER — Other Ambulatory Visit: Payer: Self-pay | Admitting: Family Medicine

## 2021-07-17 DIAGNOSIS — E119 Type 2 diabetes mellitus without complications: Secondary | ICD-10-CM

## 2021-07-19 ENCOUNTER — Other Ambulatory Visit: Payer: Self-pay

## 2021-07-19 ENCOUNTER — Ambulatory Visit (INDEPENDENT_AMBULATORY_CARE_PROVIDER_SITE_OTHER): Payer: BC Managed Care – PPO | Admitting: Family Medicine

## 2021-07-19 VITALS — BP 142/82 | HR 63 | Temp 96.0°F | Ht 63.5 in | Wt 224.2 lb

## 2021-07-19 DIAGNOSIS — E785 Hyperlipidemia, unspecified: Secondary | ICD-10-CM

## 2021-07-19 DIAGNOSIS — Z0000 Encounter for general adult medical examination without abnormal findings: Secondary | ICD-10-CM

## 2021-07-19 DIAGNOSIS — Z23 Encounter for immunization: Secondary | ICD-10-CM

## 2021-07-19 DIAGNOSIS — I10 Essential (primary) hypertension: Secondary | ICD-10-CM

## 2021-07-19 DIAGNOSIS — E1169 Type 2 diabetes mellitus with other specified complication: Secondary | ICD-10-CM

## 2021-07-19 DIAGNOSIS — E559 Vitamin D deficiency, unspecified: Secondary | ICD-10-CM

## 2021-07-19 DIAGNOSIS — E119 Type 2 diabetes mellitus without complications: Secondary | ICD-10-CM | POA: Diagnosis not present

## 2021-07-19 MED ORDER — METFORMIN HCL 1000 MG PO TABS: 1000.0000 mg | ORAL_TABLET | Freq: Every evening | ORAL | 3 refills | Status: DC

## 2021-07-19 NOTE — Patient Instructions (Signed)
Continue medications Continue exercise and diabetic diet  Return in 6 months for diabetes check-in

## 2021-07-19 NOTE — Assessment & Plan Note (Signed)
Slightly elevated. Cont losartan 50 mg

## 2021-07-19 NOTE — Progress Notes (Signed)
Annual Exam   Chief Complaint:  Chief Complaint  Patient presents with   Annual Exam    Getting flu shot at work on Friday     History of Present Illness:  [name] is a 55 y.o. [phone] who LMP was No LMP recorded. Patient has had an ablation., presents today for her annual examination.     Nutrition She does get adequate calcium and Vitamin D in her diet. Diet: not too bad, has lost some weight Exercise: walking, steps at work  Safety The patient wears seatbelts: yes.     The patient feels safe at home and in their relationships: yes.   Menstrual:  Symptoms of menopause: none   GYN She is single partner, contraception - post menopausal status.    Cervical Cancer Screening (21-65):   Last Pap:   December 2021 Results were: no abnormalities /neg HPV DNA   Breast Cancer Screening (Age 73-74):  There is no FH of breast cancer. There is no FH of ovarian cancer. BRCA screening Not Indicated.  Last Mammogram: 07/2019 The patient does want a mammogram this year.    Colon Cancer Screening:  Age 44-75 yo - benefits outweigh the risk. Adults 54-85 yo who have never been screened benefit.  Benefits: 134000 people in 2016 will be diagnosed and 49,000 will die - early detection helps [place]: Complications 2/2 to colonoscopy High Risk (Colonoscopy): genetic disorder (Lynch syndrome or familial adenomatous polyposis), personal hx of IBD, previous adenomatous polyp, or previous colorectal cancer, FamHx start 10 years before the age at diagnosis, increased in males and black race  Options:  FIT - looks for hemoglobin (blood in the stool) - specific and fairly sensitive - must be done annually Cologuard - looks for DNA and blood - more sensitive - therefore can have more false positives, every 3 years Colonoscopy - every 10 years if normal - sedation, bowl prep, must have someone drive you  Shared decision making and the patient had decided to do Colonoscopy due  2023.   Social History   Tobacco Use  Smoking Status Never  Smokeless Tobacco Never    Lung Cancer Screening (Ages 42-70): not applicable   Weight Wt Readings from Last 3 Encounters:  07/19/21 224 lb 4 oz (101.7 kg)  03/08/21 238 lb (108 kg)  12/06/20 242 lb 12 oz (110.1 kg)   Patient has very high BMI  BMI Readings from Last 1 Encounters:  07/19/21 39.10 kg/m     Chronic disease screening Blood pressure monitoring:  BP Readings from Last 3 Encounters:  07/19/21 (!) 142/82  03/08/21 138/78  12/06/20 138/80    Lipid Monitoring: Indication for screening: age >26, obesity, diabetes, family hx, CV risk factors.  Lipid screening: Yes  Lab Results  Component Value Date   CHOL 154 08/03/2020   HDL 53.30 08/03/2020   LDLCALC 81 08/03/2020   LDLDIRECT 163.0 01/11/2017   TRIG 98.0 08/03/2020   CHOLHDL 3 08/03/2020     Diabetes Screening: age >48, overweight, family hx, PCOS, hx of gestational diabetes, at risk ethnicity Diabetes Screening screening: Yes  Lab Results  Component Value Date   HGBA1C 7.0 (A) 03/08/2021     Past Medical History:  Diagnosis Date   AMA (advanced maternal age) multigravida 35+    Diabetes mellitus without complication ([place])    Ectopic pregnancy    Hyperlipidemia    Menometrorrhagia 01/2004, 12/2011   Nephrolithiasis    OBESITY    Penicillin allergy 11/04/2002  Vitamin D deficiency    Vulvovaginal candidiasis 07/2006    Past Surgical History:  Procedure Laterality Date   ABLATION  2013   CYSTOSCOPY WITH RETROGRADE PYELOGRAM, URETEROSCOPY AND STENT PLACEMENT Right 01/16/2013   Procedure: CYSTOSCOPY WITH RETROGRADE PYELOGRAM, URETEROSCOPY AND STENT PLACEMENT;  Surgeon: Dutch Gray, MD;  Location: [facility];  Service: Urology;  Laterality: Right;   HOLMIUM LASER APPLICATION Right 4/97/0263   Procedure: HOLMIUM LASER APPLICATION;  Surgeon: Dutch Gray, MD;  Location: [facility];  Service: Urology;  Laterality: Right;   TUBAL LIGATION  2004    TUBAL LIGATION  2004    Prior to Admission medications   Medication Sig Start Date End Date Taking? Authorizing Provider  Accu-Chek FastClix Lancets MISC 1 each by Does not apply route in the morning, at noon, and at bedtime. 08/03/20   Lesleigh Noe, MD  ACCU-CHEK GUIDE test strip USE UP TO 4 TIMES A DAY AS DIRECTED 10/01/19   Lucille Passy, MD  blood glucose meter kit and supplies Dispense based on patient and insurance preference. Use up to four times daily as directed. (FOR ICD-10 E10.9, E11.9). 09/11/19   Cirigliano, Garvin Fila, DO  cycloSPORINE (RESTASIS) 0.05 % ophthalmic emulsion Restasis 0.05 % eye drops in a dropperette    [provider]  diclofenac (VOLTAREN) 75 MG EC tablet Take 75 mg by mouth 2 (two) times daily as needed.    [provider]  losartan (COZAAR) 50 MG tablet TAKE 1 TABLET BY MOUTH EVERY DAY 05/19/21   Lesleigh Noe, MD  metFORMIN (GLUCOPHAGE) 1000 MG tablet Take 1 tablet (1,000 mg total) by mouth every evening. 08/03/20   Lesleigh Noe, MD  Multiple Vitamin (MULTIVITAMIN WITH MINERALS) TABS Take 1 tablet by mouth daily.    [provider]  rosuvastatin (CRESTOR) 5 MG tablet Take 1 tablet (5 mg total) by mouth daily. 10/12/20   Lesleigh Noe, MD  Semaglutide, 2 MG/DOSE, (OZEMPIC, 2 MG/DOSE,) 8 MG/3ML SOPN Inject 2 mg into the skin once a week. 03/08/21   Lesleigh Noe, MD  Vitamin D, Ergocalciferol, (DRISDOL) 1.25 MG (50000 UNIT) CAPS capsule Take 1 capsule (50,000 Units total) by mouth every 7 (seven) days. 11/13/19   Lucille Passy, MD    Allergies  Allergen Reactions   Penicillins Rash and Hives    Gynecologic History: No LMP recorded. Patient has had an ablation.  Obstetric History: [id number]  Social History   Socioeconomic History   Marital status: Married    Spouse name: April Cooper   Number of children: 3   Years of education: College   Highest education level: Not on file  Occupational History   Not on file  Tobacco Use    Smoking status: Never   Smokeless tobacco: Never  Vaping Use   Vaping Use: Never used  Substance and Sexual Activity   Alcohol use: Yes    Comment: twice monthly   Drug use: No   Sexual activity: Not Currently    Birth control/protection: Surgical  Other Topics Concern   Not on file  Social History Narrative   08/03/20   From: the area   Living: with husband, April Cooper (2012) and 3 kids at home   Work: VP of property management division      Family: Theresia Lo (2009), [place], [place] and then step son Elta [place]      Enjoys: family time      Exercise: walking on the treadmill (twice a week) and stairs at work  Diet: diabetic diet      Safety   Seat belts: Yes    Guns: Yes  and secure   Safe in relationships: Yes    Social Determinants of Health   Financial Resource Strain: Not on file  Food Insecurity: Not on file  Transportation Needs: Not on file  Physical Activity: Not on file  Stress: Not on file  Social Connections: Not on file  Intimate Partner Violence: Not on file    Family History  Problem Relation Age of Onset   Hypertension Mother    Hypertension Father    Diabetes Sister    Diabetes Maternal Aunt    Hypertension Maternal Grandmother    Breast cancer Paternal Aunt 69   Breast cancer Paternal Grandmother    Colon cancer Neg Hx     Review of Systems  Constitutional:  Negative for chills and fever.  HENT:  Negative for congestion and sore throat.   Eyes:  Negative for blurred vision and double vision.  Respiratory:  Negative for shortness of breath.   Cardiovascular:  Negative for chest pain.  Gastrointestinal:  Negative for heartburn, nausea and vomiting.  Genitourinary: Negative.   Musculoskeletal: Negative.  Negative for myalgias.  Skin:  Negative for rash.  Neurological:  Negative for dizziness and headaches.  Endo/Heme/Allergies:  Does not bruise/bleed easily.  Psychiatric/Behavioral:  Negative for depression. The patient is not nervous/anxious.      Physical Exam BP (!) 142/82   Pulse 63   Temp (!) 96 F (35.6 C) (Temporal)   Ht 5' 3.5" (1.613 m)   Wt 224 lb 4 oz (101.7 kg)   SpO2 100%   BMI 39.10 kg/m    BP Readings from Last 3 Encounters:  07/19/21 (!) 142/82  03/08/21 138/78  12/06/20 138/80      Physical Exam Constitutional:      General: She is not in acute distress.    Appearance: She is well-developed. She is not diaphoretic.  HENT:     Head: Normocephalic and atraumatic.     Right Ear: External ear normal.     Left Ear: External ear normal.     Nose: Nose normal.  Eyes:     General: No scleral icterus.    Extraocular Movements: Extraocular movements intact.     Conjunctiva/sclera: Conjunctivae normal.  Cardiovascular:     Rate and Rhythm: Normal rate and regular rhythm.     Heart sounds: No murmur heard. Pulmonary:     Effort: Pulmonary effort is normal. No respiratory distress.     Breath sounds: Normal breath sounds. No wheezing.  Abdominal:     General: Bowel sounds are normal. There is no distension.     Palpations: Abdomen is soft. There is no mass.     Tenderness: There is no abdominal tenderness. There is no guarding or rebound.  Musculoskeletal:        General: Normal range of motion.     Cervical back: Neck supple.  Lymphadenopathy:     Cervical: No cervical adenopathy.  Skin:    General: Skin is warm and dry.     Capillary Refill: Capillary refill takes less than 2 seconds.  Neurological:     Mental Status: She is alert and oriented to person, place, and time.     Deep Tendon Reflexes: Reflexes normal.  Psychiatric:        Mood and Affect: Mood normal.        Behavior: Behavior normal.    Results:  PHQ-9:  Depression screen [facility] 2/9 08/03/2020 10/28/2019 09/30/2018  Decreased Interest 0 0 0  Down, Depressed, Hopeless 0 0 0  PHQ - 2 Score 0 0 0       Assessment: 55 y.o. [id number] female here for routine annual physical examination.  Plan: Problem List Items Addressed This  Visit       Cardiovascular and Mediastinum   Essential hypertension    Slightly elevated. Cont losartan 50 mg      Relevant Orders   Comprehensive metabolic panel     Endocrine   Controlled type 2 diabetes mellitus without complication, without long-term current use of insulin (HCC)   Relevant Medications   metFORMIN (GLUCOPHAGE) 1000 MG tablet   Other Relevant Orders   Hemoglobin A1c   Microalbumin / creatinine urine ratio   Hyperlipidemia associated with type 2 diabetes mellitus ([place])   Relevant Medications   metFORMIN (GLUCOPHAGE) 1000 MG tablet   Other Relevant Orders   Lipid panel     Other   Vitamin D deficiency   Relevant Orders   Vitamin D, 25-hydroxy   Other Visit Diagnoses     Annual physical exam    -  Primary       Screening: -- Blood pressure screen elevated: continued to monitor. -- cholesterol screening: will obtain -- Weight screening: overweight: continue to monitor -- Diabetes Screening: will obtain -- Nutrition: Encouraged healthy diet  The 10-year ASCVD risk score (Arnett DK, et al., 2019) is: 13.8%   Values used to calculate the score:     Age: 60 years     Sex: Female     Is Non-Hispanic African American: Yes     Diabetic: Yes     Tobacco smoker: No     Systolic Blood Pressure: 481 mmHg     Is BP treated: Yes     HDL Cholesterol: 53.3 mg/dL     Total Cholesterol: 154 mg/dL  -- Statin therapy for Age 28-75 with CVD risk >7.5%  Psych -- Depression screening (PHQ-9):    Safety -- tobacco screening: not using -- alcohol screening:  low-risk usage. -- no evidence of domestic violence or intimate partner violence.   Cancer Screening -- pap smear not collected per ASCCP guidelines -- family history of breast cancer screening: done. not at high risk. -- Mammogram - requested -- Colon cancer (age 69+)--  up to date  Immunizations Immunization History  Administered Date(s) Administered   Influenza Inj Mdck Quad Pf 07/18/2019    Influenza,inj,Quad PF,6+ Mos 06/26/2014, 07/23/2020   Influenza-Unspecified 07/09/2013, 07/10/2015, 08/06/2018   Moderna Sars-Covid-2 Vaccination 12/15/2019, 01/16/2020, 09/28/2020   Pneumococcal Polysaccharide-23 11/27/2017   Td 10/20/2009   Tdap 08/03/2020    -- flu vaccine not up to date - pt will get with work -- TDAP q10 years up to date -- Shingles (age >51) - given today -- PPSV-23 (19-64 with chronic disease or smoking) up to date  -- Covid-19 Vaccine up to date   Encouraged healthy diet and exercise. Encouraged regular vision and dental care.    Lesleigh Noe, MD

## 2021-07-19 NOTE — Addendum Note (Signed)
Addended by: Loreen Freud on: 07/19/2021 04:25 PM   Modules accepted: Orders

## 2021-07-20 LAB — COMPREHENSIVE METABOLIC PANEL
ALT: 18 U/L (ref 0–35)
AST: 15 U/L (ref 0–37)
Albumin: 4.2 g/dL (ref 3.5–5.2)
Alkaline Phosphatase: 56 U/L (ref 39–117)
BUN: 10 mg/dL (ref 6–23)
CO2: 28 mEq/L (ref 19–32)
Calcium: 9.4 mg/dL (ref 8.4–10.5)
Chloride: 103 mEq/L (ref 96–112)
Creatinine, Ser: 0.77 mg/dL (ref 0.40–1.20)
GFR: 85.9 mL/min (ref >60.00)
Glucose, Bld: 91 mg/dL (ref 70–99)
Potassium: 4.2 mEq/L (ref 3.5–5.1)
Sodium: 138 mEq/L (ref 135–145)
Total Bilirubin: 0.4 mg/dL (ref 0.2–1.2)
Total Protein: 7 g/dL (ref 6.0–8.3)

## 2021-07-20 LAB — MICROALBUMIN / CREATININE URINE RATIO
Creatinine,U: 151.9 mg/dL
Microalb Creat Ratio: 0.5 mg/g (ref 0.0–30.0)
Microalb, Ur: <0.7 mg/dL (ref 0.0–1.9)

## 2021-07-20 LAB — LIPID PANEL
Cholesterol: 138 mg/dL (ref 0–200)
HDL: 48.9 mg/dL (ref >39.00)
LDL Cholesterol: 73 mg/dL (ref 0–99)
NonHDL: 89.06
Total CHOL/HDL Ratio: 3
Triglycerides: 79 mg/dL (ref 0.0–149.0)
VLDL: 15.8 mg/dL (ref 0.0–40.0)

## 2021-07-20 LAB — HEMOGLOBIN A1C: Hgb A1c MFr Bld: 6.9 % — ABNORMAL HIGH (ref 4.6–6.5)

## 2021-07-20 LAB — VITAMIN D 25 HYDROXY (VIT D DEFICIENCY, FRACTURES): VITD: 20.54 ng/mL — ABNORMAL LOW (ref 30.00–100.00)

## 2021-07-25 ENCOUNTER — Encounter: Payer: Self-pay | Admitting: Family Medicine

## 2021-07-25 ENCOUNTER — Telehealth: Payer: Self-pay | Admitting: Family Medicine

## 2021-07-25 DIAGNOSIS — E119 Type 2 diabetes mellitus without complications: Secondary | ICD-10-CM

## 2021-07-25 NOTE — Telephone Encounter (Signed)
Semaglutide, 2 MG/DOSE, (OZEMPIC, 2 MG/DOSE,) 8 MG/3ML SOPN Is on back order from the manufacturer. Patient asking what can she do?

## 2021-07-26 NOTE — Telephone Encounter (Signed)
Responded to pt's mychart message.

## 2021-07-28 MED ORDER — SEMAGLUTIDE (1 MG/DOSE) 4 MG/3ML ~~LOC~~ SOPN: 2.0000 mg | PEN_INJECTOR | SUBCUTANEOUS | 1 refills | Status: DC

## 2021-07-28 NOTE — Addendum Note (Signed)
Addended by: Lesleigh Noe on: 07/28/2021 06:10 PM   Modules accepted: Orders

## 2021-08-05 ENCOUNTER — Encounter: Payer: Self-pay | Admitting: Family Medicine

## 2021-08-20 ENCOUNTER — Other Ambulatory Visit: Payer: Self-pay | Admitting: Family Medicine

## 2021-09-08 NOTE — Telephone Encounter (Signed)
The patient is unable to get Ozempic at all regardless of the Mg. She is needing something to take she is completely out of her diabetic medication.

## 2021-09-08 NOTE — Telephone Encounter (Signed)
Please call patient:  Is she still taking her metformin 1000 mg twice daily?  Does she check her blood sugars? If so what are they running?  How long has she been out of Ozempic?

## 2021-09-09 MED ORDER — TIRZEPATIDE 2.5 MG/0.5ML ~~LOC~~ SOAJ: 2.5000 mg | SUBCUTANEOUS | 2 refills | Status: DC

## 2021-09-09 NOTE — Telephone Encounter (Signed)
Patient is only taking metformin once a day in evening 1000mg   She is not checking her blood sugars  She has been out for a week of the Ozempic.  Patient did ask if Darcel Bayley would be option in place of Ozempic

## 2021-09-09 NOTE — Telephone Encounter (Addendum)
Yes, we can try Mounjaro, but the trouble we run into with this medication is insurance coverage.  If she would like to proceed then I will send in a prescription for her to inject 2.5 mg once weekly for diabetes.  Let me know.  Also have her notify us if she has trouble obtaining the prescription.

## 2021-09-09 NOTE — Addendum Note (Signed)
Addended by: Pleas Koch on: 09/09/2021 05:34 PM   Modules accepted: Orders

## 2021-09-09 NOTE — Telephone Encounter (Signed)
Noted, prescription for mounjaro sent to patient's pharmacy

## 2021-09-09 NOTE — Telephone Encounter (Signed)
Called patient she would like to have script called. She has discount card but will call if any issues getting filled. Reviewed medication directions with patient.

## 2021-09-22 ENCOUNTER — Other Ambulatory Visit: Payer: Self-pay

## 2021-09-22 ENCOUNTER — Ambulatory Visit (INDEPENDENT_AMBULATORY_CARE_PROVIDER_SITE_OTHER): Payer: 59

## 2021-09-22 DIAGNOSIS — Z23 Encounter for immunization: Secondary | ICD-10-CM

## 2021-09-22 NOTE — Progress Notes (Signed)
Per orders of Alma Friendly NP, injection of Shingles was given by Ophelia Shoulder, CMA. Patient tolerated injection well.

## 2021-11-16 ENCOUNTER — Other Ambulatory Visit: Payer: Self-pay | Admitting: Family Medicine

## 2021-11-16 DIAGNOSIS — I10 Essential (primary) hypertension: Secondary | ICD-10-CM

## 2021-12-03 ENCOUNTER — Other Ambulatory Visit: Payer: Self-pay | Admitting: Primary Care

## 2021-12-03 DIAGNOSIS — E119 Type 2 diabetes mellitus without complications: Secondary | ICD-10-CM

## 2021-12-15 ENCOUNTER — Other Ambulatory Visit: Payer: Self-pay | Admitting: Primary Care

## 2021-12-15 DIAGNOSIS — E119 Type 2 diabetes mellitus without complications: Secondary | ICD-10-CM

## 2021-12-16 ENCOUNTER — Telehealth: Payer: Self-pay | Admitting: Family Medicine

## 2021-12-16 DIAGNOSIS — E119 Type 2 diabetes mellitus without complications: Secondary | ICD-10-CM

## 2021-12-16 NOTE — Telephone Encounter (Signed)
Pt wants to know why RX is not being refill would like a call back [phone] ? ? ?Encourage patient to contact the pharmacy for refills or they can request refills through [facility] ? ?LAST APPOINTMENT DATE:  Please schedule appointment if longer than 1 year ? ?NEXT APPOINTMENT DATE: ? ?MEDICATION:tirzepatide (MOUNJARO) 2.5 MG/0.5ML Pen ? ?Is the patient out of medication?  ? ?PHARMACY:[id number] [place], [place] [address]  ? ?Let patient know to contact pharmacy at the end of the day to make sure medication is ready. ? ?Please notify patient to allow 48-72 hours to process ? ?CLINICAL FILLS OUT ALL BELOW:  ? ?LAST REFILL: ? ?QTY: ? ?REFILL DATE: ? ? ? ?OTHER COMMENTS:  ? ? ?Okay for refill? ? ?Please advise ? ? ? ? ?

## 2021-12-16 NOTE — Telephone Encounter (Signed)
Patient has not been seen since October, needs follow-up visit if she wants to continue [facility]. ?

## 2021-12-28 NOTE — Telephone Encounter (Signed)
Pt stated she out of RX requesting refill ?Appointment has been schedule ? ? ?Encourage patient to contact the pharmacy for refills or they can request refills through [facility] ? ?LAST APPOINTMENT DATE:  Please schedule appointment if longer than 1 year ? ?NEXT APPOINTMENT DATE: ? ?MEDICATION:metFORMIN (GLUCOPHAGE) 1000 MG tablet ? ?tirzepatide [facility]) 2.5 MG/0.5ML Pen ? ?Is the patient out of medication?  ? ?PHARMACY:[id number] [place], [place] - [address]  ? ?Let patient know to contact pharmacy at the end of the day to make sure medication is ready. ? ?Please notify patient to allow 48-72 hours to process ? ?CLINICAL FILLS OUT ALL BELOW:  ? ?LAST REFILL: ? ?QTY: ? ?REFILL DATE: ? ? ? ?OTHER COMMENTS:  ? ? ?Okay for refill? ? ?Please advise ? ? ? ? ?

## 2021-12-29 ENCOUNTER — Other Ambulatory Visit: Payer: Self-pay | Admitting: Family Medicine

## 2021-12-29 DIAGNOSIS — E119 Type 2 diabetes mellitus without complications: Secondary | ICD-10-CM

## 2021-12-29 MED ORDER — TIRZEPATIDE 2.5 MG/0.5ML ~~LOC~~ SOAJ: 2.5000 mg | SUBCUTANEOUS | 0 refills | Status: DC

## 2021-12-29 NOTE — Telephone Encounter (Signed)
Filled mounjaro since pt is out of medication ?

## 2021-12-29 NOTE — Telephone Encounter (Signed)
Spoke to pt and let her know that she should have refills at [facility] for her metformin. Pt is going out of town today and won't return until Sunday. Pt has appt with Dr. Einar Pheasant on Monday. Will refill mounjaro At appt.  ?

## 2022-01-02 ENCOUNTER — Other Ambulatory Visit: Payer: Self-pay

## 2022-01-02 ENCOUNTER — Ambulatory Visit: Payer: 59 | Admitting: Family Medicine

## 2022-01-02 DIAGNOSIS — I10 Essential (primary) hypertension: Secondary | ICD-10-CM

## 2022-01-02 DIAGNOSIS — E119 Type 2 diabetes mellitus without complications: Secondary | ICD-10-CM

## 2022-01-02 LAB — POCT GLYCOSYLATED HEMOGLOBIN (HGB A1C): Hemoglobin A1C: 7 % — AB (ref 4.0–5.6)

## 2022-01-02 MED ORDER — TIRZEPATIDE 5 MG/0.5ML ~~LOC~~ SOAJ: 5.0000 mg | SUBCUTANEOUS | 0 refills | Status: DC

## 2022-01-02 MED ORDER — ACCU-CHEK GUIDE VI STRP: ORAL_STRIP | 3 refills | Status: DC

## 2022-01-02 MED ORDER — ACCU-CHEK FASTCLIX LANCETS MISC: 1.0000 | Freq: Three times a day (TID) | 3 refills | Status: AC

## 2022-01-02 MED ORDER — LOSARTAN POTASSIUM 50 MG PO TABS: 50.0000 mg | ORAL_TABLET | Freq: Every day | ORAL | 3 refills | Status: DC

## 2022-01-02 NOTE — Assessment & Plan Note (Signed)
Lab Results  ?Component Value Date  ? HGBA1C 7.0 (A) 01/02/2022  ? ?Stable, but slightly worse in setting of access to medication. Increase mounjaro to 5 mg weekly. Cont metformin 1000 mg daily. Update via mychart in 4 weeks to increase mounjaro. Return 3 months.  ?

## 2022-01-02 NOTE — Patient Instructions (Signed)
Increase Mounjaro ? ?Update via Mychart in 3-4 weeks for dose increase if tolerating ? ?Return in 3 months for weight and diabetes check ?

## 2022-01-02 NOTE — Progress Notes (Signed)
? ?Subjective:  ? ?  ?April Cooper is a 56 y.o. female presenting for Follow-up (DM /Requesting eye exam ) ?  ? ? ?HPI ? ?#Diabetes ?Currently taking metformin and mounjaro 2.5 mg (ran out of this, was recently refilled) ?Using medications without difficulties: Yes ?Hypoglycemic episodes: does not check ?Hyperglycemic episodes: does not check ?Feet problems:No  ?Blood Sugars averaging: does not check ?Last HgbA1c:  ?Lab Results  ?Component Value Date  ? HGBA1C 7.0 (A) 01/02/2022  ? ? ?Diabetes Health Maintenance Due:  ?  ?Diabetes Health Maintenance Due  ?Topic Date Due  ? OPHTHALMOLOGY EXAM  09/30/2021  ? HEMOGLOBIN A1C  07/05/2022  ? FOOT EXAM  01/03/2023  ? ?Diet: better overall, BP is down - left her job, tries to watch what she eats - low carb in moderation ?Exercise: walking 2 miles daily ? ? ?Review of Systems ? ? ?Social History  ? ?Tobacco Use  ?Smoking Status Never  ?Smokeless Tobacco Never  ? ? ? ?   ?Objective:  ?  ?BP Readings from Last 3 Encounters:  ?01/02/22 120/70  ?07/19/21 (!) 142/82  ?03/08/21 138/78  ? ?Wt Readings from Last 3 Encounters:  ?01/02/22 231 lb 7 oz (105 kg)  ?07/19/21 224 lb 4 oz (101.7 kg)  ?03/08/21 238 lb (108 kg)  ? ? ?BP 120/70   Pulse 69   Temp 98 ?F (36.7 ?C) (Oral)   Ht 5' 3.5" (1.613 m)   Wt 231 lb 7 oz (105 kg)   SpO2 96%   BMI 40.35 kg/m?  ? ? ?Physical Exam ?Constitutional:   ?   General: She is not in acute distress. ?   Appearance: She is well-developed. She is not diaphoretic.  ?HENT:  ?   Right Ear: External ear normal.  ?   Left Ear: External ear normal.  ?Eyes:  ?   Conjunctiva/sclera: Conjunctivae normal.  ?Cardiovascular:  ?   Rate and Rhythm: Normal rate and regular rhythm.  ?   Heart sounds: No murmur heard. ?Pulmonary:  ?   Effort: Pulmonary effort is normal. No respiratory distress.  ?   Breath sounds: Normal breath sounds. No wheezing.  ?Musculoskeletal:  ?   Cervical back: Neck supple.  ?Skin: ?   General: Skin is warm and dry.  ?   Capillary  Refill: Capillary refill takes less than 2 seconds.  ?Neurological:  ?   Mental Status: She is alert. Mental status is at baseline.  ?Psychiatric:     ?   Mood and Affect: Mood normal.     ?   Behavior: Behavior normal.  ? ?Diabetic Foot Exam - Simple   ?Simple Foot Form ?Diabetic Foot exam was performed with the following findings: Yes 01/02/2022 11:29 AM  ?Visual Inspection ?No deformities, no ulcerations, no other skin breakdown bilaterally: Yes ?Sensation Testing ?Intact to touch and monofilament testing bilaterally: Yes ?Pulse Check ?Posterior Tibialis and Dorsalis pulse intact bilaterally: Yes ?Comments ?  ? ? ? ? ? ?   ?Assessment & Plan:  ? ?Problem List Items Addressed This Visit   ? ?  ? Cardiovascular and Mediastinum  ? Essential hypertension  ?  Controlled. Cont losartan 50 mg ?  ?  ? Relevant Medications  ? losartan (COZAAR) 50 MG tablet  ?  ? Endocrine  ? Controlled type 2 diabetes mellitus without complication, without long-term current use of insulin ([place])  ?  Lab Results  ?Component Value Date  ? HGBA1C 7.0 (A) 01/02/2022  ?Stable, but  slightly worse in setting of access to medication. Increase mounjaro to 5 mg weekly. Cont metformin 1000 mg daily. Update via mychart in 4 weeks to increase mounjaro. Return 3 months.  ?  ?  ? Relevant Medications  ? losartan (COZAAR) 50 MG tablet  ? Accu-Chek FastClix Lancets MISC  ? glucose blood (ACCU-CHEK GUIDE) test strip  ? tirzepatide (MOUNJARO) 5 MG/0.5ML Pen  ? Other Relevant Orders  ? POCT glycosylated hemoglobin (Hb A1C) (Completed)  ? ? ? ?Return in about 3 months (around 04/04/2022) for DM and weight. ? ?Lesleigh Noe, MD ? ?This visit occurred during the SARS-CoV-2 public health emergency.  Safety protocols were in place, including screening questions prior to the visit, additional usage of staff PPE, and extensive cleaning of exam room while observing appropriate contact time as indicated for disinfecting solutions.  ? ?

## 2022-01-02 NOTE — Assessment & Plan Note (Signed)
Controlled. Cont losartan 50 mg ?

## 2022-01-05 NOTE — Telephone Encounter (Signed)
Looks like patient has f/u scheduled for 04/05/22; does she need to be seen sooner? ?

## 2022-01-06 NOTE — Telephone Encounter (Signed)
Please have patient schedule appointment for weight check, DM and medication refill ?

## 2022-01-09 NOTE — Telephone Encounter (Signed)
Dose was increased to 5 mg at that visit. No need for 2.5 mg refill ?

## 2022-02-01 ENCOUNTER — Encounter: Payer: Self-pay | Admitting: Family Medicine

## 2022-02-01 DIAGNOSIS — E119 Type 2 diabetes mellitus without complications: Secondary | ICD-10-CM

## 2022-02-02 MED ORDER — OZEMPIC (1 MG/DOSE) 4 MG/3ML ~~LOC~~ SOPN: 1.0000 mg | PEN_INJECTOR | SUBCUTANEOUS | 0 refills | Status: DC

## 2022-02-02 NOTE — Addendum Note (Signed)
Addended by: Lesleigh Noe on: 02/02/2022 02:38 PM ? ? Modules accepted: Orders ? ?

## 2022-03-07 ENCOUNTER — Ambulatory Visit: Payer: 59 | Admitting: Family Medicine

## 2022-03-07 ENCOUNTER — Emergency Department (HOSPITAL_COMMUNITY)
Admission: EM | Admit: 2022-03-07 | Discharge: 2022-03-07 | Disposition: A | Discharge status: Home / Self Care | Payer: 59 | Source: Home / Self Care | Attending: Emergency Medicine | Admitting: Emergency Medicine

## 2022-03-07 ENCOUNTER — Other Ambulatory Visit: Payer: Self-pay

## 2022-03-07 ENCOUNTER — Encounter (HOSPITAL_COMMUNITY): Payer: Self-pay

## 2022-03-07 VITALS — BP 160/80 | HR 66 | Temp 97.4°F | Ht 63.5 in | Wt 235.6 lb

## 2022-03-07 DIAGNOSIS — L03032 Cellulitis of left toe: Secondary | ICD-10-CM | POA: Insufficient documentation

## 2022-03-07 DIAGNOSIS — L03119 Cellulitis of unspecified part of limb: Secondary | ICD-10-CM | POA: Diagnosis not present

## 2022-03-07 DIAGNOSIS — L03116 Cellulitis of left lower limb: Secondary | ICD-10-CM | POA: Diagnosis not present

## 2022-03-07 DIAGNOSIS — I10 Essential (primary) hypertension: Secondary | ICD-10-CM | POA: Diagnosis not present

## 2022-03-07 DIAGNOSIS — E119 Type 2 diabetes mellitus without complications: Secondary | ICD-10-CM

## 2022-03-07 DIAGNOSIS — L03818 Cellulitis of other sites: Secondary | ICD-10-CM

## 2022-03-07 DIAGNOSIS — L02612 Cutaneous abscess of left foot: Secondary | ICD-10-CM | POA: Diagnosis not present

## 2022-03-07 MED ORDER — CEPHALEXIN 500 MG PO CAPS: 500.0000 mg | ORAL_CAPSULE | Freq: Two times a day (BID) | ORAL | 0 refills | Status: DC

## 2022-03-07 MED ORDER — SULFAMETHOXAZOLE-TRIMETHOPRIM 800-160 MG PO TABS: 1.0000 | ORAL_TABLET | Freq: Two times a day (BID) | ORAL | 0 refills | Status: AC

## 2022-03-07 NOTE — ED Provider Notes (Signed)
[facility] DEPT Provider Note   CSN: [id number] Arrival date & time: 03/07/22  2043     History  Chief Complaint  Patient presents with   Insect Bite    April Cooper is a 56 y.o. female.  Patient presents with an erythematous rash on the great toe of her left foot.  Patient states that she noticed a itchy feeling starting Saturday.  Today she was seen by family medicine and was diagnosed with cellulitis of the toe.  She was prescribed Bactrim and Keflex.  The patient has taken 1 dosage of each.  She noticed a few blisters in the area tonight and came to the emergency department for reevaluation.  No systemic symptoms.  No fever, nausea, vomiting, abdominal pain.  Rash is isolated to the great toe of left foot  HPI     Home Medications Prior to Admission medications   Medication Sig Start Date End Date Taking? Authorizing Provider  Accu-Chek FastClix Lancets MISC 1 each by Does not apply route in the morning, at noon, and at bedtime. 01/02/22   Lesleigh Noe, MD  blood glucose meter kit and supplies Dispense based on patient and insurance preference. Use up to four times daily as directed. (FOR ICD-10 E10.9, E11.9). 09/11/19   Cirigliano, Garvin Fila, DO  cephALEXin (KEFLEX) 500 MG capsule Take 1 capsule (500 mg total) by mouth 2 (two) times daily for 10 days. 03/07/22 03/17/22  Lesleigh Noe, MD  cycloSPORINE (RESTASIS) 0.05 % ophthalmic emulsion Restasis 0.05 % eye drops in a dropperette    [provider]  diclofenac (VOLTAREN) 75 MG EC tablet Take 75 mg by mouth 2 (two) times daily as needed.    [provider]  glucose blood (ACCU-CHEK GUIDE) test strip USE UP TO 4 TIMES A DAY AS DIRECTED 01/02/22   Lesleigh Noe, MD  losartan (COZAAR) 50 MG tablet Take 1 tablet (50 mg total) by mouth daily. 01/02/22   Lesleigh Noe, MD  metFORMIN (GLUCOPHAGE) 1000 MG tablet Take 1 tablet (1,000 mg total) by mouth every evening. 07/19/21   Lesleigh Noe, MD  Multiple Vitamin (MULTIVITAMIN WITH MINERALS) TABS Take 1 tablet by mouth daily.    [provider]  rosuvastatin (CRESTOR) 5 MG tablet TAKE 1 TABLET (5 MG TOTAL) BY MOUTH DAILY. 08/23/21   Lesleigh Noe, MD  Semaglutide, 1 MG/DOSE, (OZEMPIC, 1 MG/DOSE,) 4 MG/3ML SOPN Inject 1 mg into the skin once a week. Patient not taking: Reported on 03/07/2022 02/02/22   Lesleigh Noe, MD  sulfamethoxazole-trimethoprim (BACTRIM DS) 800-160 MG tablet Take 1 tablet by mouth 2 (two) times daily for 10 days. 03/07/22 03/17/22  Lesleigh Noe, MD      Allergies    Penicillins    Review of Systems   Review of Systems  Constitutional:  Negative for fever.  Respiratory:  Negative for shortness of breath.   Gastrointestinal:  Negative for abdominal pain, nausea and vomiting.  Skin:  Positive for rash.   Physical Exam Updated Vital Signs BP (!) 151/110   Pulse 74   Temp 98.3 F (36.8 C) (Oral)   Resp 20   SpO2 99%  Physical Exam Vitals and nursing note reviewed.  Constitutional:      General: She is not in acute distress.    Appearance: She is well-developed.  HENT:     Head: Normocephalic and atraumatic.  Eyes:     Conjunctiva/sclera: Conjunctivae normal.  Cardiovascular:  Rate and Rhythm: Normal rate.  Pulmonary:     Effort: Pulmonary effort is normal.  Musculoskeletal:     Cervical back: Neck supple.  Skin:    General: Skin is warm and dry.     Capillary Refill: Capillary refill takes less than 2 seconds.     Findings: Rash present.     Comments: Small area of erythema around great toe.  Approximately 3 small blisters on the medial aspect of the dorsal side of the great toe  Neurological:     Mental Status: She is alert.  Psychiatric:        Mood and Affect: Mood normal.    ED Results / Procedures / Treatments   Labs (all labs ordered are listed, but only abnormal results are displayed) Labs Reviewed - No data to display  EKG None  Radiology No  results found.  Procedures Procedures    Medications Ordered in ED Medications - No data to display  ED Course/ Medical Decision Making/ A&P                           Medical Decision Making  The patient has what appears to be cellulitis of the great toe.  I agree with the assessment of the provider from earlier today.  I believe the antibiotics prescribed provide appropriate coverage.  Plan to discharge the patient home to continue prescribed antibiotics.  The patient will either take a picture or use a marker to mark the borders of the erythema.  She will monitor to make sure that it does not begin to spread.  Return precautions provided   Final Clinical Impression(s) / ED Diagnoses Final diagnoses:  Cellulitis of other specified site    Rx / DC Orders ED Discharge Orders     None         Ronny Bacon 03/07/22 2115    Carmin Muskrat, MD 03/07/22 2234

## 2022-03-07 NOTE — Assessment & Plan Note (Addendum)
From possible insect bite, no know trauma. Due to severity and diabetes will cover for possible mrsa. ER precautions discussed.

## 2022-03-07 NOTE — ED Triage Notes (Signed)
Pt believes that she was bit on her left great toe last Wednesday. Pt has some discoloration and blistering to the area. Pt reports being seen earlier today and given abts but she states that the blistering is new.

## 2022-03-07 NOTE — Progress Notes (Signed)
Subjective:     April Cooper is a 56 y.o. female presenting for Insect Bite (L foot swollen, red and warm to touch. Bite looks to be between and 1st and 2nd toe.)     HPI  #Diabetes - unable to tolerate high does of ozempic - trying to do diet  Lab Results  Component Value Date   HGBA1C 7.0 (A) 01/02/2022    #Foot pain -Suspect she was bitten by something -Did not see what may have bit her -Pain started, then redness, then lesion in between the toes. -Difficult pain, but she has not taken ibuprofen or Tylenol -No fevers or chills - did not injure or step on anything  #HTN - in significant pain today - no cp, ha, sob   Review of Systems   Social History   Tobacco Use  Smoking Status Never  Smokeless Tobacco Never        Objective:    BP Readings from Last 3 Encounters:  03/07/22 (!) 160/80  01/02/22 120/70  07/19/21 (!) 142/82   Wt Readings from Last 3 Encounters:  03/07/22 235 lb 9 oz (106.9 kg)  01/02/22 231 lb 7 oz (105 kg)  07/19/21 224 lb 4 oz (101.7 kg)    BP (!) 160/80   Pulse 66   Temp (!) 97.4 F (36.3 C) (Temporal)   Ht 5' 3.5" (1.613 m)   Wt 235 lb 9 oz (106.9 kg)   SpO2 99%   BMI 41.07 kg/m    Physical Exam Constitutional:      General: She is not in acute distress.    Appearance: She is well-developed. She is not diaphoretic.  HENT:     Right Ear: External ear normal.     Left Ear: External ear normal.     Nose: Nose normal.  Eyes:     Conjunctiva/sclera: Conjunctivae normal.  Cardiovascular:     Rate and Rhythm: Normal rate.  Pulmonary:     Effort: Pulmonary effort is normal.  Musculoskeletal:     Cervical back: Neck supple.  Skin:    General: Skin is warm and dry.     Capillary Refill: Capillary refill takes less than 2 seconds.     Comments: Left foot Inspection: erythema, swelling from toes to ankle, between the first and second digit is a white/grey/black swollen area Palpation: ttp along the 1st and 2nd  digit and warmth to tough along the entire foot  Neurological:     Mental Status: She is alert. Mental status is at baseline.  Psychiatric:        Mood and Affect: Mood normal.        Behavior: Behavior normal.          Assessment & Plan:   Problem List Items Addressed This Visit       Cardiovascular and Mediastinum   Essential hypertension    Poorly controlled. Likely 2/2 to pain today. Cont losartan 50 mg. Home monitoring and update next week.          Endocrine   Controlled type 2 diabetes mellitus without complication, without long-term current use of insulin ([place])    Lab Results  Component Value Date   HGBA1C 7.0 (A) 01/02/2022  Generally good control, but will treat cellulitis with close monitoring and follow-up. Cont metformin.         Other   Cellulitis of left lower extremity - Primary    From possible insect bite, no know trauma. Due to severity  and diabetes will cover for possible mrsa. ER precautions discussed.        Relevant Medications   sulfamethoxazole-trimethoprim (BACTRIM DS) 800-160 MG tablet   cephALEXin (KEFLEX) 500 MG capsule     Return in about 2 days (around 03/09/2022), or if symptoms worsen or fail to improve.  Lesleigh Noe, MD

## 2022-03-07 NOTE — Patient Instructions (Addendum)
Take the antibiotics  Schedule an office visit in 2 days - you can cancel if getting better  If worse - fever, chills, worsening redness, swelling, pain - go to the ER  Try ibuprofen 600-800 mg and tylenol for pain  If pain persists I can send in a stronger medication

## 2022-03-07 NOTE — Assessment & Plan Note (Signed)
Lab Results  Component Value Date   HGBA1C 7.0 (A) 01/02/2022   Generally good control, but will treat cellulitis with close monitoring and follow-up. Cont metformin.

## 2022-03-07 NOTE — Assessment & Plan Note (Signed)
Poorly controlled. Likely 2/2 to pain today. Cont losartan 50 mg. Home monitoring and update next week.

## 2022-03-07 NOTE — Discharge Instructions (Addendum)
You were seen today for cellulitis of the great toe.  As discussed, monitor the affected area for possible spread.  If redness begins to spread up the foot and towards the ankle, recommend reevaluation.  Please take antibiotics as prescribed by your family medicine provider earlier today.  Follow-up as needed.

## 2022-03-09 ENCOUNTER — Emergency Department (HOSPITAL_COMMUNITY): Payer: 59

## 2022-03-09 ENCOUNTER — Ambulatory Visit: Payer: 59 | Admitting: Family Medicine

## 2022-03-09 ENCOUNTER — Inpatient Hospital Stay (HOSPITAL_COMMUNITY)
Admission: EM | Admit: 2022-03-09 | Discharge: 2022-03-13 | DRG: 571 | Disposition: A | Discharge status: Home / Self Care | Payer: 59 | Attending: Internal Medicine | Admitting: Internal Medicine

## 2022-03-09 ENCOUNTER — Encounter (HOSPITAL_COMMUNITY): Payer: Self-pay | Admitting: Emergency Medicine

## 2022-03-09 ENCOUNTER — Encounter: Payer: Self-pay | Admitting: Family Medicine

## 2022-03-09 ENCOUNTER — Other Ambulatory Visit: Payer: Self-pay

## 2022-03-09 VITALS — BP 140/68 | HR 72 | Temp 97.1°F | Wt 236.3 lb

## 2022-03-09 DIAGNOSIS — Z833 Family history of diabetes mellitus: Secondary | ICD-10-CM

## 2022-03-09 DIAGNOSIS — Z7984 Long term (current) use of oral hypoglycemic drugs: Secondary | ICD-10-CM | POA: Diagnosis not present

## 2022-03-09 DIAGNOSIS — E1169 Type 2 diabetes mellitus with other specified complication: Secondary | ICD-10-CM | POA: Diagnosis present

## 2022-03-09 DIAGNOSIS — L03032 Cellulitis of left toe: Secondary | ICD-10-CM | POA: Diagnosis present

## 2022-03-09 DIAGNOSIS — L03116 Cellulitis of left lower limb: Secondary | ICD-10-CM

## 2022-03-09 DIAGNOSIS — E119 Type 2 diabetes mellitus without complications: Secondary | ICD-10-CM | POA: Diagnosis not present

## 2022-03-09 DIAGNOSIS — I10 Essential (primary) hypertension: Secondary | ICD-10-CM | POA: Diagnosis present

## 2022-03-09 DIAGNOSIS — Z8249 Family history of ischemic heart disease and other diseases of the circulatory system: Secondary | ICD-10-CM

## 2022-03-09 DIAGNOSIS — L02612 Cutaneous abscess of left foot: Secondary | ICD-10-CM | POA: Diagnosis present

## 2022-03-09 DIAGNOSIS — Z880 Allergy status to penicillin: Secondary | ICD-10-CM | POA: Diagnosis not present

## 2022-03-09 DIAGNOSIS — Z7985 Long-term (current) use of injectable non-insulin antidiabetic drugs: Secondary | ICD-10-CM

## 2022-03-09 DIAGNOSIS — E1140 Type 2 diabetes mellitus with diabetic neuropathy, unspecified: Secondary | ICD-10-CM | POA: Diagnosis present

## 2022-03-09 DIAGNOSIS — E785 Hyperlipidemia, unspecified: Secondary | ICD-10-CM | POA: Diagnosis present

## 2022-03-09 DIAGNOSIS — Z713 Dietary counseling and surveillance: Secondary | ICD-10-CM | POA: Diagnosis not present

## 2022-03-09 DIAGNOSIS — Z79899 Other long term (current) drug therapy: Secondary | ICD-10-CM

## 2022-03-09 DIAGNOSIS — L03119 Cellulitis of unspecified part of limb: Secondary | ICD-10-CM | POA: Diagnosis present

## 2022-03-09 DIAGNOSIS — B957 Other staphylococcus as the cause of diseases classified elsewhere: Secondary | ICD-10-CM | POA: Diagnosis present

## 2022-03-09 DIAGNOSIS — L02619 Cutaneous abscess of unspecified foot: Secondary | ICD-10-CM | POA: Diagnosis not present

## 2022-03-09 DIAGNOSIS — W57XXXA Bitten or stung by nonvenomous insect and other nonvenomous arthropods, initial encounter: Secondary | ICD-10-CM | POA: Diagnosis present

## 2022-03-09 DIAGNOSIS — E11628 Type 2 diabetes mellitus with other skin complications: Secondary | ICD-10-CM | POA: Diagnosis present

## 2022-03-09 DIAGNOSIS — L089 Local infection of the skin and subcutaneous tissue, unspecified: Principal | ICD-10-CM

## 2022-03-09 DIAGNOSIS — E1165 Type 2 diabetes mellitus with hyperglycemia: Secondary | ICD-10-CM | POA: Diagnosis present

## 2022-03-09 DIAGNOSIS — Z6841 Body Mass Index (BMI) 40.0 and over, adult: Secondary | ICD-10-CM | POA: Diagnosis not present

## 2022-03-09 HISTORY — DX: Essential (primary) hypertension: I10

## 2022-03-09 LAB — COMPREHENSIVE METABOLIC PANEL
ALT: 21 U/L (ref 0–44)
AST: 15 U/L (ref 15–41)
Albumin: 3.9 g/dL (ref 3.5–5.0)
Alkaline Phosphatase: 68 U/L (ref 38–126)
Anion gap: 7 (ref 5–15)
BUN: 12 mg/dL (ref 6–20)
CO2: 24 mmol/L (ref 22–32)
Calcium: 8.9 mg/dL (ref 8.9–10.3)
Chloride: 107 mmol/L (ref 98–111)
Creatinine, Ser: 1 mg/dL (ref 0.44–1.00)
GFR, Estimated: >60 mL/min (ref >60)
Glucose, Bld: 131 mg/dL — ABNORMAL HIGH (ref 70–99)
Potassium: 4.2 mmol/L (ref 3.5–5.1)
Sodium: 138 mmol/L (ref 135–145)
Total Bilirubin: 0.5 mg/dL (ref 0.3–1.2)
Total Protein: 7.7 g/dL (ref 6.5–8.1)

## 2022-03-09 LAB — GLUCOSE, CAPILLARY: Glucose-Capillary: 159 mg/dL — ABNORMAL HIGH (ref 70–99)

## 2022-03-09 LAB — CBC WITH DIFFERENTIAL/PLATELET
Abs Immature Granulocytes: 0.02 10*3/uL (ref 0.00–0.07)
Basophils Absolute: 0 10*3/uL (ref 0.0–0.1)
Basophils Relative: 0 %
Eosinophils Absolute: 0.5 10*3/uL (ref 0.0–0.5)
Eosinophils Relative: 6 %
HCT: 45.2 % (ref 36.0–46.0)
Hemoglobin: 14.2 g/dL (ref 12.0–15.0)
Immature Granulocytes: 0 %
Lymphocytes Relative: 28 %
Lymphs Abs: 2.1 10*3/uL (ref 0.7–4.0)
MCH: 28 pg (ref 26.0–34.0)
MCHC: 31.4 g/dL (ref 30.0–36.0)
MCV: 89.2 fL (ref 80.0–100.0)
Monocytes Absolute: 0.7 10*3/uL (ref 0.1–1.0)
Monocytes Relative: 9 %
Neutro Abs: 4.4 10*3/uL (ref 1.7–7.7)
Neutrophils Relative %: 57 %
Platelets: 313 10*3/uL (ref 150–400)
RBC: 5.07 MIL/uL (ref 3.87–5.11)
RDW: 14.1 % (ref 11.5–15.5)
WBC: 7.7 10*3/uL (ref 4.0–10.5)
nRBC: 0 % (ref 0.0–0.2)

## 2022-03-09 LAB — PROTIME-INR
INR: 1 (ref 0.8–1.2)
Prothrombin Time: 12.7 seconds (ref 11.4–15.2)

## 2022-03-09 LAB — LACTIC ACID, PLASMA
Lactic Acid, Venous: 1.3 mmol/L (ref 0.5–1.9)
Lactic Acid, Venous: 1.8 mmol/L (ref 0.5–1.9)

## 2022-03-09 LAB — C-REACTIVE PROTEIN: CRP: 0.9 mg/dL (ref <1.0)

## 2022-03-09 LAB — SEDIMENTATION RATE: Sed Rate: 16 mm/hr (ref 0–22)

## 2022-03-09 LAB — CBG MONITORING, ED: Glucose-Capillary: 99 mg/dL (ref 70–99)

## 2022-03-09 MED ORDER — INSULIN ASPART 100 UNIT/ML IJ SOLN
0.0000 [IU] | Freq: Three times a day (TID) | INTRAMUSCULAR | Status: DC
  Administered 2022-03-10: 8 [IU] via SUBCUTANEOUS
  Administered 2022-03-10: 2 [IU] via SUBCUTANEOUS
  Administered 2022-03-11: 5 [IU] via SUBCUTANEOUS
  Administered 2022-03-11 – 2022-03-12 (×3): 3 [IU] via SUBCUTANEOUS
  Administered 2022-03-12: 2 [IU] via SUBCUTANEOUS
  Administered 2022-03-12: 5 [IU] via SUBCUTANEOUS
  Administered 2022-03-13: 3 [IU] via SUBCUTANEOUS
  Administered 2022-03-13: 2 [IU] via SUBCUTANEOUS
  Filled 2022-03-09: qty 0.15

## 2022-03-09 MED ORDER — SODIUM CHLORIDE 0.9 % IV SOLN: Freq: Once | INTRAVENOUS | Status: AC

## 2022-03-09 MED ORDER — VANCOMYCIN HCL 2000 MG/400ML IV SOLN
2000.0000 mg | Freq: Once | INTRAVENOUS | Status: AC
Start: 2022-03-09 — End: 2022-03-09
  Administered 2022-03-09: 2000 mg via INTRAVENOUS
  Filled 2022-03-09: qty 400

## 2022-03-09 MED ORDER — VANCOMYCIN HCL 1250 MG/250ML IV SOLN
1250.0000 mg | INTRAVENOUS | Status: DC
  Administered 2022-03-10 – 2022-03-12 (×3): 1250 mg via INTRAVENOUS
  Filled 2022-03-09 (×4): qty 250

## 2022-03-09 MED ORDER — IBUPROFEN 600 MG PO TABS
600.0000 mg | ORAL_TABLET | Freq: Once | ORAL | Status: AC
  Administered 2022-03-09: 600 mg via ORAL
  Filled 2022-03-09: qty 1

## 2022-03-09 MED ORDER — SODIUM CHLORIDE 0.9 % IV SOLN
2.0000 g | Freq: Once | INTRAVENOUS | Status: AC
  Administered 2022-03-09: 2 g via INTRAVENOUS
  Filled 2022-03-09: qty 20

## 2022-03-09 MED ORDER — INSULIN ASPART 100 UNIT/ML IJ SOLN
0.0000 [IU] | Freq: Every day | INTRAMUSCULAR | Status: DC
  Administered 2022-03-10: 3 [IU] via SUBCUTANEOUS
  Filled 2022-03-09: qty 0.05

## 2022-03-09 NOTE — ED Triage Notes (Signed)
Pt was sent by PCP for a bite on her left great toe. Pt was seen for same but swelling has gotten progressively worse. Pt has blisters on foot as well.  PCP believes pt needs IV antibiotics.

## 2022-03-09 NOTE — H&P (Signed)
History and Physical    April Cooper:[id number] DOB: August 20, 1966 DOA: 03/09/2022  PCP: Lesleigh Noe, MD   I have briefly reviewed patients previous medical reports in [facility].  Patient coming from: Home  Chief Complaint: Left foot/big toe area pain, swelling, redness and a large blister.  HPI: April Cooper is a 56 year old married female, independent, lives with family, medical history significant for type II DM, HTN, HLD, morbid obesity, presented to the [facility] ED on 03/09/2022 with above complaints.  She was in her usual state of health until approximately 5 days ago when she noted itching in between her left foot first webspace.  By the next 48 hours, this had gotten significantly worse with painful swelling, some darkish looking spots, small blister and painful weightbearing.  On she was seen by her PCP and started on Keflex and Bactrim for suspected cellulitis from a possible insect bite.  2 to 3 days prior to symptom onset, she was in downtown [place] with her family dining but does not recollect any pain from a insect bite or sting.  She does not recollect stepping on any sharp objects and wears footwear all the time.  The same evening on 5/30, the blister between the left foot first webspace got significantly larger and she presented to be ED who concurred with the regimen initiated by PCP and patient returned home.  Today patient had a regular follow-up with her PCP and she also noted progressively worsened swelling, redness, enlarging blister which was darkish in color in the first webspace (pictures below).  PCP referred her to ED for possible IV antibiotics and further interventions.  Patient denies much pain but reports tightness of the left foot.  Denies fever or chills.   ED Course: Afebrile with unremarkable vital signs.  CMP and CBC without abnormality except glucose of 131.  Blood cultures x2 have been drawn and pending.  X-ray of the left foot  shows swelling of the first and second toes without subcutaneous air.  EDP provided a dose of IV vancomycin and ceftriaxone, consulted orthopedics who recommend admission to [facility] for incision and drainage on 6/2.  Review of Systems:  All other systems reviewed and apart from HPI, are negative.  Past Medical History:  Diagnosis Date   AMA (advanced maternal age) multigravida 35+    Diabetes mellitus without complication ([place])    Ectopic pregnancy    Hyperlipidemia    Hyperlipidemia    Hyperlipidemia    Hypertension    Menometrorrhagia 01/2004, 12/2011   Nephrolithiasis    OBESITY    Penicillin allergy 11/04/2002   Vitamin D deficiency    Vulvovaginal candidiasis 07/2006    Past Surgical History:  Procedure Laterality Date   ABLATION  2013   CYSTOSCOPY WITH RETROGRADE PYELOGRAM, URETEROSCOPY AND STENT PLACEMENT Right 01/16/2013   Procedure: CYSTOSCOPY WITH RETROGRADE PYELOGRAM, URETEROSCOPY AND STENT PLACEMENT;  Surgeon: Dutch Gray, MD;  Location: [facility];  Service: Urology;  Laterality: Right;   HOLMIUM LASER APPLICATION Right 9/47/0962   Procedure: HOLMIUM LASER APPLICATION;  Surgeon: Dutch Gray, MD;  Location: [facility];  Service: Urology;  Laterality: Right;   TUBAL LIGATION  2004   TUBAL LIGATION  2004    Social History  reports that she has never smoked. She has never used smokeless tobacco. She reports current alcohol use. She reports that she does not use drugs.  Allergies  Allergen Reactions   Penicillins Hives and Rash  Did it involve swelling of the face/tongue/throat, SOB, or low BP? No Did it involve sudden or severe rash/hives, skin peeling, or any reaction on the inside of your mouth or nose? Yes Did you need to seek medical attention at a hospital or doctor's office? No When did it last happen?   childhood    If all above answers are "NO", may proceed with cephalosporin use.      Family History  Problem Relation Age of Onset   Hypertension  Mother    Hypertension Father    Diabetes Sister    Diabetes Maternal Aunt    Hypertension Maternal Grandmother    Breast cancer Paternal Aunt 47   Breast cancer Paternal Grandmother    Colon cancer Neg Hx      Prior to Admission medications   Medication Sig Start Date End Date Taking? Authorizing Provider  cephALEXin (KEFLEX) 500 MG capsule Take 1 capsule (500 mg total) by mouth 2 (two) times daily for 10 days. 03/07/22 03/17/22 Yes Lesleigh Noe, MD  cholecalciferol (VITAMIN D3) 25 MCG (1000 UNIT) tablet Take 1,000 Units by mouth daily.   Yes [provider]  ibuprofen (ADVIL) 200 MG tablet Take 600 mg by mouth every 6 (six) hours as needed for moderate pain.   Yes [provider]  latanoprost (XALATAN) 0.005 % ophthalmic solution Place 1 drop into both eyes at bedtime. 02/21/22  Yes [provider]  losartan (COZAAR) 50 MG tablet Take 1 tablet (50 mg total) by mouth daily. 01/02/22  Yes Lesleigh Noe, MD  metFORMIN (GLUCOPHAGE) 1000 MG tablet Take 1 tablet (1,000 mg total) by mouth every evening. 07/19/21  Yes Lesleigh Noe, MD  rosuvastatin (CRESTOR) 5 MG tablet TAKE 1 TABLET (5 MG TOTAL) BY MOUTH DAILY. 08/23/21  Yes Lesleigh Noe, MD  sulfamethoxazole-trimethoprim (BACTRIM DS) 800-160 MG tablet Take 1 tablet by mouth 2 (two) times daily for 10 days. 03/07/22 03/17/22 Yes Lesleigh Noe, MD  Accu-Chek FastClix Lancets MISC 1 each by Does not apply route in the morning, at noon, and at bedtime. 01/02/22   Lesleigh Noe, MD  blood glucose meter kit and supplies Dispense based on patient and insurance preference. Use up to four times daily as directed. (FOR ICD-10 E10.9, E11.9). 09/11/19   Cirigliano, Mary K, DO  glucose blood (ACCU-CHEK GUIDE) test strip USE UP TO 4 TIMES A DAY AS DIRECTED 01/02/22   Lesleigh Noe, MD  Semaglutide, 1 MG/DOSE, (OZEMPIC, 1 MG/DOSE,) 4 MG/3ML SOPN Inject 1 mg into the skin once a week. Patient not taking: Reported on 03/07/2022  02/02/22   Lesleigh Noe, MD    Physical Exam: Vitals:   03/09/22 1153 03/09/22 1334  BP: (!) 150/80 (!) 155/70  Pulse: 72 (!) 58  Resp: 18 18  Temp: 97.9 F (36.6 C)   TempSrc: Oral   SpO2: 96% 100%      Constitutional: Pleasant young female, moderately built and morbidly obese, sitting up comfortably in bed without distress.  Patient's daughter is at bedside. Eyes: PERTLA, lids and conjunctivae normal ENMT: Mucous membranes are moist. Posterior pharynx clear of any exudate or lesions. Normal dentition.  Neck: supple, no masses, no thyromegaly Respiratory: Clear to auscultation without wheezing, rhonchi or crackles. No increased work of breathing. Cardiovascular: S1 & S2 heard, regular rate and rhythm. No JVD, murmurs, rubs or clicks. No pedal edema. Abdomen: Non distended. Non tender. Soft. No organomegaly or masses appreciated. No clinical Ascites. Normal bowel  sounds heard. Musculoskeletal: no clubbing / cyanosis. No joint deformity upper and lower extremities. Good ROM, no contractures. Normal muscle tone.  Skin: Left foot distal to the ankle is swollen, more so the forefoot around the left great toe.  Left great toe swollen, erythema along the medial aspect, a large blister in the first webspace extending both ventrally and dorsally, darkish in appearance and slightly tense and tender.  Compartments are soft.  Details as in picture below from 03/09/2022. Neurologic: CN 2-12 grossly intact. Sensation intact, DTR normal. Strength 5/5 in all 4 limbs.  Psychiatric: Normal judgment and insight. Alert and oriented x 3. Normal mood.        Labs on Admission: I have personally reviewed following labs and imaging studies  CBC: Recent Labs  Lab 03/09/22 1409  WBC 7.7  NEUTROABS 4.4  HGB 14.2  HCT 45.2  MCV 89.2  PLT 944    Basic Metabolic Panel: Recent Labs  Lab 03/09/22 1409  NA 138  K 4.2  CL 107  CO2 24  GLUCOSE 131*  BUN 12  CREATININE 1.00  CALCIUM 8.9     Liver Function Tests: Recent Labs  Lab 03/09/22 1409  AST 15  ALT 21  ALKPHOS 68  BILITOT 0.5  PROT 7.7  ALBUMIN 3.9    Radiological Exams on Admission: DG Foot Complete Left  Result Date: 03/09/2022 CLINICAL DATA:  Soft tissue wound.  Bite on left great toe. EXAM: LEFT FOOT - COMPLETE 3+ VIEW COMPARISON:  None Available. FINDINGS: Moderate great toe soft tissue swelling. Likely additional soft tissue swelling of the proximal aspect of the second toe. Mild great toe metatarsophalangeal joint space narrowing with mild-to-moderate peripheral lateral greater than medial and dorsal degenerative osteophytes. Small plantar calcaneal heel spur. No acute fracture is seen. No dislocation. IMPRESSION: 1. Mild great toe metatarsophalangeal joint osteoarthritis. 2. Swelling of the first and second toes. No subcutaneous air is seen. Electronically Signed   By: Yvonne Kendall M.D.   On: 03/09/2022 14:13      Assessment/Plan Principal Problem:   Cellulitis and abscess of foot Active Problems:   Controlled type 2 diabetes mellitus without complication, without long-term current use of insulin (HCC)   Hyperlipidemia associated with type 2 diabetes mellitus (HCC)   Class 3 severe obesity due to excess calories with serious comorbidity and body mass index (BMI) of 40.0 to 44.9 in adult [facility])   Essential hypertension     Cellulitis of left foot/blister in the first webspace, rule out abscess, secondary to suspected insect bite: Failed outpatient Keflex and Bactrim X-rays without comment about osteomyelitis It is possible that the large blister is a simple hemorrhagic blister but could also be an abscess. Continue empirically started IV vancomycin. Discussed with orthopedics team and as per their recommendations, patient will be transferred to [facility] for possible I&D on 6/2.  Type II DM: A1c 7.1 on 01/02/2022 Currently on metformin alone at home.  Used to be on Ozempic and another  medication but unable to tolerate and has been off of this for 3 weeks. Hold metformin and use NovoLog SSI. Monitor CBGs closely and adjust insulins as needed.  Essential hypertension: Continue Cozaar 50 Mg daily  Hyperlipidemia Continue home dose of statins  Morbid obesity/BMI 41.2: Outpatient follow-up.  Needs lifestyle modification and weight loss.  DVT prophylaxis: Lovenox Code Status: Full, confirmed with patient in the presence of her daughter at bedside Family Communication: Daughter was at bedside throughout the course of my  visit.  Updated her. Disposition Plan:   Patient is from:  Home  Anticipated DC to:  Home  Anticipated DC date:  03/11/2022  Anticipated DC barriers: None   Consults called: Orthopedics, called by EDP.  Dr. Sharol Given to see in AM. Admission status: Inpatient, medical bed.  Severity of Illness: The appropriate patient status for this patient is INPATIENT. Inpatient status is judged to be reasonable and necessary in order to provide the required intensity of service to ensure the patient's safety. The patient's presenting symptoms, physical exam findings, and initial radiographic and laboratory data in the context of their chronic comorbidities is felt to place them at high risk for further clinical deterioration. Furthermore, it is not anticipated that the patient will be medically stable for discharge from the hospital within 2 midnights of admission.   * I certify that at the point of admission it is my clinical judgment that the patient will require inpatient hospital care spanning beyond 2 midnights from the point of admission due to high intensity of service, high risk for further deterioration and high frequency of surveillance required.Vernell Leep MD [facility]  To contact the attending provider between 7A-7P or the covering provider during after hours 7P-7A, please log into the web site [email or web address] and access using universal [facility] password for that web site. If you do not have the password, please call the hospital operator.  03/09/2022, 4:39 PM

## 2022-03-09 NOTE — Progress Notes (Signed)
Pharmacy Antibiotic Note  April Cooper is a 56 y.o. female admitted on 03/09/2022 with L foot/big toe area pain, swelling, large blister worsening despite outpatient antibiotics with Keflex and Bactrim.  Pharmacy has been consulted for vancomycin dosing.  Received vancomycin 2g IV x1 and ceftriaxone 2g IV x1 in the ED. Planning I&D on 6/2.   Plan: Vancomycin '1250mg'$  IV q24 hours (eAUC 486, Scr 1, Vd 0.5) Monitor renal function, culture data, ortho recommendations     Temp (24hrs), Avg:97.5 F (36.4 C), Min:97.1 F (36.2 C), Max:97.9 F (36.6 C)  Recent Labs  Lab 03/09/22 1409  WBC 7.7  CREATININE 1.00  LATICACIDVEN 1.3    Estimated Creatinine Clearance: 73.5 mL/min (by C-G formula based on SCr of 1 mg/dL).    Allergies  Allergen Reactions   Penicillins Hives and Rash    Did it involve swelling of the face/tongue/throat, SOB, or low BP? No Did it involve sudden or severe rash/hives, skin peeling, or any reaction on the inside of your mouth or nose? Yes Did you need to seek medical attention at a hospital or doctor's office? No When did it last happen?   childhood    If all above answers are "NO", may proceed with cephalosporin use.      Antimicrobials this admission: Vancomycin 6/1 >>  Ceftriaxone x1 6/1  Dose adjustments this admission:  Microbiology results: 6/1 BCx:   Thank you for allowing pharmacy to be a part of this patient's care.  Dimple Nanas, PharmD 03/09/2022 5:04 PM

## 2022-03-09 NOTE — Assessment & Plan Note (Signed)
Worse. Blister has tripled in size. Has been taking Bactrim and keflex without improvement. Advised ER evaluation for IV therapy and consideration for imaging for deeper infection. Currently no systemic symptoms but concerning for possible osteomyelitis or deeper infection.

## 2022-03-09 NOTE — Progress Notes (Signed)
   Subjective:     April Cooper is a 56 y.o. female presenting for Follow-up (Bug bite )     HPI  #Skin infection - taking abx - pain improved on ibuprofen - blister has spread - painful to touch - redness of foot is slightly worse from marking on Tuesday night   Review of Systems  Constitutional:  Negative for chills and fever.  Gastrointestinal:  Negative for nausea.    Social History   Tobacco Use  Smoking Status Never  Smokeless Tobacco Never        Objective:    BP Readings from Last 3 Encounters:  03/09/22 140/68  03/07/22 (!) 151/110  03/07/22 (!) 160/80   Wt Readings from Last 3 Encounters:  03/09/22 236 lb 5 oz (107.2 kg)  03/07/22 235 lb 9 oz (106.9 kg)  01/02/22 231 lb 7 oz (105 kg)    BP 140/68   Pulse 72   Temp (!) 97.1 F (36.2 C) (Temporal)   Wt 236 lb 5 oz (107.2 kg)   SpO2 98%   BMI 41.20 kg/m    Physical Exam Constitutional:      General: She is not in acute distress.    Appearance: She is well-developed. She is not diaphoretic.  HENT:     Right Ear: External ear normal.     Left Ear: External ear normal.     Nose: Nose normal.  Eyes:     Conjunctiva/sclera: Conjunctivae normal.  Cardiovascular:     Rate and Rhythm: Normal rate.  Pulmonary:     Effort: Pulmonary effort is normal.  Musculoskeletal:     Cervical back: Neck supple.  Skin:    General: Skin is warm and dry.     Capillary Refill: Capillary refill takes less than 2 seconds.     Comments: Left foot Blister now covering the base of the foot along the pad below the first and second digit, as well as the dorsal surface below the first digit and covering the second digit.  Erythema and warmth. Erythema extending anteriorly slightly beyond marking from Tuesday.   Neurological:     Mental Status: She is alert. Mental status is at baseline.  Psychiatric:        Mood and Affect: Mood normal.        Behavior: Behavior normal.          Assessment & Plan:    Problem List Items Addressed This Visit       Other   Cellulitis of left lower extremity - Primary    Worse. Blister has tripled in size. Has been taking Bactrim and keflex without improvement. Advised ER evaluation for IV therapy and consideration for imaging for deeper infection. Currently no systemic symptoms but concerning for possible osteomyelitis or deeper infection.        Sign out to [place] to go by car - daughter is driving  Return for after ER.  Lesleigh Noe, MD

## 2022-03-09 NOTE — Progress Notes (Signed)
A consult was received from an ED physician for vancomycin per pharmacy dosing.  The patient's profile has been reviewed for ht/wt/allergies/indication/available labs.   A one time order has been placed for vancomycin 2g IV x1.    Further antibiotics/pharmacy consults should be ordered by admitting physician if indicated.                       Thank you,  Dimple Nanas, PharmD 03/09/2022 2:03 PM

## 2022-03-09 NOTE — ED Provider Notes (Signed)
**April April** April April   [name] Arrival date & time: 03/09/22  1144     History {Add pertinent medical, surgical, social history, OB history to HPI:1} Chief Complaint  Patient presents with  . Swelling in [place] is a 56 y.o. female.  HPI Patient is a type II diabetic.  She is typically well controlled and is not having any recent problems.  Patient reports that 8 days ago she was sitting out on the porch and felt a stinging burning between her great toe and second toe on the left foot.  She thought she had gotten a sting or bite but did not see what had done it.  She reports she was getting some leg discomfort and itching at the site but it was not until 5 days later when it got fairly painful.  She started to notice some pain with walking in the following 2 days then she started noting a blister develop and redness.  She got seen by her primary care doctor 3 days ago and at that time had a small blister that it developed with some redness.  Patient was started on combination of Bactrim and Keflex.  She reports that the blister, swelling and redness has increased dramatically over the past day.  She has not had fever chills or general constitutional symptoms.  She reports she otherwise feels well.  She has been taking ibuprofen to control discomfort and swelling.      Home Medications Prior to Admission medications   Medication Sig Start Date End Date Taking? Authorizing Provider  cephALEXin (KEFLEX) 500 MG capsule Take 1 capsule (500 mg total) by mouth 2 (two) times daily for 10 days. 03/07/22 03/17/22 Yes Lesleigh Noe, MD  cholecalciferol (VITAMIN D3) 25 MCG (1000 UNIT) tablet Take 1,000 Units by mouth daily.   Yes [provider]  ibuprofen (ADVIL) 200 MG tablet Take 600 mg by mouth every 6 (six) hours as needed for moderate pain.   Yes [provider]  latanoprost (XALATAN) 0.005 % ophthalmic solution  Place 1 drop into both eyes at bedtime. 02/21/22  Yes [provider]  losartan (COZAAR) 50 MG tablet Take 1 tablet (50 mg total) by mouth daily. 01/02/22  Yes Lesleigh Noe, MD  metFORMIN (GLUCOPHAGE) 1000 MG tablet Take 1 tablet (1,000 mg total) by mouth every evening. 07/19/21  Yes Lesleigh Noe, MD  rosuvastatin (CRESTOR) 5 MG tablet TAKE 1 TABLET (5 MG TOTAL) BY MOUTH DAILY. 08/23/21  Yes Lesleigh Noe, MD  sulfamethoxazole-trimethoprim (BACTRIM DS) 800-160 MG tablet Take 1 tablet by mouth 2 (two) times daily for 10 days. 03/07/22 03/17/22 Yes Lesleigh Noe, MD  Accu-Chek FastClix Lancets MISC 1 each by Does not apply route in the morning, at noon, and at bedtime. 01/02/22   Lesleigh Noe, MD  blood glucose meter kit and supplies Dispense based on patient and insurance preference. Use up to four times daily as directed. (FOR ICD-10 E10.9, E11.9). 09/11/19   Cirigliano, Mary K, DO  glucose blood (ACCU-CHEK GUIDE) test strip USE UP TO 4 TIMES A DAY AS DIRECTED 01/02/22   Lesleigh Noe, MD  Semaglutide, 1 MG/DOSE, (OZEMPIC, 1 MG/DOSE,) 4 MG/3ML SOPN Inject 1 mg into the skin once a week. Patient not taking: Reported on 03/07/2022 02/02/22   Lesleigh Noe, MD      Allergies    Penicillins    Review of Systems  Review of Systems 10 systems reviewed negative except as per HPI Physical Exam Updated Vital Signs BP (!) 155/70   Pulse (!) 58   Temp 97.9 F (36.6 C) (Oral)   Resp 18   SpO2 100%  Physical Exam Constitutional:      Comments: Alert nontoxic no respiratory distress.  Clinically well in appearance.  Eyes:     Extraocular Movements: Extraocular movements intact.  Cardiovascular:     Rate and Rhythm: Normal rate and regular rhythm.  Pulmonary:     Effort: Pulmonary effort is normal.     Breath sounds: Normal breath sounds.  Abdominal:     General: There is no distension.     Palpations: Abdomen is soft.     Tenderness: There is no abdominal tenderness.   Musculoskeletal:     Comments: Patient has a large blister with ecchymotic appearance between the first and second toes on the left foot.  Moderate diffuse swelling of the left foot.  See attached images  Skin:    General: Skin is warm and dry.  Neurological:     General: No focal deficit present.     Mental Status: She is oriented to person, place, and time.     Motor: No weakness.     Coordination: Coordination normal.  Psychiatric:        Mood and Affect: Mood normal.   In order, images represent  early appearance from the patient's phone documentation.  Last images are in the ED today.               ED Results / Procedures / Treatments   Labs (all labs ordered are listed, but only abnormal results are displayed) Labs Reviewed  COMPREHENSIVE METABOLIC PANEL - Abnormal; Notable for the following components:      Result Value   Glucose, Bld 131 (*)    All other components within normal limits  CULTURE, BLOOD (ROUTINE X 2)  CULTURE, BLOOD (ROUTINE X 2)  LACTIC ACID, PLASMA  CBC WITH DIFFERENTIAL/PLATELET  PROTIME-INR  LACTIC ACID, PLASMA  SEDIMENTATION RATE  C-REACTIVE PROTEIN    EKG None  Radiology DG Foot Complete Left  Result Date: 03/09/2022 CLINICAL DATA:  Soft tissue wound.  Bite on left great toe. EXAM: LEFT FOOT - COMPLETE 3+ VIEW COMPARISON:  None Available. FINDINGS: Moderate great toe soft tissue swelling. Likely additional soft tissue swelling of the proximal aspect of the second toe. Mild great toe metatarsophalangeal joint space narrowing with mild-to-moderate peripheral lateral greater than medial and dorsal degenerative osteophytes. Small plantar calcaneal heel spur. No acute fracture is seen. No dislocation. IMPRESSION: 1. Mild great toe metatarsophalangeal joint osteoarthritis. 2. Swelling of the first and second toes. No subcutaneous air is seen. Electronically Signed   By: Yvonne Kendall M.D.   On: 03/09/2022 14:13    Procedures Procedures   {Document cardiac monitor, telemetry assessment procedure when appropriate:1}  Medications Ordered in ED Medications  vancomycin (VANCOREADY) IVPB 2000 mg/400 mL (2,000 mg Intravenous New Bag/Given 03/09/22 1457)  cefTRIAXone (ROCEPHIN) 2 g in sodium chloride 0.9 % 100 mL IVPB (2 g Intravenous New Bag/Given 03/09/22 1425)  0.9 %  sodium chloride infusion ( Intravenous New Bag/Given 03/09/22 1425)    ED Course/ Medical Decision Making/ A&P                           Medical Decision Making Amount and/or Complexity of Data Reviewed Labs: ordered. Radiology: ordered.  Risk  Prescription drug management. Decision regarding hospitalization.   Consult: 1400 reviewed with Dr. Percell Miller orthopedics.  Will do consult.  {Document critical care time when appropriate:1} {Document review of labs and clinical decision tools ie heart score, Chads2Vasc2 etc:1}  {Document your independent review of radiology images, and any outside records:1} {Document your discussion with family members, caretakers, and with consultants:1} {Document social determinants of health affecting pt's care:1} {Document your decision making why or why not admission, treatments were needed:1} Final Clinical Impression(s) / ED Diagnoses Final diagnoses:  Diabetic foot infection ([place])    Rx / DC Orders ED Discharge Orders     None

## 2022-03-10 ENCOUNTER — Inpatient Hospital Stay (HOSPITAL_COMMUNITY): Payer: 59 | Admitting: Anesthesiology

## 2022-03-10 ENCOUNTER — Encounter (HOSPITAL_COMMUNITY): Payer: Self-pay | Admitting: Internal Medicine

## 2022-03-10 ENCOUNTER — Encounter (HOSPITAL_COMMUNITY)
Admission: EM | Disposition: A | Discharge status: Home / Self Care | Payer: Self-pay | Source: Home / Self Care | Attending: Internal Medicine

## 2022-03-10 ENCOUNTER — Other Ambulatory Visit: Payer: Self-pay

## 2022-03-10 DIAGNOSIS — L089 Local infection of the skin and subcutaneous tissue, unspecified: Secondary | ICD-10-CM | POA: Diagnosis not present

## 2022-03-10 DIAGNOSIS — Z7984 Long term (current) use of oral hypoglycemic drugs: Secondary | ICD-10-CM

## 2022-03-10 DIAGNOSIS — L03116 Cellulitis of left lower limb: Secondary | ICD-10-CM

## 2022-03-10 DIAGNOSIS — L02612 Cutaneous abscess of left foot: Secondary | ICD-10-CM | POA: Diagnosis not present

## 2022-03-10 DIAGNOSIS — E1165 Type 2 diabetes mellitus with hyperglycemia: Secondary | ICD-10-CM

## 2022-03-10 DIAGNOSIS — E11628 Type 2 diabetes mellitus with other skin complications: Secondary | ICD-10-CM

## 2022-03-10 HISTORY — PX: I & D EXTREMITY: SHX5045

## 2022-03-10 LAB — GLUCOSE, CAPILLARY
Glucose-Capillary: 160 mg/dL — ABNORMAL HIGH (ref 70–99)
Glucose-Capillary: 150 mg/dL — ABNORMAL HIGH (ref 70–99)
Glucose-Capillary: 140 mg/dL — ABNORMAL HIGH (ref 70–99)
Glucose-Capillary: 136 mg/dL — ABNORMAL HIGH (ref 70–99)
Glucose-Capillary: 276 mg/dL — ABNORMAL HIGH (ref 70–99)
Glucose-Capillary: 251 mg/dL — ABNORMAL HIGH (ref 70–99)

## 2022-03-10 LAB — CBC
HCT: 39.1 % (ref 36.0–46.0)
Hemoglobin: 12.5 g/dL (ref 12.0–15.0)
MCH: 28.1 pg (ref 26.0–34.0)
MCHC: 32 g/dL (ref 30.0–36.0)
MCV: 87.9 fL (ref 80.0–100.0)
Platelets: 275 10*3/uL (ref 150–400)
RBC: 4.45 MIL/uL (ref 3.87–5.11)
RDW: 14.1 % (ref 11.5–15.5)
WBC: 6.1 10*3/uL (ref 4.0–10.5)
nRBC: 0 % (ref 0.0–0.2)

## 2022-03-10 LAB — SURGICAL PCR SCREEN
MRSA, PCR: NEGATIVE
Staphylococcus aureus: NEGATIVE

## 2022-03-10 LAB — BASIC METABOLIC PANEL
Anion gap: 6 (ref 5–15)
BUN: 10 mg/dL (ref 6–20)
CO2: 21 mmol/L — ABNORMAL LOW (ref 22–32)
Calcium: 8.6 mg/dL — ABNORMAL LOW (ref 8.9–10.3)
Chloride: 111 mmol/L (ref 98–111)
Creatinine, Ser: 0.81 mg/dL (ref 0.44–1.00)
GFR, Estimated: >60 mL/min (ref >60)
Glucose, Bld: 161 mg/dL — ABNORMAL HIGH (ref 70–99)
Potassium: 4.5 mmol/L (ref 3.5–5.1)
Sodium: 138 mmol/L (ref 135–145)

## 2022-03-10 LAB — HIV ANTIBODY (ROUTINE TESTING W REFLEX): HIV Screen 4th Generation wRfx: NONREACTIVE

## 2022-03-10 SURGERY — IRRIGATION AND DEBRIDEMENT EXTREMITY
Anesthesia: General | Laterality: Left

## 2022-03-10 MED ORDER — PROPOFOL 10 MG/ML IV BOLUS
INTRAVENOUS | Status: DC | PRN
  Administered 2022-03-10: 150 mg via INTRAVENOUS

## 2022-03-10 MED ORDER — ORAL CARE MOUTH RINSE: 15.0000 mL | Freq: Once | OROMUCOSAL | Status: AC

## 2022-03-10 MED ORDER — PROPOFOL 500 MG/50ML IV EMUL
INTRAVENOUS | Status: DC | PRN
  Administered 2022-03-10: 150 ug/kg/min via INTRAVENOUS

## 2022-03-10 MED ORDER — ONDANSETRON HCL 4 MG PO TABS: 4.0000 mg | ORAL_TABLET | Freq: Four times a day (QID) | ORAL | Status: DC | PRN

## 2022-03-10 MED ORDER — FENTANYL CITRATE (PF) 100 MCG/2ML IJ SOLN
25.0000 ug | INTRAMUSCULAR | Status: DC | PRN
  Administered 2022-03-10 (×2): 50 ug via INTRAVENOUS

## 2022-03-10 MED ORDER — ONDANSETRON HCL 4 MG/2ML IJ SOLN
INTRAMUSCULAR | Status: DC | PRN
  Administered 2022-03-10: 4 mg via INTRAVENOUS

## 2022-03-10 MED ORDER — SCOPOLAMINE 1 MG/3DAYS TD PT72
MEDICATED_PATCH | TRANSDERMAL | Status: AC
  Filled 2022-03-10: qty 1

## 2022-03-10 MED ORDER — ACETAMINOPHEN 325 MG PO TABS
650.0000 mg | ORAL_TABLET | Freq: Four times a day (QID) | ORAL | Status: DC | PRN
  Administered 2022-03-10: 650 mg via ORAL
  Filled 2022-03-10: qty 2

## 2022-03-10 MED ORDER — LOSARTAN POTASSIUM 50 MG PO TABS
50.0000 mg | ORAL_TABLET | Freq: Every day | ORAL | Status: DC
  Administered 2022-03-11 – 2022-03-13 (×3): 50 mg via ORAL
  Filled 2022-03-10 (×4): qty 1

## 2022-03-10 MED ORDER — DEXAMETHASONE SODIUM PHOSPHATE 10 MG/ML IJ SOLN
INTRAMUSCULAR | Status: DC | PRN
  Administered 2022-03-10: 5 mg via INTRAVENOUS

## 2022-03-10 MED ORDER — MIDAZOLAM HCL 2 MG/2ML IJ SOLN
INTRAMUSCULAR | Status: DC | PRN
  Administered 2022-03-10: 2 mg via INTRAVENOUS

## 2022-03-10 MED ORDER — VITAMIN D 25 MCG (1000 UNIT) PO TABS
1000.0000 [IU] | ORAL_TABLET | Freq: Every day | ORAL | Status: DC
  Administered 2022-03-11 – 2022-03-13 (×3): 1000 [IU] via ORAL
  Filled 2022-03-10 (×4): qty 1

## 2022-03-10 MED ORDER — HYDROCODONE-ACETAMINOPHEN 7.5-325 MG PO TABS: 1.0000 | ORAL_TABLET | ORAL | Status: DC | PRN

## 2022-03-10 MED ORDER — BISACODYL 10 MG RE SUPP: 10.0000 mg | Freq: Every day | RECTAL | Status: DC | PRN

## 2022-03-10 MED ORDER — DOCUSATE SODIUM 100 MG PO CAPS
100.0000 mg | ORAL_CAPSULE | Freq: Two times a day (BID) | ORAL | Status: DC
  Administered 2022-03-10 – 2022-03-13 (×7): 100 mg via ORAL
  Filled 2022-03-10 (×7): qty 1

## 2022-03-10 MED ORDER — MIDAZOLAM HCL 2 MG/2ML IJ SOLN
INTRAMUSCULAR | Status: AC
  Filled 2022-03-10: qty 2

## 2022-03-10 MED ORDER — METOCLOPRAMIDE HCL 5 MG/ML IJ SOLN: 5.0000 mg | Freq: Three times a day (TID) | INTRAMUSCULAR | Status: DC | PRN

## 2022-03-10 MED ORDER — PROPOFOL 10 MG/ML IV BOLUS
INTRAVENOUS | Status: AC
  Filled 2022-03-10: qty 20

## 2022-03-10 MED ORDER — METHOCARBAMOL 1000 MG/10ML IJ SOLN: 500.0000 mg | Freq: Four times a day (QID) | INTRAVENOUS | Status: DC | PRN

## 2022-03-10 MED ORDER — OXYCODONE HCL 5 MG PO TABS
5.0000 mg | ORAL_TABLET | ORAL | Status: DC | PRN
  Administered 2022-03-10 – 2022-03-11 (×2): 5 mg via ORAL
  Filled 2022-03-10 (×3): qty 1

## 2022-03-10 MED ORDER — HYDROCODONE-ACETAMINOPHEN 5-325 MG PO TABS: 1.0000 | ORAL_TABLET | ORAL | Status: DC | PRN

## 2022-03-10 MED ORDER — CHLORHEXIDINE GLUCONATE 4 % EX LIQD
60.0000 mL | Freq: Once | CUTANEOUS | Status: DC
  Filled 2022-03-10: qty 60

## 2022-03-10 MED ORDER — CHLORHEXIDINE GLUCONATE 0.12 % MT SOLN: 15.0000 mL | Freq: Once | OROMUCOSAL | Status: AC

## 2022-03-10 MED ORDER — METHOCARBAMOL 500 MG PO TABS: 500.0000 mg | ORAL_TABLET | Freq: Four times a day (QID) | ORAL | Status: DC | PRN

## 2022-03-10 MED ORDER — OXYCODONE HCL 5 MG/5ML PO SOLN: 5.0000 mg | Freq: Once | ORAL | Status: DC | PRN

## 2022-03-10 MED ORDER — CHLORHEXIDINE GLUCONATE 0.12 % MT SOLN
OROMUCOSAL | Status: AC
  Administered 2022-03-10: 15 mL
  Filled 2022-03-10: qty 15

## 2022-03-10 MED ORDER — LATANOPROST 0.005 % OP SOLN
1.0000 [drp] | Freq: Every day | OPHTHALMIC | Status: DC
Start: 2022-03-10 — End: 2022-03-13
  Administered 2022-03-11 – 2022-03-12 (×3): 1 [drp] via OPHTHALMIC
  Filled 2022-03-10 (×2): qty 2.5

## 2022-03-10 MED ORDER — AMISULPRIDE (ANTIEMETIC) 5 MG/2ML IV SOLN: 10.0000 mg | Freq: Once | INTRAVENOUS | Status: DC | PRN

## 2022-03-10 MED ORDER — LACTATED RINGERS IV SOLN: INTRAVENOUS | Status: DC

## 2022-03-10 MED ORDER — INSULIN ASPART 100 UNIT/ML IJ SOLN: 0.0000 [IU] | INTRAMUSCULAR | Status: DC | PRN

## 2022-03-10 MED ORDER — ENOXAPARIN SODIUM 40 MG/0.4ML IJ SOSY
40.0000 mg | PREFILLED_SYRINGE | INTRAMUSCULAR | Status: DC
  Filled 2022-03-10: qty 0.4

## 2022-03-10 MED ORDER — FENTANYL CITRATE (PF) 250 MCG/5ML IJ SOLN
INTRAMUSCULAR | Status: AC
  Filled 2022-03-10: qty 5

## 2022-03-10 MED ORDER — ACETAMINOPHEN 650 MG RE SUPP: 650.0000 mg | Freq: Four times a day (QID) | RECTAL | Status: DC | PRN

## 2022-03-10 MED ORDER — SODIUM CHLORIDE 0.9 % IV SOLN: 250.0000 mL | INTRAVENOUS | Status: DC | PRN

## 2022-03-10 MED ORDER — ONDANSETRON HCL 4 MG/2ML IJ SOLN
4.0000 mg | Freq: Four times a day (QID) | INTRAMUSCULAR | Status: DC | PRN
  Administered 2022-03-10: 4 mg via INTRAVENOUS
  Filled 2022-03-10: qty 2

## 2022-03-10 MED ORDER — MORPHINE SULFATE (PF) 2 MG/ML IV SOLN: 0.5000 mg | INTRAVENOUS | Status: DC | PRN

## 2022-03-10 MED ORDER — IBUPROFEN 600 MG PO TABS
600.0000 mg | ORAL_TABLET | Freq: Four times a day (QID) | ORAL | Status: DC | PRN
Start: 2022-03-10 — End: 2022-03-13
  Administered 2022-03-12 – 2022-03-13 (×2): 600 mg via ORAL
  Filled 2022-03-10 (×2): qty 1

## 2022-03-10 MED ORDER — ACETAMINOPHEN 500 MG PO TABS
1000.0000 mg | ORAL_TABLET | Freq: Once | ORAL | Status: AC
  Administered 2022-03-10: 1000 mg via ORAL
  Filled 2022-03-10: qty 2

## 2022-03-10 MED ORDER — POLYETHYLENE GLYCOL 3350 17 G PO PACK: 17.0000 g | PACK | Freq: Every day | ORAL | Status: DC | PRN

## 2022-03-10 MED ORDER — POVIDONE-IODINE 10 % EX SWAB
2.0000 "application " | Freq: Once | CUTANEOUS | Status: AC
  Administered 2022-03-10: 2 via TOPICAL

## 2022-03-10 MED ORDER — ENOXAPARIN SODIUM 60 MG/0.6ML IJ SOSY
50.0000 mg | PREFILLED_SYRINGE | INTRAMUSCULAR | Status: DC
  Administered 2022-03-11 – 2022-03-13 (×3): 50 mg via SUBCUTANEOUS
  Filled 2022-03-10 (×3): qty 0.6

## 2022-03-10 MED ORDER — ONDANSETRON HCL 4 MG/2ML IJ SOLN
4.0000 mg | Freq: Once | INTRAMUSCULAR | Status: DC | PRN
Start: 2022-03-10 — End: 2022-03-10

## 2022-03-10 MED ORDER — LIDOCAINE HCL (PF) 1 % IJ SOLN
INTRAMUSCULAR | Status: AC
  Filled 2022-03-10: qty 30

## 2022-03-10 MED ORDER — ACETAMINOPHEN 325 MG PO TABS
325.0000 mg | ORAL_TABLET | Freq: Four times a day (QID) | ORAL | Status: DC | PRN
  Administered 2022-03-10 – 2022-03-13 (×6): 650 mg via ORAL
  Filled 2022-03-10 (×6): qty 2

## 2022-03-10 MED ORDER — CEFAZOLIN SODIUM-DEXTROSE 2-4 GM/100ML-% IV SOLN
INTRAVENOUS | Status: AC
  Filled 2022-03-10: qty 100

## 2022-03-10 MED ORDER — SCOPOLAMINE 1 MG/3DAYS TD PT72
1.0000 | MEDICATED_PATCH | TRANSDERMAL | Status: DC
  Administered 2022-03-10 – 2022-03-13 (×2): 1.5 mg via TRANSDERMAL
  Filled 2022-03-10 (×2): qty 1

## 2022-03-10 MED ORDER — SODIUM CHLORIDE 0.9% FLUSH: 3.0000 mL | INTRAVENOUS | Status: DC | PRN

## 2022-03-10 MED ORDER — ENSURE PRE-SURGERY PO LIQD: 296.0000 mL | Freq: Once | ORAL | Status: DC

## 2022-03-10 MED ORDER — FENTANYL CITRATE (PF) 250 MCG/5ML IJ SOLN
INTRAMUSCULAR | Status: DC | PRN
  Administered 2022-03-10 (×3): 25 ug via INTRAVENOUS

## 2022-03-10 MED ORDER — ROSUVASTATIN CALCIUM 5 MG PO TABS
5.0000 mg | ORAL_TABLET | Freq: Every day | ORAL | Status: DC
  Administered 2022-03-11 – 2022-03-13 (×3): 5 mg via ORAL
  Filled 2022-03-10 (×4): qty 1

## 2022-03-10 MED ORDER — SODIUM CHLORIDE 0.9 % IV SOLN: INTRAVENOUS | Status: DC

## 2022-03-10 MED ORDER — OXYCODONE HCL 5 MG PO TABS: 5.0000 mg | ORAL_TABLET | Freq: Once | ORAL | Status: DC | PRN

## 2022-03-10 MED ORDER — FENTANYL CITRATE (PF) 100 MCG/2ML IJ SOLN
INTRAMUSCULAR | Status: AC
  Filled 2022-03-10: qty 2

## 2022-03-10 MED ORDER — CEFAZOLIN SODIUM-DEXTROSE 2-4 GM/100ML-% IV SOLN
2.0000 g | INTRAVENOUS | Status: DC
  Filled 2022-03-10: qty 100

## 2022-03-10 MED ORDER — SODIUM CHLORIDE 0.9% FLUSH
3.0000 mL | Freq: Two times a day (BID) | INTRAVENOUS | Status: DC
  Administered 2022-03-10 – 2022-03-13 (×6): 3 mL via INTRAVENOUS

## 2022-03-10 MED ORDER — SODIUM CHLORIDE 0.9 % IR SOLN
Status: DC | PRN
  Administered 2022-03-10: 1000 mL

## 2022-03-10 MED ORDER — LIDOCAINE 2% (20 MG/ML) 5 ML SYRINGE
INTRAMUSCULAR | Status: DC | PRN
  Administered 2022-03-10: 80 mg via INTRAVENOUS

## 2022-03-10 MED ORDER — METOCLOPRAMIDE HCL 5 MG PO TABS: 5.0000 mg | ORAL_TABLET | Freq: Three times a day (TID) | ORAL | Status: DC | PRN

## 2022-03-10 SURGICAL SUPPLY — 36 items
BAG COUNTER SPONGE SURGICOUNT (BAG) IMPLANT
BAG SPNG CNTER NS LX DISP (BAG)
BLADE SURG 21 STRL SS (BLADE) ×3 IMPLANT
BNDG COHESIVE 6X5 TAN NS LF (GAUZE/BANDAGES/DRESSINGS) ×1 IMPLANT
BNDG COHESIVE 6X5 TAN STRL LF (GAUZE/BANDAGES/DRESSINGS) IMPLANT
BNDG GAUZE ELAST 4 BULKY (GAUZE/BANDAGES/DRESSINGS) ×5 IMPLANT
COVER SURGICAL LIGHT HANDLE (MISCELLANEOUS) ×6 IMPLANT
DRAIN PENROSE 0.25X18 (DRAIN) ×1 IMPLANT
DRAPE U-SHAPE 47X51 STRL (DRAPES) ×3 IMPLANT
DRSG ADAPTIC 3X8 NADH LF (GAUZE/BANDAGES/DRESSINGS) ×3 IMPLANT
DRSG EMULSION OIL 3X3 NADH (GAUZE/BANDAGES/DRESSINGS) ×1 IMPLANT
DURAPREP 26ML APPLICATOR (WOUND CARE) ×3 IMPLANT
ELECT REM PT RETURN 9FT ADLT (ELECTROSURGICAL)
ELECTRODE REM PT RTRN 9FT ADLT (ELECTROSURGICAL) IMPLANT
GAUZE PAD ABD 8X10 STRL (GAUZE/BANDAGES/DRESSINGS) ×1 IMPLANT
GAUZE SPONGE 4X4 12PLY STRL (GAUZE/BANDAGES/DRESSINGS) ×3 IMPLANT
GLOVE BIOGEL PI IND STRL 9 (GLOVE) ×2 IMPLANT
GLOVE BIOGEL PI INDICATOR 9 (GLOVE) ×1
GLOVE SURG ORTHO 9.0 STRL STRW (GLOVE) ×3 IMPLANT
GOWN STRL REUS W/ TWL XL LVL3 (GOWN DISPOSABLE) ×4 IMPLANT
GOWN STRL REUS W/TWL XL LVL3 (GOWN DISPOSABLE) ×4
HANDPIECE INTERPULSE COAX TIP (DISPOSABLE)
KIT BASIN OR (CUSTOM PROCEDURE TRAY) ×3 IMPLANT
KIT TURNOVER KIT B (KITS) ×3 IMPLANT
MANIFOLD NEPTUNE II (INSTRUMENTS) ×3 IMPLANT
NS IRRIG 1000ML POUR BTL (IV SOLUTION) ×3 IMPLANT
PACK ORTHO EXTREMITY (CUSTOM PROCEDURE TRAY) ×3 IMPLANT
PAD ARMBOARD 7.5X6 YLW CONV (MISCELLANEOUS) ×6 IMPLANT
SET HNDPC FAN SPRY TIP SCT (DISPOSABLE) IMPLANT
STOCKINETTE IMPERVIOUS 9X36 MD (GAUZE/BANDAGES/DRESSINGS) IMPLANT
SUT ETHILON 2 0 PSLX (SUTURE) ×3 IMPLANT
SWAB COLLECTION DEVICE MRSA (MISCELLANEOUS) ×3 IMPLANT
SWAB CULTURE ESWAB REG 1ML (MISCELLANEOUS) IMPLANT
TOWEL GREEN STERILE (TOWEL DISPOSABLE) ×3 IMPLANT
TUBE CONNECTING 12X1/4 (SUCTIONS) ×3 IMPLANT
YANKAUER SUCT BULB TIP NO VENT (SUCTIONS) ×3 IMPLANT

## 2022-03-10 NOTE — Op Note (Addendum)
03/09/2022 - 03/10/2022  10:53 AM  PATIENT:  [name]    PRE-OPERATIVE DIAGNOSIS:  Abscess Left Foot  POST-OPERATIVE DIAGNOSIS:  Same  PROCEDURE:  LEFT FOOT DEBRIDEMENT Placement of Penrose drain. Tissue sent for cultures.  SURGEON:  Newt Minion, MD  PHYSICIAN ASSISTANT:None ANESTHESIA:   General  PREOPERATIVE INDICATIONS:  MADOLIN TWADDLE is a  56 y.o. female with a diagnosis of Abscess Left Foot who failed conservative measures and elected for surgical management.    The risks benefits and alternatives were discussed with the patient preoperatively including but not limited to the risks of infection, bleeding, nerve injury, cardiopulmonary complications, the need for revision surgery, among others, and the patient was willing to proceed.  OPERATIVE IMPLANTS: Penrose drain  '@ENCIMAGES'$ @  OPERATIVE FINDINGS: Localized abscess.  Tissue sent for cultures.  Would discharge on oral antibiotics for 2 weeks.  OPERATIVE PROCEDURE: Patient was brought the operating room and underwent a general anesthetic.  After adequate levels anesthesia were obtained patient's left lower extremity was prepped using DuraPrep draped into a sterile field a timeout was called.  Elliptical incision was made around the ulcerative tissue in the first webspace.  The necrotic tissue was sent for cultures.  The area was further debridement multiple compartments were entered no further evidence of abscess.  The wound was irrigated with normal saline electrocautery was used for hemostasis.  The wound was loosely closed with 2-0 nylon and a Penrose drain was placed.  A sterile dressing was applied.  Patient was extubated taken the PACU in stable condition.  Debridement type: Excisional Debridement  Side: left  Body Location: foot   Tools used for debridement: scalpel and rongeur  Pre-debridement Wound size (cm):   Length: 0        Width: 0     Depth: 0   Post-debridement Wound size (cm):   Length: 7         Width: 2     Depth: 2   Debridement depth beyond dead/damaged tissue down to healthy viable tissue: yes  Tissue layer involved: skin, subcutaneous tissue, muscle / fascia  Nature of tissue removed: Slough, Nutritional therapist, Non-viable tissue, and Purulence  Irrigation volume: 1 liter     Irrigation fluid type: Normal Saline     DISCHARGE PLANNING:  Antibiotic duration: Continue IV antibiotics adjust according to culture sensitivities  Weightbearing: Nonweightbearing on the left  Pain medication: Opioid pathway  Dressing care/ Wound VAC: Dry dressing reinforce as needed  Ambulatory devices: Walker or crutches  Discharge to: Anticipate discharge to home on oral antibiotics.  Follow-up: In the office 1 week post operative.

## 2022-03-10 NOTE — Progress Notes (Signed)
Orthopedic Tech Progress Note Patient Details:  April Cooper 1965-11-22 [id number]  Ortho Devices Type of Ortho Device: Postop shoe/boot Ortho Device/Splint Location: LLE Ortho Device/Splint Interventions: Ordered, Application, Adjustment   Post Interventions Patient Tolerated: Well Instructions Provided: Care of device, Adjustment of device  [name] 03/10/2022, 4:14 PM

## 2022-03-10 NOTE — Progress Notes (Signed)
PROGRESS NOTE April Cooper  ZES:[id number] DOB: [birth year] DOA: 03/09/2022 PCP: Lesleigh Noe, MD   Brief Narrative/Hospital Course: 56-yof who is independent, lives with family, medical history significant for type II DM, HTN, HLD, morbid obesity, presented to the [facility] ED on 03/09/2022 with Left foot/big toe area pain, swelling, redness and a large blister. 5 days PTA she noted itching in between her left foot first webspace.  By the next 48 hours, this had gotten significantly worse with painful swelling, some darkish looking spots, small blister and painful weightbearing.  On she was seen by her PCP and started on Keflex and Bactrim for suspected cellulitis from a possible insect bite.  2 to 3 days prior to symptom onset, she was in downtown [place] with her family dining but does not recollect any pain from a insect bite or sting.  She does not recollect stepping on any sharp objects and wears footwear all the time.The same evening on 5/30, the blister between the left foot first webspace got significantly larger and she presented to be ED who concurred with the regimen initiated by PCP and patient returned home.  Patient had a regular follow-up with her PCP and she also noted progressively worsened swelling, redness, enlarging blister which was darkish in color in the first webspace (pictures below) and was referred her to ED for possible IV antibiotics and further interventions. In ED-Afebrile with unremarkable vital signs.  CMP and CBC without abnormality except glucose of 131.  Blood cultures x2 have been drawn and pending.  X-ray of the left foot shows swelling of the first and second toes without subcutaneous air. EDP provided a dose of IV vancomycin and ceftriaxone, consulted orthopedics who recommend admission to [facility] for incision and drainage on 03/10/22    Subjective: Seen aexamined Patient underwent left foot debridement this morning by Dr. Sharol Given, si post  op, pain controlled. Able to move all toes on her left foot   Assessment and Plan: Principal Problem:   Cellulitis and abscess of foot Active Problems:   Controlled type 2 diabetes mellitus without complication, without long-term current use of insulin (HCC)   Hyperlipidemia associated with type 2 diabetes mellitus (HCC)   Class 3 severe obesity due to excess calories with serious comorbidity and body mass index (BMI) of 40.0 to 44.9 in adult [facility])   Essential hypertension   Diabetic foot infection ([place])   Cellulitis and abscess of Left Foot Diabetic foot infection: Underwent left foot debridement this morning by Dr. Sharol Given.  Continue vancomycin IV.  Follow-up surgical culture to guide further antibiotic therapy.  Continue pain control  Controlled type 2 diabetes mellitus without complication, without long-term current use of insulin: Blood sugar is stable continue on SSI for now holding metformin.  Ozempic has been discontinued for 3 weeks. Recent Labs  Lab 03/09/22 2203 03/10/22 0744 03/10/22 1020 03/10/22 1104 03/10/22 1204  GLUCAP 159* 160* 150* 140* 136*    Hyperlipidemia associated with type 2 diabetes mellitus: Continue statin  Essential hypertension: BP fairly controlled continue Cozaar   Morbid Obesity:Patient's Body mass index is 40.34 kg/m. : Will benefit with PCP follow-up, weight loss  healthy lifestyle.   DVT prophylaxis: enoxaparin (LOVENOX) injection 40 mg Start: 03/10/22 1000 Code Status:   Code Status: Full Code Family Communication: plan of care discussed with patient at bedside. Patient status is: Inpatient because of ongoing management of left foot abscess Level of care: Med-Surg   Dispo: The patient is from: HOME  Anticipated disposition: HOME in 2-3 days  Mobility Assessment (last 72 hours)     Mobility Assessment     Row Name 03/10/22 0800 03/09/22 2147         Does patient have an order for bedrest or is patient medically unstable  No - Continue assessment No - Continue assessment      What is the highest level of mobility based on the progressive mobility assessment? Level 6 (Walks independently in room and hall) - Balance while walking in room without assist - Complete Level 6 (Walks independently in room and hall) - Balance while walking in room without assist - Complete                Objective: Vitals last 24 hrs: Vitals:   03/10/22 1105 03/10/22 1120 03/10/22 1135 03/10/22 1159  BP: 136/80 (!) 142/79 (!) 161/79 (!) 156/69  Pulse: 98 72 61 61  Resp: '17 16 16 14  '$ Temp: 98 F (36.7 C)  98.5 F (36.9 C) 97.9 F (36.6 C)  TempSrc:      SpO2: 96% 95% 98% 98%  Weight:      Height:       Weight change:   Physical Examination: General exam: alert awake,older than stated age, weak appearing. HEENT:Oral mucosa moist, Ear/Nose WNL grossly, dentition normal. Respiratory system: bilaterally diminished BS, no use of accessory muscle Cardiovascular system: S1 & S2 +, No JVD. Gastrointestinal system: Abdomen soft,NT,ND, BS+ Nervous System:Alert, awake, moving extremities and grossly nonfocal Extremities: lt foot in dressing, with toes visible Skin: No rashes,no icterus. MSK: Normal muscle bulk,tone, power  Medications reviewed:  Scheduled Meds:  cholecalciferol  1,000 Units Oral Daily   enoxaparin (LOVENOX) injection  40 mg Subcutaneous Q24H   fentaNYL       insulin aspart  0-15 Units Subcutaneous TID WC   insulin aspart  0-5 Units Subcutaneous QHS   latanoprost  1 drop Both Eyes QHS   losartan  50 mg Oral Daily   rosuvastatin  5 mg Oral Daily   scopolamine  1 patch Transdermal Q72H   scopolamine       sodium chloride flush  3 mL Intravenous Q12H   Continuous Infusions:  sodium chloride     vancomycin        Diet Order             Diet Carb Modified Fluid consistency: Thin; Room service appropriate? Yes  Diet effective now                            Intake/Output Summary (Last 24  hours) at 03/10/2022 1306 Last data filed at 03/09/2022 2147 Gross per 24 hour  Intake 125 ml  Output --  Net 125 ml   Net IO Since Admission: 125 mL [03/10/22 1306]  Wt Readings from Last 3 Encounters:  03/10/22 106.6 kg  03/09/22 107.2 kg  03/07/22 106.9 kg     Unresulted Labs (From admission, onward)     Start     Ordered   03/16/22 0500  Creatinine, serum  (enoxaparin (LOVENOX)    CrCl >/= 30 ml/min)  Weekly,   R     Comments: while on enoxaparin therapy    03/10/22 0603   03/11/22 5956  Basic metabolic panel  Daily,   R      03/10/22 1112   03/11/22 0500  CBC  Daily,   R      03/10/22 1112  03/10/22 1041  Aerobic/Anaerobic Culture w Gram Stain (surgical/deep wound)  RELEASE UPON ORDERING,   TIMED       Comments: Specimen A: Pre-op diagnosis: Abscess Left Foot    03/10/22 1041          Data Reviewed: I have personally reviewed following labs and imaging studies CBC: Recent Labs  Lab 03/09/22 1409 03/10/22 0631  WBC 7.7 6.1  NEUTROABS 4.4  --   HGB 14.2 12.5  HCT 45.2 39.1  MCV 89.2 87.9  PLT 313 119   Basic Metabolic Panel: Recent Labs  Lab 03/09/22 1409 03/10/22 0631  NA 138 138  K 4.2 4.5  CL 107 111  CO2 24 21*  GLUCOSE 131* 161*  BUN 12 10  CREATININE 1.00 0.81  CALCIUM 8.9 8.6*   GFR: Estimated Creatinine Clearance: 91.3 mL/min (by C-G formula based on SCr of 0.81 mg/dL). Liver Function Tests: Recent Labs  Lab 03/09/22 1409  AST 15  ALT 21  ALKPHOS 68  BILITOT 0.5  PROT 7.7  ALBUMIN 3.9   No results for input(s): LIPASE, AMYLASE in the last 168 hours. No results for input(s): AMMONIA in the last 168 hours. Coagulation Profile: Recent Labs  Lab 03/09/22 1409  INR 1.0   BNP (last 3 results) No results for input(s): PROBNP in the last 8760 hours. HbA1C: No results for input(s): HGBA1C in the last 72 hours. CBG: Recent Labs  Lab 03/09/22 2203 03/10/22 0744 03/10/22 1020 03/10/22 1104 03/10/22 1204  GLUCAP 159* 160* 150*  140* 136*   Lipid Profile: No results for input(s): CHOL, HDL, LDLCALC, TRIG, CHOLHDL, LDLDIRECT in the last 72 hours. Thyroid Function Tests: No results for input(s): TSH, T4TOTAL, FREET4, T3FREE, THYROIDAB in the last 72 hours. Sepsis Labs: Recent Labs  Lab 03/09/22 1409 03/09/22 1716  LATICACIDVEN 1.3 1.8    Recent Results (from the past 240 hour(s))  Culture, blood (routine x 2)     Status: None (Preliminary result)   Collection Time: 03/09/22  2:09 PM   Specimen: BLOOD  Result Value Ref Range Status   Specimen Description   Final    BLOOD RIGHT ANTECUBITAL Performed at [facility] [address], [place], [place] [address]    Special Requests   Final    BOTTLES DRAWN AEROBIC AND ANAEROBIC Blood Culture results may not be optimal due to an excessive volume of blood received in culture bottles Performed at [facility] [address], [place], [place] [address]    Culture   Final    NO GROWTH < 24 HOURS Performed at [facility] [address], [place], [place] [address]    Report Status PENDING  Incomplete  Culture, blood (routine x 2)     Status: None (Preliminary result)   Collection Time: 03/09/22  2:22 PM   Specimen: BLOOD  Result Value Ref Range Status   Specimen Description   Final    BLOOD LEFT ANTECUBITAL Performed at [facility] [address], [place], [place] [address]    Special Requests   Final    BOTTLES DRAWN AEROBIC AND ANAEROBIC Blood Culture adequate volume Performed at [facility] [address] [place], [place] [address]    Culture   Final    NO GROWTH < 24 HOURS Performed at [facility] [address] [place], [place] [address]    Report Status PENDING  Incomplete  Surgical pcr screen  Status: None   Collection Time: 03/10/22  4:09 AM   Specimen: Nasal Mucosa; Nasal Swab  Result Value Ref Range Status   MRSA, PCR NEGATIVE NEGATIVE Final    Staphylococcus aureus NEGATIVE NEGATIVE Final    Comment: (NOTE) The Xpert SA Assay (FDA approved for NASAL specimens in patients 71 years of age and older), is one component of a comprehensive surveillance program. It is not intended to diagnose infection nor to guide or monitor treatment. Performed at [facility], [place] [address] [place], [place] [address]     Antimicrobials: Anti-infectives (From admission, onward)    Start     Dose/Rate Route Frequency Ordered Stop   03/10/22 1400  vancomycin (VANCOREADY) IVPB 1250 mg/250 mL        1,250 mg 166.7 mL/hr over 90 Minutes Intravenous Every 24 hours 03/09/22 1706     03/10/22 0903  ceFAZolin (ANCEF) 2-4 GM/100ML-% IVPB  Status:  Discontinued       Note to Pharmacy: Granville [place], Lindsi R: cabinet override      03/10/22 0903 03/10/22 0918   03/10/22 0645  ceFAZolin (ANCEF) IVPB 2g/100 mL premix  Status:  Discontinued        2 g 200 mL/hr over 30 Minutes Intravenous On call to O.R. 03/10/22 0559 03/10/22 1150   03/09/22 1415  cefTRIAXone (ROCEPHIN) 2 g in sodium chloride 0.9 % 100 mL IVPB        2 g 200 mL/hr over 30 Minutes Intravenous  Once 03/09/22 1402 03/09/22 1455   03/09/22 1415  vancomycin (VANCOREADY) IVPB 2000 mg/400 mL        2,000 mg 200 mL/hr over 120 Minutes Intravenous  Once 03/09/22 1406 03/09/22 1657      Culture/Microbiology    Component Value Date/Time   SDES  03/09/2022 1422    BLOOD LEFT ANTECUBITAL Performed at [facility], [place] [address], [place], [place] [address]    SPECREQUEST  03/09/2022 1422    BOTTLES DRAWN AEROBIC AND ANAEROBIC Blood Culture adequate volume Performed at [facility] [address], [place], [place] [address]    CULT  03/09/2022 1422    NO GROWTH < 24 HOURS Performed at [facility] [address], [place], [place] [address]    REPTSTATUS PENDING 03/09/2022 1422  Radiology Studies: DG Foot Complete Left  Result Date:  03/09/2022 CLINICAL DATA:  Soft tissue wound.  Bite on left great toe. EXAM: LEFT FOOT - COMPLETE 3+ VIEW COMPARISON:  None Available. FINDINGS: Moderate great toe soft tissue swelling. Likely additional soft tissue swelling of the proximal aspect of the second toe. Mild great toe metatarsophalangeal joint space narrowing with mild-to-moderate peripheral lateral greater than medial and dorsal degenerative osteophytes. Small plantar calcaneal heel spur. No acute fracture is seen. No dislocation. IMPRESSION: 1. Mild great toe metatarsophalangeal joint osteoarthritis. 2. Swelling of the first and second toes. No subcutaneous air is seen. Electronically Signed   By: Yvonne Kendall M.D.   On: 03/09/2022 14:13     LOS: 1 day   Antonieta Pert, MD [facility]  03/10/2022, 1:06 PM

## 2022-03-10 NOTE — Anesthesia Preprocedure Evaluation (Addendum)
Anesthesia Evaluation  Patient identified by MRN, date of birth, ID band Patient awake    Reviewed: Allergy & Precautions, NPO status , Patient's Chart, lab work & pertinent test results, reviewed documented beta blocker date and time   Airway Mallampati: I  TM Distance: >3 FB Neck ROM: Full    Dental no notable dental hx. (+) Teeth Intact, Dental Advisory Given   Pulmonary neg pulmonary ROS,    Pulmonary exam normal breath sounds clear to auscultation       Cardiovascular hypertension, Pt. on medications Normal cardiovascular exam Rhythm:Regular Rate:Normal     Neuro/Psych  Headaches, negative psych ROS   GI/Hepatic   Endo/Other  diabetes, Poorly Controlled, Type 2, Oral Hypoglycemic Agents, Insulin DependentHyperlipidemia  Renal/GU Renal diseaseHx/o renal calcui  negative genitourinary   Musculoskeletal Abscess left foot   Abdominal (+) + obese,   Peds  Hematology negative hematology ROS (+)   Anesthesia Other Findings   Reproductive/Obstetrics                            Anesthesia Physical Anesthesia Plan  ASA: 3  Anesthesia Plan: General   Post-op Pain Management: Minimal or no pain anticipated   Induction: Intravenous  PONV Risk Score and Plan: 4 or greater and Treatment may vary due to age or medical condition, Ondansetron and Midazolam  Airway Management Planned: LMA  Additional Equipment: None  Intra-op Plan:   Post-operative Plan: Extubation in OR  Informed Consent: I have reviewed the patients History and Physical, chart, labs and discussed the procedure including the risks, benefits and alternatives for the proposed anesthesia with the patient or authorized representative who has indicated his/her understanding and acceptance.     Dental advisory given  Plan Discussed with: Anesthesiologist and CRNA  Anesthesia Plan Comments:         Anesthesia Quick  Evaluation

## 2022-03-10 NOTE — Transfer of Care (Signed)
Immediate Anesthesia Transfer of Care Note  Patient: April Cooper  Procedure(s) Performed: LEFT FOOT DEBRIDEMENT (Left)  Patient Location: PACU  Anesthesia Type:General  Level of Consciousness: awake, alert  and oriented  Airway & Oxygen Therapy: Patient Spontanous Breathing and Patient connected to face mask oxygen  Post-op Assessment: Report given to RN and Post -op Vital signs reviewed and stable  Post vital signs: Reviewed and stable  Last Vitals:  Vitals Value Taken Time  BP 136/80 03/10/22 1102  Temp    Pulse 98 03/10/22 1104  Resp 17 03/10/22 1104  SpO2 94 % 03/10/22 1104  Vitals shown include unvalidated device data.  Last Pain:  Vitals:   03/10/22 0912  TempSrc:   PainSc: 0-No pain      Patients Stated Pain Goal: 0 (15/05/69 7948)  Complications: No notable events documented.

## 2022-03-10 NOTE — Consult Note (Signed)
ORTHOPAEDIC CONSULTATION  REQUESTING PHYSICIAN: Antonieta Pert, MD  Chief Complaint: Large abscess left great toe and second toe.  HPI: April Cooper is a 56 y.o. female who presents with abscess and blistering of the great toe and second toe left foot.  Patient states that this has been an acute problem and has gotten worse since she saw her primary care physician.  Past Medical History:  Diagnosis Date   AMA (advanced maternal age) multigravida 35+    Diabetes mellitus without complication ([place])    Ectopic pregnancy    Hyperlipidemia    Hyperlipidemia    Hyperlipidemia    Hypertension    Menometrorrhagia 01/2004, 12/2011   Nephrolithiasis    OBESITY    Penicillin allergy 11/04/2002   Vitamin D deficiency    Vulvovaginal candidiasis 07/2006   Past Surgical History:  Procedure Laterality Date   ABLATION  2013   CYSTOSCOPY WITH RETROGRADE PYELOGRAM, URETEROSCOPY AND STENT PLACEMENT Right 01/16/2013   Procedure: CYSTOSCOPY WITH RETROGRADE PYELOGRAM, URETEROSCOPY AND STENT PLACEMENT;  Surgeon: Dutch Gray, MD;  Location: [facility];  Service: Urology;  Laterality: Right;   HOLMIUM LASER APPLICATION Right 04/04/349   Procedure: HOLMIUM LASER APPLICATION;  Surgeon: Dutch Gray, MD;  Location: [facility];  Service: Urology;  Laterality: Right;   TUBAL LIGATION  2004   TUBAL LIGATION  2004   Social History   Socioeconomic History   Marital status: Married    Spouse name: Pilar Plate   Number of children: 3   Years of education: College   Highest education level: Not on file  Occupational History   Not on file  Tobacco Use   Smoking status: Never   Smokeless tobacco: Never  Vaping Use   Vaping Use: Never used  Substance and Sexual Activity   Alcohol use: Yes    Comment: twice monthly   Drug use: No   Sexual activity: Not Currently    Birth control/protection: Surgical  Other Topics Concern   Not on file  Social History Narrative   08/03/20   From: the area   Living: with  husband, Pilar Plate (2012) and 3 kids at home   Work: VP of property management division      Family: Theresia Lo (2009), [place], [employer] and then step son Elta Guadeloupe      Enjoys: family time      Exercise: walking on the treadmill (twice a week) and stairs at work   Diet: diabetic diet      Safety   Seat belts: Yes    Guns: Yes  and secure   Safe in relationships: Yes    Social Determinants of Radio broadcast assistant Strain: Not on file  Food Insecurity: Not on file  Transportation Needs: Not on file  Physical Activity: Not on file  Stress: Not on file  Social Connections: Not on file   Family History  Problem Relation Age of Onset   Hypertension Mother    Hypertension Father    Diabetes Sister    Diabetes Maternal Aunt    Hypertension Maternal Grandmother    Breast cancer Paternal Aunt 72   Breast cancer Paternal Grandmother    Colon cancer Neg Hx    - negative except otherwise stated in the family history section Allergies  Allergen Reactions   Penicillins Hives and Rash    Did it involve swelling of the face/tongue/throat, SOB, or low BP? No Did it involve sudden or severe rash/hives, skin peeling, or any reaction on the inside  of your mouth or nose? Yes Did you need to seek medical attention at a hospital or doctor's office? No When did it last happen?   childhood    If all above answers are "NO", may proceed with cephalosporin use.     Prior to Admission medications   Medication Sig Start Date End Date Taking? Authorizing Provider  cephALEXin (KEFLEX) 500 MG capsule Take 1 capsule (500 mg total) by mouth 2 (two) times daily for 10 days. 03/07/22 03/17/22 Yes Lesleigh Noe, MD  cholecalciferol (VITAMIN D3) 25 MCG (1000 UNIT) tablet Take 1,000 Units by mouth daily.   Yes [provider]  ibuprofen (ADVIL) 200 MG tablet Take 600 mg by mouth every 6 (six) hours as needed for moderate pain.   Yes [provider]  latanoprost (XALATAN) 0.005 % ophthalmic  solution Place 1 drop into both eyes at bedtime. 02/21/22  Yes [provider]  losartan (COZAAR) 50 MG tablet Take 1 tablet (50 mg total) by mouth daily. 01/02/22  Yes Lesleigh Noe, MD  metFORMIN (GLUCOPHAGE) 1000 MG tablet Take 1 tablet (1,000 mg total) by mouth every evening. 07/19/21  Yes Lesleigh Noe, MD  rosuvastatin (CRESTOR) 5 MG tablet TAKE 1 TABLET (5 MG TOTAL) BY MOUTH DAILY. 08/23/21  Yes Lesleigh Noe, MD  sulfamethoxazole-trimethoprim (BACTRIM DS) 800-160 MG tablet Take 1 tablet by mouth 2 (two) times daily for 10 days. 03/07/22 03/17/22 Yes Lesleigh Noe, MD  Accu-Chek FastClix Lancets MISC 1 each by Does not apply route in the morning, at noon, and at bedtime. 01/02/22   Lesleigh Noe, MD  blood glucose meter kit and supplies Dispense based on patient and insurance preference. Use up to four times daily as directed. (FOR ICD-10 E10.9, E11.9). 09/11/19   Cirigliano, Mary K, DO  glucose blood (ACCU-CHEK GUIDE) test strip USE UP TO 4 TIMES A DAY AS DIRECTED 01/02/22   Lesleigh Noe, MD  Semaglutide, 1 MG/DOSE, (OZEMPIC, 1 MG/DOSE,) 4 MG/3ML SOPN Inject 1 mg into the skin once a week. Patient not taking: Reported on 03/07/2022 02/02/22   Lesleigh Noe, MD   DG Foot Complete Left  Result Date: 03/09/2022 CLINICAL DATA:  Soft tissue wound.  Bite on left great toe. EXAM: LEFT FOOT - COMPLETE 3+ VIEW COMPARISON:  None Available. FINDINGS: Moderate great toe soft tissue swelling. Likely additional soft tissue swelling of the proximal aspect of the second toe. Mild great toe metatarsophalangeal joint space narrowing with mild-to-moderate peripheral lateral greater than medial and dorsal degenerative osteophytes. Small plantar calcaneal heel spur. No acute fracture is seen. No dislocation. IMPRESSION: 1. Mild great toe metatarsophalangeal joint osteoarthritis. 2. Swelling of the first and second toes. No subcutaneous air is seen. Electronically Signed   By: Yvonne Kendall M.D.   On:  03/09/2022 14:13   - pertinent xrays, CT, MRI studies were reviewed and independently interpreted  Positive ROS: All other systems have been reviewed and were otherwise negative with the exception of those mentioned in the HPI and as above.  Physical Exam: General: Alert, no acute distress Psychiatric: Patient is competent for consent with normal mood and affect Lymphatic: No axillary or cervical lymphadenopathy Cardiovascular: No pedal edema Respiratory: No cyanosis, no use of accessory musculature GI: No organomegaly, abdomen is soft and non-tender    Images:  _0 @  Labs:  Lab Results  Component Value Date   HGBA1C 7.0 (A) 01/02/2022   HGBA1C 6.9 (H) 07/19/2021   HGBA1C 7.0 (A)  03/08/2021   ESRSEDRATE 16 03/09/2022   CRP 0.9 03/09/2022   LABORGA NO GROWTH 04/08/2012    Lab Results  Component Value Date   ALBUMIN 3.9 03/09/2022   ALBUMIN 4.2 07/19/2021   ALBUMIN 4.1 11/12/2019        Latest Ref Rng & Units 03/10/2022    6:31 AM 03/09/2022    2:09 PM 09/10/2019    8:16 AM  CBC EXTENDED  WBC 4.0 - 10.5 K/uL 6.1   7.7   7.3    RBC 3.87 - 5.11 MIL/uL 4.45   5.07   5.09    Hemoglobin 12.0 - 15.0 g/dL 12.5   14.2   14.4    HCT 36.0 - 46.0 % 39.1   45.2   44.6    Platelets 150 - 400 K/uL 275   313   315.0    NEUT# 1.7 - 7.7 K/uL  4.4   3.9    Lymph# 0.7 - 4.0 K/uL  2.1   2.5      Neurologic: Patient does not have protective sensation bilateral lower extremities.   MUSCULOSKELETAL:   Skin: Examination patient has blistering ulceration in the first webspace with blistering involving the great toe and second toe with cellulitis involving both toes as well.  Patient has a strong dorsalis pedis pulse.  White blood cell count 6.1 hemoglobin 12.5.  Hemoglobin A1c most recent 7.0.  Radiographs show no subcutaneous air no destructive bony changes.  Assessment: Abscess and cellulitis left foot great toe and second toe with diabetic insensate  neuropathy.  Plan: We will plan for excisional debridement of the abscess.  Discussed with the patient we may require amputation of the great toe and second toe.  Risks and benefits were discussed including persistent infection nonhealing the wound need for additional surgery.  Patient states she understands wished to proceed at this time.  Thank you for the consult and the opportunity to see Ms. Vickii Penna, MD [facility] [phone] 8:32 AM

## 2022-03-10 NOTE — Progress Notes (Addendum)
Inpatient Diabetes Program Recommendations  AACE/ADA: New Consensus Statement on Inpatient Glycemic Control (2015)  Target Ranges:  Prepandial:   less than 140 mg/dL      Peak postprandial:   less than 180 mg/dL (1-2 hours)      Critically ill patients:  140 - 180 mg/dL   Lab Results  Component Value Date   GLUCAP 136 (H) 03/10/2022   HGBA1C 7.0 (A) 01/02/2022    Review of Glycemic Control  Diabetes history: type 2 Outpatient Diabetes medications: Metformin 1000 mg daily Current orders for Inpatient glycemic control: Novolog 0-15 units TID correction scale, Novolog 0-5 units scale at HS.  Inpatient Diabetes Program Recommendations:   Spoke with patient about her diabetes. Was diagnosed in 2021 and was started on Metformin 1000 mg daily. She has recently tried to take Ozempic 1 mg weekly, but it causes nausea and vomiting. It has been stopped. She also tried [facility] and it caused the same symptoms. HgbA1C was 7% in March, 2023. She is trying to keep her diabetes under control. States that she does not check blood sugars every day. Will follow up with her PCP after discharge.  Harvel Ricks RN BSN CDE Diabetes Coordinator Pager: [phone]  8am-5pm

## 2022-03-10 NOTE — Anesthesia Postprocedure Evaluation (Addendum)
Anesthesia Post Note  Patient: [name]  Procedure(s) Performed: LEFT FOOT DEBRIDEMENT (Left)     Patient location during evaluation: PACU Anesthesia Type: General Level of consciousness: awake and alert and oriented Pain management: pain level controlled Vital Signs Assessment: post-procedure vital signs reviewed and stable Respiratory status: spontaneous breathing, nonlabored ventilation and respiratory function stable Cardiovascular status: blood pressure returned to baseline and stable Postop Assessment: no apparent nausea or vomiting Anesthetic complications: no   No notable events documented.  Last Vitals:  Vitals:   03/10/22 1135 03/10/22 1159  BP: (!) 161/79 (!) 156/69  Pulse: 61 61  Resp: 16 14  Temp: 36.9 C 36.6 C  SpO2: 98% 98%    Last Pain:  Vitals:   03/10/22 1135  TempSrc:   PainSc: Asleep                 Maudell Stanbrough A.

## 2022-03-10 NOTE — Hospital Course (Addendum)
60-yof who is independent, lives with family, medical history significant for type II DM, HTN, HLD, morbid obesity, presented to the [facility] ED on 03/09/2022 with Left foot/big toe area pain, swelling, redness and a large blister. 5 days PTA she noted itching in between her left foot first webspace.  By the next 48 hours, this had gotten significantly worse with painful swelling, some darkish looking spots, small blister and painful weightbearing.  On she was seen by her PCP and started on Keflex and Bactrim for suspected cellulitis from a possible insect bite.  2 to 3 days prior to symptom onset, she was in downtown [place] with her family dining but does not recollect any pain from a insect bite or sting.  She does not recollect stepping on any sharp objects and wears footwear all the time.The same evening on 5/30, the blister between the left foot first webspace got significantly larger and she presented to be ED who concurred with the regimen initiated by PCP and patient returned home.  Patient had a regular follow-up with her PCP and she also noted progressively worsened swelling, redness, enlarging blister which was darkish in color in the first webspace (pictures below) and was referred her to ED for possible IV antibiotics and further interventions. In ED-Afebrile with unremarkable vital signs.  CMP and CBC without abnormality except glucose of 131.  Blood cultures x2 have been drawn and pending.  X-ray of the left foot shows swelling of the first and second toes without subcutaneous air. EDP provided a dose of IV vancomycin and ceftriaxone, consulted orthopedics who recommend admission to [facility] for incision and drainage on 03/10/22..  Managing vancomycin cefepime, pharmacy adjusting dose, podiatry following.  Pain is controlled overall patient is much improved, wound culture with staph epidermidis pansensitive.  Once cleared by podiatry she will be discharged home on oral  antibiotics with instruction for outpatient follow-up

## 2022-03-10 NOTE — Anesthesia Procedure Notes (Signed)
Procedure Name: LMA Insertion Date/Time: 03/10/2022 10:30 AM Performed by: Griffin Dakin, CRNA Pre-anesthesia Checklist: Patient identified, Emergency Drugs available, Suction available and Patient being monitored Patient Re-evaluated:Patient Re-evaluated prior to induction Oxygen Delivery Method: Circle system utilized Preoxygenation: Pre-oxygenation with 100% oxygen Induction Type: IV induction Ventilation: Mask ventilation without difficulty LMA: LMA inserted LMA Size: 4.0 Tube type: Oral Number of attempts: 1 Airway Equipment and Method: Stylet and Oral airway Placement Confirmation: positive ETCO2 and breath sounds checked- equal and bilateral Tube secured with: Tape Dental Injury: Teeth and Oropharynx as per pre-operative assessment

## 2022-03-11 ENCOUNTER — Encounter (HOSPITAL_COMMUNITY): Payer: Self-pay | Admitting: Orthopedic Surgery

## 2022-03-11 LAB — BASIC METABOLIC PANEL
Anion gap: 7 (ref 5–15)
BUN: 14 mg/dL (ref 6–20)
CO2: 22 mmol/L (ref 22–32)
Calcium: 9.4 mg/dL (ref 8.9–10.3)
Chloride: 107 mmol/L (ref 98–111)
Creatinine, Ser: 0.81 mg/dL (ref 0.44–1.00)
GFR, Estimated: >60 mL/min (ref >60)
Glucose, Bld: 186 mg/dL — ABNORMAL HIGH (ref 70–99)
Potassium: 4.5 mmol/L (ref 3.5–5.1)
Sodium: 136 mmol/L (ref 135–145)

## 2022-03-11 LAB — CBC
HCT: 40.5 % (ref 36.0–46.0)
Hemoglobin: 13.3 g/dL (ref 12.0–15.0)
MCH: 28.7 pg (ref 26.0–34.0)
MCHC: 32.8 g/dL (ref 30.0–36.0)
MCV: 87.3 fL (ref 80.0–100.0)
Platelets: 310 10*3/uL (ref 150–400)
RBC: 4.64 MIL/uL (ref 3.87–5.11)
RDW: 13.8 % (ref 11.5–15.5)
WBC: 8.6 10*3/uL (ref 4.0–10.5)
nRBC: 0 % (ref 0.0–0.2)

## 2022-03-11 LAB — GLUCOSE, CAPILLARY
Glucose-Capillary: 168 mg/dL — ABNORMAL HIGH (ref 70–99)
Glucose-Capillary: 154 mg/dL — ABNORMAL HIGH (ref 70–99)
Glucose-Capillary: 239 mg/dL — ABNORMAL HIGH (ref 70–99)
Glucose-Capillary: 186 mg/dL — ABNORMAL HIGH (ref 70–99)

## 2022-03-11 MED ORDER — SODIUM CHLORIDE 0.9 % IV SOLN
2.0000 g | Freq: Three times a day (TID) | INTRAVENOUS | Status: DC
  Administered 2022-03-11 – 2022-03-13 (×7): 2 g via INTRAVENOUS
  Filled 2022-03-11 (×8): qty 12.5

## 2022-03-11 NOTE — Progress Notes (Signed)
Pharmacy Antibiotic Note  April Cooper is a 56 y.o. female admitted on 03/09/2022 with  wound infection of left foot [big toe area] s/p debridement with Dr. Sharol Given awaiting culture results  .  Pharmacy has been consulted for cefepime dosing.  Plan: Cefepime 2 grams iv q8h  Height: '5\' 4"'$  (162.6 cm) Weight: 106.6 kg (235 lb) IBW/kg (Calculated) : 54.7  Temp (24hrs), Avg:98.1 F (36.7 C), Min:97.8 F (36.6 C), Max:98.5 F (36.9 C)  Recent Labs  Lab 03/09/22 1409 03/09/22 1716 03/10/22 0631 03/11/22 0211  WBC 7.7  --  6.1 8.6  CREATININE 1.00  --  0.81 0.81  LATICACIDVEN 1.3 1.8  --   --     Estimated Creatinine Clearance: 91.3 mL/min (by C-G formula based on SCr of 0.81 mg/dL).    Allergies  Allergen Reactions   Penicillins Hives and Rash    Did it involve swelling of the face/tongue/throat, SOB, or low BP? No Did it involve sudden or severe rash/hives, skin peeling, or any reaction on the inside of your mouth or nose? Yes Did you need to seek medical attention at a hospital or doctor's office? No When did it last happen?   childhood    If all above answers are "NO", may proceed with cephalosporin use.      Antimicrobials this admission: 6/1 vanc >>  6/1 CTX >> 6/1 6/2 Cefepime >>   Microbiology results: 6/1 BCx: ngtd [D3] 6/1 abscess from wound [content] No WBC / No organisms seen on Gram's Stain 6/2 MRSA PCR: negative  Thank you for allowing pharmacy to be a part of this patient's care.  Vaughan Basta BS, PharmD, BCPS Clinical Pharmacist 03/11/2022 7:33 AM  Contact: [phone] after 3 PM  "Be curious, not judgmental..." -Jamal Maes

## 2022-03-11 NOTE — Progress Notes (Signed)
Patient ID: April Cooper, female   DOB: [birth year], 56 y.o.   MRN: [id number] Patient is postoperative day 1 debridement abscess left foot.  Cultures are pending.  I will plan to have the dressing changed Monday and if cultures are still negative could discharge on oral doxycycline Monday.

## 2022-03-11 NOTE — Progress Notes (Signed)
PROGRESS NOTE April Cooper  NLG:[id number] DOB: 04-Dec-1965 DOA: 03/09/2022 PCP: Lesleigh Noe, MD   Brief Narrative/Hospital Course: 56-yof who is independent, lives with family, medical history significant for type II DM, HTN, HLD, morbid obesity, presented to the [facility] ED on 03/09/2022 with Left foot/big toe area pain, swelling, redness and a large blister. 5 days PTA she noted itching in between her left foot first webspace.  By the next 48 hours, this had gotten significantly worse with painful swelling, some darkish looking spots, small blister and painful weightbearing.  On she was seen by her PCP and started on Keflex and Bactrim for suspected cellulitis from a possible insect bite.  2 to 3 days prior to symptom onset, she was in downtown [place] with her family dining but does not recollect any pain from a insect bite or sting.  She does not recollect stepping on any sharp objects and wears footwear all the time.The same evening on 5/30, the blister between the left foot first webspace got significantly larger and she presented to be ED who concurred with the regimen initiated by PCP and patient returned home.  Patient had a regular follow-up with her PCP and she also noted progressively worsened swelling, redness, enlarging blister which was darkish in color in the first webspace (pictures below) and was referred her to ED for possible IV antibiotics and further interventions. In ED-Afebrile with unremarkable vital signs.  CMP and CBC without abnormality except glucose of 131.  Blood cultures x2 have been drawn and pending.  X-ray of the left foot shows swelling of the first and second toes without subcutaneous air. EDP provided a dose of IV vancomycin and ceftriaxone, consulted orthopedics who recommend admission to [facility] for incision and drainage on 03/10/22    Subjective: Seen and examined this morning.  Resting comfortably in the bedside chair Overnight  patient's remains afebrile BP in 120s to 140s, doing well on room air   Assessment and Plan: Principal Problem:   Cellulitis and abscess of foot Active Problems:   Controlled type 2 diabetes mellitus without complication, without long-term current use of insulin (HCC)   Hyperlipidemia associated with type 2 diabetes mellitus (HCC)   Class 3 severe obesity due to excess calories with serious comorbidity and body mass index (BMI) of 40.0 to 44.9 in adult [facility])   Essential hypertension   Diabetic foot infection ([place])   Cellulitis and abscess of Left Foot Diabetic foot infection: Underwent left foot debridement 6/2-Dr. Sharol Given.  Continue vancomycin IV, add cefepime given diabetes pending further culture data.  Surgical culture pending.  Continue pain control, continue wound care per orthopedics-planning for dressing change on Monday and and likely discharge  Type 2 diabetes mellitus with uncontrolled hyperglycemia w/o long-term use of insulin Blood sugar  140-276-Continue on SSI for now holding metformin.  Ozempic has been discontinued for 3 weeks.  Consider long-acting insulin if persistently  >180S Recent Labs  Lab 03/10/22 1104 03/10/22 1204 03/10/22 1652 03/10/22 2116 03/11/22 0757  GLUCAP 140* 136* 276* 251* 168*    Hyperlipidemia associated with type 2 diabetes mellitus: On statin  Essential hypertension: BP well controlled on Cozaar   Morbid Obesity:Patient's Body mass index is 40.34 kg/m. : Will benefit with PCP follow-up, weight loss  healthy lifestyle.  DVT prophylaxis:   Lovenox Code Status:   Code Status: Full Code Family Communication: plan of care discussed with patient at bedside. Patient status is: Inpatient because of ongoing management of left  foot abscess Level of care: Med-Surg   Dispo: The patient is from: HOME            Anticipated disposition: HOME Monday  Mobility Assessment (last 72 hours)     Mobility Assessment     Row Name 03/11/22 0926 03/10/22  1935 03/10/22 0800 03/09/22 2147     Does patient have an order for bedrest or is patient medically unstable -- No - Continue assessment No - Continue assessment No - Continue assessment    What is the highest level of mobility based on the progressive mobility assessment? Level 5 (Walks with assist in room/hall) - Balance while stepping forward/back and can walk in room with assist - Complete Level 6 (Walks independently in room and hall) - Balance while walking in room without assist - Complete Level 6 (Walks independently in room and hall) - Balance while walking in room without assist - Complete Level 6 (Walks independently in room and hall) - Balance while walking in room without assist - Complete              Objective: Vitals last 24 hrs: Vitals:   03/10/22 2125 03/10/22 2353 03/11/22 0429 03/11/22 0755  BP: (!) 152/71 126/63 (!) 142/65 (!) 148/70  Pulse: 68 63 (!) 55 (!) 55  Resp: '17 18 18 18  '$ Temp: 98.3 F (36.8 C) 97.8 F (36.6 C) 98 F (36.7 C) (!) 97.5 F (36.4 C)  TempSrc: Oral Oral Oral Oral  SpO2: 98% 100% 100% 100%  Weight:      Height:       Weight change: -1.405 kg  Physical Examination: General exam: AAOX3, pleasant  HEENT:Oral mucosa moist, Ear/Nose WNL grossly, dentition normal. Respiratory system: bilaterally diminished, no use of accessory muscle Cardiovascular system: S1 & S2 +, No JVD,. Gastrointestinal system: Abdomen soft,NT,ND,BS+ Nervous System:Alert, awake, moving extremities and grossly nonfocal Extremities: Left foot with dressing in place deep space able sensation intact  Skin: No rashes,no icterus. MSK: Normal muscle bulk,tone, power   Medications reviewed:  Scheduled Meds:  cholecalciferol  1,000 Units Oral Daily   docusate sodium  100 mg Oral BID   enoxaparin (LOVENOX) injection  50 mg Subcutaneous Q24H   insulin aspart  0-15 Units Subcutaneous TID WC   insulin aspart  0-5 Units Subcutaneous QHS   latanoprost  1 drop Both Eyes QHS    losartan  50 mg Oral Daily   rosuvastatin  5 mg Oral Daily   scopolamine  1 patch Transdermal Q72H   sodium chloride flush  3 mL Intravenous Q12H   Continuous Infusions:  sodium chloride     sodium chloride 10 mL/hr at 03/10/22 1449   ceFEPime (MAXIPIME) IV 2 g (03/11/22 0823)   methocarbamol (ROBAXIN) IV     vancomycin 1,250 mg (03/10/22 1448)      Diet Order             Diet Carb Modified Fluid consistency: Thin; Room service appropriate? Yes  Diet effective now                            Intake/Output Summary (Last 24 hours) at 03/11/2022 1033 Last data filed at 03/10/2022 2201 Gross per 24 hour  Intake 490 ml  Output --  Net 490 ml   Net IO Since Admission: 615 mL [03/11/22 1033]  Wt Readings from Last 3 Encounters:  03/10/22 106.6 kg  03/09/22 107.2 kg  03/07/22 106.9 kg  Unresulted Labs (From admission, onward)     Start     Ordered   03/16/22 0500  Creatinine, serum  (enoxaparin (LOVENOX)    CrCl >/= 30 ml/min)  Weekly,   R     Comments: while on enoxaparin therapy    03/10/22 0603   03/11/22 1275  Basic metabolic panel  Daily,   R      03/10/22 1112   03/11/22 0500  CBC  Daily,   R      03/10/22 1112          Data Reviewed: I have personally reviewed following labs and imaging studies CBC: Recent Labs  Lab 03/09/22 1409 03/10/22 0631 03/11/22 0211  WBC 7.7 6.1 8.6  NEUTROABS 4.4  --   --   HGB 14.2 12.5 13.3  HCT 45.2 39.1 40.5  MCV 89.2 87.9 87.3  PLT 313 275 170   Basic Metabolic Panel: Recent Labs  Lab 03/09/22 1409 03/10/22 0631 03/11/22 0211  NA 138 138 136  K 4.2 4.5 4.5  CL 107 111 107  CO2 24 21* 22  GLUCOSE 131* 161* 186*  BUN '12 10 14  '$ CREATININE 1.00 0.81 0.81  CALCIUM 8.9 8.6* 9.4   GFR: Estimated Creatinine Clearance: 91.3 mL/min (by C-G formula based on SCr of 0.81 mg/dL). Liver Function Tests: Recent Labs  Lab 03/09/22 1409  AST 15  ALT 21  ALKPHOS 68  BILITOT 0.5  PROT 7.7  ALBUMIN 3.9   No  results for input(s): LIPASE, AMYLASE in the last 168 hours. No results for input(s): AMMONIA in the last 168 hours. Coagulation Profile: Recent Labs  Lab 03/09/22 1409  INR 1.0   BNP (last 3 results) No results for input(s): PROBNP in the last 8760 hours. HbA1C: No results for input(s): HGBA1C in the last 72 hours. CBG: Recent Labs  Lab 03/10/22 1104 03/10/22 1204 03/10/22 1652 03/10/22 2116 03/11/22 0757  GLUCAP 140* 136* 276* 251* 168*   Lipid Profile: No results for input(s): CHOL, HDL, LDLCALC, TRIG, CHOLHDL, LDLDIRECT in the last 72 hours. Thyroid Function Tests: No results for input(s): TSH, T4TOTAL, FREET4, T3FREE, THYROIDAB in the last 72 hours. Sepsis Labs: Recent Labs  Lab 03/09/22 1409 03/09/22 1716  LATICACIDVEN 1.3 1.8    Recent Results (from the past 240 hour(s))  Culture, blood (routine x 2)     Status: None (Preliminary result)   Collection Time: 03/09/22  2:09 PM   Specimen: BLOOD  Result Value Ref Range Status   Specimen Description   Final    BLOOD RIGHT ANTECUBITAL Performed at [facility] [address] [place], [place] [address]    Special Requests   Final    BOTTLES DRAWN AEROBIC AND ANAEROBIC Blood Culture results may not be optimal due to an excessive volume of blood received in culture bottles Performed at [facility] [address], [place], [place] [address]    Culture   Final    NO GROWTH 2 DAYS Performed at [facility] [address] [place], [place] [address]    Report Status PENDING  Incomplete  Culture, blood (routine x 2)     Status: None (Preliminary result)   Collection Time: 03/09/22  2:22 PM   Specimen: BLOOD  Result Value Ref Range Status   Specimen Description   Final    BLOOD LEFT ANTECUBITAL Performed at [facility] [address] [place], [place] [address]    Special Requests  Final    BOTTLES DRAWN AEROBIC AND ANAEROBIC Blood Culture  adequate volume Performed at [facility] [address] [place], [place] [address]    Culture   Final    NO GROWTH 2 DAYS Performed at [facility] [address] [place], [place] [address]    Report Status PENDING  Incomplete  Surgical pcr screen     Status: None   Collection Time: 03/10/22  4:09 AM   Specimen: Nasal Mucosa; Nasal Swab  Result Value Ref Range Status   MRSA, PCR NEGATIVE NEGATIVE Final   Staphylococcus aureus NEGATIVE NEGATIVE Final    Comment: (NOTE) The Xpert SA Assay (FDA approved for NASAL specimens in patients 68 years of age and older), is one component of a comprehensive surveillance program. It is not intended to diagnose infection nor to guide or monitor treatment. Performed at [facility], [place] [address], [place], [place] [address]   Aerobic/Anaerobic Culture w Gram Stain (surgical/deep wound)     Status: None (Preliminary result)   Collection Time: 03/10/22 10:41 AM   Specimen: Wound; Abscess  Result Value Ref Range Status   Specimen Description WOUND  Final   Special Requests LEFT FOOT GREAT TOE  Final   Gram Stain NO WBC SEEN NO ORGANISMS SEEN   Final   Culture   Final    CULTURE REINCUBATED FOR BETTER GROWTH Performed at [facility], [address], [place], [place] [address]    Report Status PENDING  Incomplete    Antimicrobials: Anti-infectives (From admission, onward)    Start     Dose/Rate Route Frequency Ordered Stop   03/11/22 0830  ceFEPIme (MAXIPIME) 2 g in sodium chloride 0.9 % 100 mL IVPB        2 g 200 mL/hr over 30 Minutes Intravenous Every 8 hours 03/11/22 0730     03/10/22 1400  vancomycin (VANCOREADY) IVPB 1250 mg/250 mL        1,250 mg 166.7 mL/hr over 90 Minutes Intravenous Every 24 hours 03/09/22 1706     03/10/22 0903  ceFAZolin (ANCEF) 2-4 GM/100ML-% IVPB  Status:  Discontinued       Note to Pharmacy: Granville Lewis, Lindsi R: cabinet override      03/10/22 0903 03/10/22 0918    03/10/22 0645  ceFAZolin (ANCEF) IVPB 2g/100 mL premix  Status:  Discontinued        2 g 200 mL/hr over 30 Minutes Intravenous On call to O.R. 03/10/22 0559 03/10/22 1150   03/09/22 1415  cefTRIAXone (ROCEPHIN) 2 g in sodium chloride 0.9 % 100 mL IVPB        2 g 200 mL/hr over 30 Minutes Intravenous  Once 03/09/22 1402 03/09/22 1455   03/09/22 1415  vancomycin (VANCOREADY) IVPB 2000 mg/400 mL        2,000 mg 200 mL/hr over 120 Minutes Intravenous  Once 03/09/22 1406 03/09/22 1657      Culture/Microbiology    Component Value Date/Time   SDES WOUND 03/10/2022 1041   SPECREQUEST LEFT FOOT GREAT TOE 03/10/2022 1041   CULT  03/10/2022 1041    CULTURE REINCUBATED FOR BETTER GROWTH Performed at [facility], [place] [address], [place], [place] [address]    REPTSTATUS PENDING 03/10/2022 1041  Radiology Studies: DG Foot Complete Left  Result Date: 03/09/2022 CLINICAL DATA:  Soft tissue wound.  Bite on left great toe. EXAM: LEFT FOOT - COMPLETE 3+ VIEW COMPARISON:  None Available. FINDINGS: Moderate great  toe soft tissue swelling. Likely additional soft tissue swelling of the proximal aspect of the second toe. Mild great toe metatarsophalangeal joint space narrowing with mild-to-moderate peripheral lateral greater than medial and dorsal degenerative osteophytes. Small plantar calcaneal heel spur. No acute fracture is seen. No dislocation. IMPRESSION: 1. Mild great toe metatarsophalangeal joint osteoarthritis. 2. Swelling of the first and second toes. No subcutaneous air is seen. Electronically Signed   By: Yvonne Kendall M.D.   On: 03/09/2022 14:13     LOS: 2 days   Antonieta Pert, MD [facility]  03/11/2022, 10:33 AM

## 2022-03-11 NOTE — Evaluation (Signed)
Physical Therapy Evaluation Patient Details Name: April Cooper MRN: [id number] DOB: Sep 25, 1966 Today's Date: 03/11/2022  History of Present Illness  56 yo F s/p surgery on left foot for infection/abscess.  PMH significant for but not limited to:DM, HTN  Clinical Impression  Patient is s/p above surgery resulting in functional limitations due to the deficits listed below (see PT Problem List).  Pt understands NWB but has difficulty maintaining it. Educated pt to limit waking due to this and especially keep all weight off of toe area.  Dr. Sharol Given is aware of this as he came in at start of session.  Pt will have assist at home and plans to stay on first floor of house. I have answered all patient's question regarding PT and mobility.    Instructed on keeping foot elevated.  Pt will need RW for safe mobility at DC.  Instructed pt to only get up in hospital with assist of nursing staff.  No further PT needs at this time so PT will sign off.            Recommendations for follow up therapy are one component of a multi-disciplinary discharge planning process, led by the attending physician.  Recommendations may be updated based on patient status, additional functional criteria and insurance authorization.  Follow Up Recommendations No PT follow up    Assistance Recommended at Discharge PRN  Patient can return home with the following       Equipment Recommendations Rolling walker (2 wheels)  Recommendations for Other Services       Functional Status Assessment Patient has had a recent decline in their functional status and demonstrates the ability to make significant improvements in function in a reasonable and predictable amount of time.     Precautions / Restrictions Precautions Required Braces or Orthoses: Other Brace (Post-op shoe on LLE) Restrictions Weight Bearing Restrictions: Yes LLE Weight Bearing: Non weight bearing      Mobility  Bed Mobility Overal bed mobility: Modified  Independent                  Transfers Overall transfer level: Needs assistance Equipment used: Rolling walker (2 wheels) Transfers: Sit to/from Stand Sit to Stand: Supervision           General transfer comment: Verbal cues and education for safest technique    Ambulation/Gait Ambulation/Gait assistance: Supervision Gait Distance (Feet): 30 Feet Assistive device: Rolling walker (2 wheels) Gait Pattern/deviations: Step-to pattern Gait velocity: decreased     General Gait Details: unable to fully maintain NWB on LLE  Stairs Stairs:  (Verbally discussed stairs with crutch and rail)          Wheelchair Mobility    Modified Rankin (Stroke Patients Only)       Balance Overall balance assessment: No apparent balance deficits (not formally assessed)                                           Pertinent Vitals/Pain Pain Assessment Pain Assessment:  (C/o soreness in left foot, especailly when moving)    Home Living Family/patient expects to be discharged to:: Private residence Living Arrangements: Children Available Help at Discharge: Family (daughter just graduated college and is not working yet) Type of Home: House Home Access: Level entry     Alternate Level Stairs-Number of Steps: flght Home Layout: Two level;Able to live on main level  with bedroom/bathroom Home Equipment: None      Prior Function Prior Level of Function : Independent/Modified Independent                     Hand Dominance   Dominant Hand: Right    Extremity/Trunk Assessment   Upper Extremity Assessment Upper Extremity Assessment: Overall WFL for tasks assessed    Lower Extremity Assessment Lower Extremity Assessment: LLE deficits/detail (Limited by bulky dressing on left foot and ankle)       Communication   Communication: No difficulties  Cognition Arousal/Alertness: Awake/alert Behavior During Therapy: WFL for tasks  assessed/performed Overall Cognitive Status: Within Functional Limits for tasks assessed                                          General Comments General comments (skin integrity, edema, etc.): No family present. Pt very receptive to new learning. self reports she has difficulty maintaining NWB    Exercises General Exercises - Lower Extremity Ankle Circles/Pumps: AROM, Left, 10 reps   Assessment/Plan    PT Assessment Patient does not need any further PT services  PT Problem List         PT Treatment Interventions      PT Goals (Current goals can be found in the Care Plan section)       Frequency       Co-evaluation               AM-PAC PT "6 Clicks" Mobility  Outcome Measure Help needed turning from your back to your side while in a flat bed without using bedrails?: None Help needed moving from lying on your back to sitting on the side of a flat bed without using bedrails?: None Help needed moving to and from a bed to a chair (including a wheelchair)?: A Little Help needed standing up from a chair using your arms (e.g., wheelchair or bedside chair)?: A Little Help needed to walk in hospital room?: A Little Help needed climbing 3-5 steps with a railing? : A Little 6 Click Score: 20    End of Session Equipment Utilized During Treatment: Gait belt Activity Tolerance: Patient tolerated treatment well Patient left: in chair;with call bell/phone within reach   PT Visit Diagnosis: Unsteadiness on feet (R26.81)    Time: 8280-0349 PT Time Calculation (min) (ACUTE ONLY): 31 min   Charges:   PT Evaluation $PT Eval Moderate Complexity: 1 Mod PT Treatments $Gait Training: 8-22 mins        Lavonia Dana, [place]  Office [phone] 03/11/2022   Melvern Banker 03/11/2022, 9:37 AM

## 2022-03-12 ENCOUNTER — Other Ambulatory Visit: Payer: Self-pay | Admitting: Physician Assistant

## 2022-03-12 LAB — BASIC METABOLIC PANEL
Anion gap: 9 (ref 5–15)
BUN: 19 mg/dL (ref 6–20)
CO2: 23 mmol/L (ref 22–32)
Calcium: 8.9 mg/dL (ref 8.9–10.3)
Chloride: 107 mmol/L (ref 98–111)
Creatinine, Ser: 0.8 mg/dL (ref 0.44–1.00)
GFR, Estimated: >60 mL/min (ref >60)
Glucose, Bld: 165 mg/dL — ABNORMAL HIGH (ref 70–99)
Potassium: 4.4 mmol/L (ref 3.5–5.1)
Sodium: 139 mmol/L (ref 135–145)

## 2022-03-12 LAB — GLUCOSE, CAPILLARY
Glucose-Capillary: 137 mg/dL — ABNORMAL HIGH (ref 70–99)
Glucose-Capillary: 163 mg/dL — ABNORMAL HIGH (ref 70–99)
Glucose-Capillary: 206 mg/dL — ABNORMAL HIGH (ref 70–99)
Glucose-Capillary: 116 mg/dL — ABNORMAL HIGH (ref 70–99)

## 2022-03-12 NOTE — Progress Notes (Signed)
Subjective: 2 Days Post-Op Procedure(s) (LRB): LEFT FOOT DEBRIDEMENT (Left) Patient reports pain as mild.    Objective: Vital signs in last 24 hours: Temp:  [97.7 F (36.5 C)-98.2 F (36.8 C)] 97.9 F (36.6 C) (06/04 0752) Pulse Rate:  [51-61] 52 (06/04 0752) Resp:  [18] 18 (06/04 0752) BP: (112-125)/(50-71) 125/71 (06/04 0752) SpO2:  [96 %-100 %] 100 % (06/04 0752)  Intake/Output from previous day: 06/03 0701 - 06/04 0700 In: 1130 [P.O.:480; IV Piggyback:650] Out: -  Intake/Output this shift: No intake/output data recorded.  Recent Labs    03/09/22 1409 03/10/22 0631 03/11/22 0211  HGB 14.2 12.5 13.3   Recent Labs    03/10/22 0631 03/11/22 0211  WBC 6.1 8.6  RBC 4.45 4.64  HCT 39.1 40.5  PLT 275 310   Recent Labs    03/11/22 0211 03/12/22 0101  NA 136 139  K 4.5 4.4  CL 107 107  CO2 22 23  BUN 14 19  CREATININE 0.81 0.80  GLUCOSE 186* 165*  CALCIUM 9.4 8.9   Recent Labs    03/09/22 1409  INR 1.0    Neurologically intact Sensation intact distally Intact pulses distally Dorsiflexion/Plantar flexion intact Incision: dressing C/D/I Compartment soft Able to wiggle toes  Assessment/Plan: 2 Days Post-Op Procedure(s) (LRB): LEFT FOOT DEBRIDEMENT (Left) PLAN NWB LLE Cx growing staph epidermidis-currently on vancomycin      April Cooper 03/12/2022, 10:30 AM

## 2022-03-12 NOTE — Progress Notes (Signed)
PROGRESS NOTE April Cooper  LFY:[id number] DOB: [birth year] DOA: 03/09/2022 PCP: Lesleigh Noe, MD   Brief Narrative/Hospital Course: 56-yof who is independent, lives with family, medical history significant for type II DM, HTN, HLD, morbid obesity, presented to the [facility] ED on 03/09/2022 with Left foot/big toe area pain, swelling, redness and a large blister. 5 days PTA she noted itching in between her left foot first webspace.  By the next 48 hours, this had gotten significantly worse with painful swelling, some darkish looking spots, small blister and painful weightbearing.  On she was seen by her PCP and started on Keflex and Bactrim for suspected cellulitis from a possible insect bite.  2 to 3 days prior to symptom onset, she was in downtown [place] with her family dining but does not recollect any pain from a insect bite or sting.  She does not recollect stepping on any sharp objects and wears footwear all the time.The same evening on 5/30, the blister between the left foot first webspace got significantly larger and she presented to be ED who concurred with the regimen initiated by PCP and patient returned home.  Patient had a regular follow-up with her PCP and she also noted progressively worsened swelling, redness, enlarging blister which was darkish in color in the first webspace (pictures below) and was referred her to ED for possible IV antibiotics and further interventions. In ED-Afebrile with unremarkable vital signs.  CMP and CBC without abnormality except glucose of 131.  Blood cultures x2 have been drawn and pending.  X-ray of the left foot shows swelling of the first and second toes without subcutaneous air. EDP provided a dose of IV vancomycin and ceftriaxone, consulted orthopedics who recommend admission to [facility] for incision and drainage on 03/10/22..  Managing vancomycin cefepime, pharmacy adjusting dose, podiatry following    Subjective: Seen and  examined this morning.  Resting comfortably no complaint, she reports that she is controlling pain with Tylenol. Overnight remains afebrile.   Assessment and Plan: Principal Problem:   Cellulitis and abscess of foot Active Problems:   Controlled type 2 diabetes mellitus without complication, without long-term current use of insulin (HCC)   Hyperlipidemia associated with type 2 diabetes mellitus ([place])   Class 3 severe obesity due to excess calories with serious comorbidity and body mass index (BMI) of 40.0 to 44.9 in adult [facility])   Essential hypertension   Diabetic foot infection ([place])   Cellulitis and abscess of Left Foot Diabetic foot infection: S/p left foot debridement 6/2-Dr. Sharol Given.Continue vancomycin IV, cefepime given diabetes pending further culture data.  Few Staph epidermidis, consider incubated.  Continue wound care, mobilize PT OT pain control.  Anticipate discharge Monday.   Type 2 diabetes mellitus with uncontrolled hyperglycemia w/o long-term use of insulin Blood sugar now overall stable continues SSI, continue continue holding metformin.  Ozempic has been discontinued for 3 weeks.  Consider long-acting insulin if persistently  >180S Recent Labs  Lab 03/11/22 0757 03/11/22 1145 03/11/22 1624 03/11/22 2107 03/12/22 0754  GLUCAP 168* 154* 239* 186* 137*    Hyperlipidemia associated with type 2 diabetes mellitus: On statin  Essential hypertension: Controlled on Cozaar   Morbid Obesity:Patient's Body mass index is 40.34 kg/m. : Will benefit with PCP follow-up, weight loss  healthy lifestyle and referral for sleep apnea evaluation.  DVT prophylaxis:   Lovenox Code Status:   Code Status: Full Code Family Communication: plan of care discussed with patient at bedside. Patient status is: Inpatient because  of ongoing management of left foot abscess Level of care: Med-Surg   Dispo: The patient is from: HOME            Anticipated disposition: HOME Monday  Mobility  Assessment (last 72 hours)     Mobility Assessment     Row Name 03/11/22 1926 03/11/22 0926 03/11/22 0810 03/10/22 1935 03/10/22 0800   Does patient have an order for bedrest or is patient medically unstable No - Continue assessment -- No - Continue assessment No - Continue assessment No - Continue assessment   What is the highest level of mobility based on the progressive mobility assessment? Level 5 (Walks with assist in room/hall) - Balance while stepping forward/back and can walk in room with assist - Complete Level 5 (Walks with assist in room/hall) - Balance while stepping forward/back and can walk in room with assist - Complete Level 6 (Walks independently in room and hall) - Balance while walking in room without assist - Complete Level 6 (Walks independently in room and hall) - Balance while walking in room without assist - Complete Level 6 (Walks independently in room and hall) - Balance while walking in room without assist - Complete    Row Name 03/09/22 2147           Does patient have an order for bedrest or is patient medically unstable No - Continue assessment       What is the highest level of mobility based on the progressive mobility assessment? Level 6 (Walks independently in room and hall) - Balance while walking in room without assist - Complete                 Objective: Vitals last 24 hrs: Vitals:   03/11/22 1620 03/11/22 2018 03/12/22 0625 03/12/22 0752  BP: 116/60 (!) 116/50 112/66 125/71  Pulse: 61 (!) 58 (!) 51 (!) 52  Resp: '18 18  18  '$ Temp: 97.7 F (36.5 C) 98.2 F (36.8 C) 97.9 F (36.6 C) 97.9 F (36.6 C)  TempSrc: Oral Oral Oral Oral  SpO2: 98% 96% 99% 100%  Weight:      Height:       Weight change:   Physical Examination: General exam: AAox3, older than stated age, weak appearing. HEENT:Oral mucosa moist, Ear/Nose WNL grossly, dentition normal. Respiratory system: bilaterally diminished, no use of accessory muscle Cardiovascular system: S1 &  S2 +, No JVD,. Gastrointestinal system: Abdomen soft,NT,ND,BS+ Nervous System:Alert, awake, moving extremities and grossly nonfocal Extremities: LE ankle edema neg, left foot in dressing Skin: No rashes,no icterus. MSK: Normal muscle bulk,tone, power   Medications reviewed:  Scheduled Meds:  cholecalciferol  1,000 Units Oral Daily   docusate sodium  100 mg Oral BID   enoxaparin (LOVENOX) injection  50 mg Subcutaneous Q24H   insulin aspart  0-15 Units Subcutaneous TID WC   insulin aspart  0-5 Units Subcutaneous QHS   latanoprost  1 drop Both Eyes QHS   losartan  50 mg Oral Daily   rosuvastatin  5 mg Oral Daily   scopolamine  1 patch Transdermal Q72H   sodium chloride flush  3 mL Intravenous Q12H   Continuous Infusions:  sodium chloride     sodium chloride 10 mL/hr at 03/10/22 1449   ceFEPime (MAXIPIME) IV 2 g (03/12/22 0512)   methocarbamol (ROBAXIN) IV     vancomycin 1,250 mg (03/11/22 1418)      Diet Order             Diet  Carb Modified Fluid consistency: Thin; Room service appropriate? Yes  Diet effective now                            Intake/Output Summary (Last 24 hours) at 03/12/2022 0814 Last data filed at 03/12/2022 0600 Gross per 24 hour  Intake 1130 ml  Output --  Net 1130 ml   Net IO Since Admission: 1,745 mL [03/12/22 0814]  Wt Readings from Last 3 Encounters:  03/10/22 106.6 kg  03/09/22 107.2 kg  03/07/22 106.9 kg     Unresulted Labs (From admission, onward)     Start     Ordered   03/16/22 0500  Creatinine, serum  (enoxaparin (LOVENOX)    CrCl >/= 30 ml/min)  Weekly,   R     Comments: while on enoxaparin therapy    03/10/22 0603   03/14/22 1330  Vancomycin, trough  Once,   TIMED       See Hyperspace for full Linked Orders Report.   03/12/22 0725   03/13/22 1630  Vancomycin, peak  Once,   TIMED       See Hyperspace for full Linked Orders Report.   03/12/22 0725   03/11/22 6010  Basic metabolic panel  Daily,   R      03/10/22 1112           Data Reviewed: I have personally reviewed following labs and imaging studies CBC: Recent Labs  Lab 03/09/22 1409 03/10/22 0631 03/11/22 0211  WBC 7.7 6.1 8.6  NEUTROABS 4.4  --   --   HGB 14.2 12.5 13.3  HCT 45.2 39.1 40.5  MCV 89.2 87.9 87.3  PLT 313 275 932   Basic Metabolic Panel: Recent Labs  Lab 03/09/22 1409 03/10/22 0631 03/11/22 0211 03/12/22 0101  NA 138 138 136 139  K 4.2 4.5 4.5 4.4  CL 107 111 107 107  CO2 24 21* 22 23  GLUCOSE 131* 161* 186* 165*  BUN '12 10 14 19  '$ CREATININE 1.00 0.81 0.81 0.80  CALCIUM 8.9 8.6* 9.4 8.9   GFR: Estimated Creatinine Clearance: 92.5 mL/min (by C-G formula based on SCr of 0.8 mg/dL). Liver Function Tests: Recent Labs  Lab 03/09/22 1409  AST 15  ALT 21  ALKPHOS 68  BILITOT 0.5  PROT 7.7  ALBUMIN 3.9   No results for input(s): LIPASE, AMYLASE in the last 168 hours. No results for input(s): AMMONIA in the last 168 hours. Coagulation Profile: Recent Labs  Lab 03/09/22 1409  INR 1.0   BNP (last 3 results) No results for input(s): PROBNP in the last 8760 hours. HbA1C: No results for input(s): HGBA1C in the last 72 hours. CBG: Recent Labs  Lab 03/11/22 0757 03/11/22 1145 03/11/22 1624 03/11/22 2107 03/12/22 0754  GLUCAP 168* 154* 239* 186* 137*   Lipid Profile: No results for input(s): CHOL, HDL, LDLCALC, TRIG, CHOLHDL, LDLDIRECT in the last 72 hours. Thyroid Function Tests: No results for input(s): TSH, T4TOTAL, FREET4, T3FREE, THYROIDAB in the last 72 hours. Sepsis Labs: Recent Labs  Lab 03/09/22 1409 03/09/22 1716  LATICACIDVEN 1.3 1.8    Recent Results (from the past 240 hour(s))  Culture, blood (routine x 2)     Status: None (Preliminary result)   Collection Time: 03/09/22  2:09 PM   Specimen: BLOOD  Result Value Ref Range Status   Specimen Description   Final    BLOOD RIGHT ANTECUBITAL Performed at [facility],  [place] [address], [place], [place] [address]     Special Requests   Final    BOTTLES DRAWN AEROBIC AND ANAEROBIC Blood Culture results may not be optimal due to an excessive volume of blood received in culture bottles Performed at [place] [address], [place], [place] [address]    Culture   Final    NO GROWTH 3 DAYS Performed at [facility], [place] [address], [place], [place] [address]    Report Status PENDING  Incomplete  Culture, blood (routine x 2)     Status: None (Preliminary result)   Collection Time: 03/09/22  2:22 PM   Specimen: BLOOD  Result Value Ref Range Status   Specimen Description   Final    BLOOD LEFT ANTECUBITAL Performed at [place] [address], [place], [place] [address]    Special Requests   Final    BOTTLES DRAWN AEROBIC AND ANAEROBIC Blood Culture adequate volume Performed at [place] [address] [place], [place] [address]    Culture   Final    NO GROWTH 3 DAYS Performed at [facility], [place] [address] [place], [place] [address]    Report Status PENDING  Incomplete  Surgical pcr screen     Status: None   Collection Time: 03/10/22  4:09 AM   Specimen: Nasal Mucosa; Nasal Swab  Result Value Ref Range Status   MRSA, PCR NEGATIVE NEGATIVE Final   Staphylococcus aureus NEGATIVE NEGATIVE Final    Comment: (NOTE) The Xpert SA Assay (FDA approved for NASAL specimens in patients 91 years of age and older), is one component of a comprehensive surveillance program. It is not intended to diagnose infection nor to guide or monitor treatment. Performed at [facility], [place] [address] [place], [place] [address]   Aerobic/Anaerobic Culture w Gram Stain (surgical/deep wound)     Status: None (Preliminary result)   Collection Time: 03/10/22 10:41 AM   Specimen: Wound; Abscess  Result Value Ref Range Status   Specimen Description WOUND  Final   Special Requests LEFT FOOT GREAT TOE  Final   Gram Stain NO WBC SEEN NO  ORGANISMS SEEN   Final   Culture   Final    FEW STAPHYLOCOCCUS EPIDERMIDIS CULTURE REINCUBATED FOR BETTER GROWTH SUSCEPTIBILITIES TO FOLLOW Performed at [facility], [place] [address], [place],  [address]    Report Status PENDING  Incomplete    Antimicrobials: Anti-infectives (From admission, onward)    Start     Dose/Rate Route Frequency Ordered Stop   03/11/22 0830  ceFEPIme (MAXIPIME) 2 g in sodium chloride 0.9 % 100 mL IVPB        2 g 200 mL/hr over 30 Minutes Intravenous Every 8 hours 03/11/22 0730     03/10/22 1400  vancomycin (VANCOREADY) IVPB 1250 mg/250 mL        1,250 mg 166.7 mL/hr over 90 Minutes Intravenous Every 24 hours 03/09/22 1706     03/10/22 0903  ceFAZolin (ANCEF) 2-4 GM/100ML-% IVPB  Status:  Discontinued       Note to Pharmacy: Granville Lewis, Lindsi R: cabinet override      03/10/22 0903 03/10/22 0918   03/10/22 0645  ceFAZolin (ANCEF) IVPB 2g/100 mL premix  Status:  Discontinued        2 g 200 mL/hr over 30 Minutes Intravenous On call to O.R. 03/10/22 0559 03/10/22 1150   03/09/22 1415  cefTRIAXone (ROCEPHIN) 2  g in sodium chloride 0.9 % 100 mL IVPB        2 g 200 mL/hr over 30 Minutes Intravenous  Once 03/09/22 1402 03/09/22 1455   03/09/22 1415  vancomycin (VANCOREADY) IVPB 2000 mg/400 mL        2,000 mg 200 mL/hr over 120 Minutes Intravenous  Once 03/09/22 1406 03/09/22 1657      Culture/Microbiology    Component Value Date/Time   SDES WOUND 03/10/2022 1041   SPECREQUEST LEFT FOOT GREAT TOE 03/10/2022 1041   CULT  03/10/2022 1041    FEW STAPHYLOCOCCUS EPIDERMIDIS CULTURE REINCUBATED FOR BETTER GROWTH SUSCEPTIBILITIES TO FOLLOW Performed at [facility], [place] [address] [place], [place] [address]    REPTSTATUS PENDING 03/10/2022 1041  Radiology Studies: No results found.   LOS: 3 days   Antonieta Pert, MD [facility]  03/12/2022, 8:14 AM

## 2022-03-12 NOTE — Progress Notes (Signed)
Pharmacy Antibiotic Note  April Cooper is a 56 y.o. female admitted on 03/09/2022 with  wound infection of left foot [big toe area] s/p debridement with Dr. Sharol Given awaiting culture results  .  Pharmacy has been consulted for cefepime + vanco dosing.  Plan: Continue: Cefepime 2 grams iv q8h Vanco 1250 mg iv q24h- peak and trough level assessment with 03/13/2022 dose  Height: '5\' 4"'$  (162.6 cm) Weight: 106.6 kg (235 lb) IBW/kg (Calculated) : 54.7  Temp (24hrs), Avg:97.8 F (36.6 C), Min:97.5 F (36.4 C), Max:98.2 F (36.8 C)  Recent Labs  Lab 03/09/22 1409 03/09/22 1716 03/10/22 0631 03/11/22 0211 03/12/22 0101  WBC 7.7  --  6.1 8.6  --   CREATININE 1.00  --  0.81 0.81 0.80  LATICACIDVEN 1.3 1.8  --   --   --      Estimated Creatinine Clearance: 92.5 mL/min (by C-G formula based on SCr of 0.8 mg/dL).    Allergies  Allergen Reactions   Penicillins Hives and Rash    Did it involve swelling of the face/tongue/throat, SOB, or low BP? No Did it involve sudden or severe rash/hives, skin peeling, or any reaction on the inside of your mouth or nose? Yes Did you need to seek medical attention at a hospital or doctor's office? No When did it last happen?   childhood    If all above answers are "NO", may proceed with cephalosporin use.      Antimicrobials this admission: 6/1 vanc >>  6/1 CTX >> 6/1 6/2 Cefepime >>   Microbiology results: 6/1 BCx: ngtd [D4] 6/1 abscess from wound [content] No WBC / few staph epi seen. Re-culture for more growth. Susceptibilities to follow 6/2 MRSA PCR: negative  Thank you for allowing pharmacy to be a part of this patient's care.  Vaughan Basta BS, PharmD, BCPS Clinical Pharmacist 03/12/2022 7:20 AM  Contact: [phone] after 3 PM  "Be curious, not judgmental..." -Jamal Maes

## 2022-03-13 LAB — BASIC METABOLIC PANEL
Anion gap: 4 — ABNORMAL LOW (ref 5–15)
BUN: 15 mg/dL (ref 6–20)
CO2: 23 mmol/L (ref 22–32)
Calcium: 8.8 mg/dL — ABNORMAL LOW (ref 8.9–10.3)
Chloride: 109 mmol/L (ref 98–111)
Creatinine, Ser: 0.81 mg/dL (ref 0.44–1.00)
GFR, Estimated: >60 mL/min (ref >60)
Glucose, Bld: 151 mg/dL — ABNORMAL HIGH (ref 70–99)
Potassium: 4.2 mmol/L (ref 3.5–5.1)
Sodium: 136 mmol/L (ref 135–145)

## 2022-03-13 LAB — GLUCOSE, CAPILLARY
Glucose-Capillary: 168 mg/dL — ABNORMAL HIGH (ref 70–99)
Glucose-Capillary: 133 mg/dL — ABNORMAL HIGH (ref 70–99)

## 2022-03-13 NOTE — Discharge Summary (Signed)
Physician Discharge Summary  DAO MEARNS QAS:[id number] DOB: 11/03/1965 DOA: 58/10/2021  PCP: Lesleigh Noe, MD  Admit date: 03/09/2022 Discharge date: 03/13/2022 Recommendations for Outpatient Follow-up:  Follow up with PCP in 1 weeks-call for appointment Please obtain BMP/CBC in one week  Discharge Dispo: Home Discharge Condition: Stable Code Status:   Code Status: Full Code Diet recommendation:  Diet Order             Diet - low sodium heart healthy           Diet Carb Modified Fluid consistency: Thin; Room service appropriate? Yes  Diet effective now                    Brief/Interim Summary: 56-yof who is independent, lives with family, medical history significant for type II DM, HTN, HLD, morbid obesity, presented to the [facility] ED on 03/09/2022 with Left foot/big toe area pain, swelling, redness and a large blister. 5 days PTA she noted itching in between her left foot first webspace.  By the next 48 hours, this had gotten significantly worse with painful swelling, some darkish looking spots, small blister and painful weightbearing.  On she was seen by her PCP and started on Keflex and Bactrim for suspected cellulitis from a possible insect bite.  2 to 3 days prior to symptom onset, she was in downtown [place] with her family dining but does not recollect any pain from a insect bite or sting.  She does not recollect stepping on any sharp objects and wears footwear all the time.The same evening on 5/30, the blister between the left foot first webspace got significantly larger and she presented to be ED who concurred with the regimen initiated by PCP and patient returned home.  Patient had a regular follow-up with her PCP and she also noted progressively worsened swelling, redness, enlarging blister which was darkish in color in the first webspace (pictures below) and was referred her to ED for possible IV antibiotics and further interventions. In ED-Afebrile with  unremarkable vital signs.  CMP and CBC without abnormality except glucose of 131.  Blood cultures x2 have been drawn and pending.  X-ray of the left foot shows swelling of the first and second toes without subcutaneous air. EDP provided a dose of IV vancomycin and ceftriaxone, consulted orthopedics who recommend admission to [facility] for incision and drainage on 03/10/22..  Managing vancomycin cefepime, pharmacy adjusting dose, podiatry following.  Pain is controlled overall patient is much improved, wound culture with staph epidermidis pansensitive.  Once cleared by podiatry she will be discharged home on oral antibiotics with instruction for outpatient follow-up   Discharge Diagnoses:  Principal Problem:   Cellulitis and abscess of foot Active Problems:   Controlled type 2 diabetes mellitus without complication, without long-term current use of insulin (HCC)   Hyperlipidemia associated with type 2 diabetes mellitus (HCC)   Class 3 severe obesity due to excess calories with serious comorbidity and body mass index (BMI) of 40.0 to 44.9 in adult [facility])   Essential hypertension   Diabetic foot infection ([place])  Cellulitis and abscess of Left Foot Diabetic foot infection: S/p left foot debridement 6/2-Dr. Sharol Given.managed with vancomycin IV, cefepime given diabetes pending further culture data.  Culture grew Staph epidermidis-discussed with Dr. Sharol Given okay for discharge home patient had Bactrim prior to admission has at least 8 days worth remaining Dr Sharol Given is okay with completing the course, and patient will be seen in the office  in a week incase she needs longer course.   Type 2 diabetes mellitus with uncontrolled hyperglycemia w/o long-term use of insulin Blood sugar now overall stable -resume home metformin upon discharge Ozempic has been discontinued for 3 weeks.   Hyperlipidemia associated with type 2 diabetes mellitus: On statin   Essential hypertension: Controlled on Cozaar   Morbid  Obesity:Patient's Body mass index is 40.34 kg/m. : Will benefit with PCP follow-up, weight loss  healthy lifestyle and referral for sleep apnea evaluation  Consults: Podiatry Subjective: Alert awake oriented resting comfortably this morning no new complaints.  Discharge Exam: Vitals:   03/13/22 0448 03/13/22 0805  BP: 130/70 (!) 137/58  Pulse: 60 66  Resp: 20 17  Temp: 97.7 F (36.5 C) 97.7 F (36.5 C)  SpO2: 98% 97%   General: Pt is alert, awake, not in acute distress Cardiovascular: RRR, S1/S2 +, no rubs, no gallops Respiratory: CTA bilaterally, no wheezing, no rhonchi Abdominal: Soft, NT, ND, bowel sounds + Extremities: no edema, no cyanosis  Discharge Instructions  Discharge Instructions     Diet - low sodium heart healthy   Complete by: As directed    Discharge instructions   Complete by: As directed    Please call Dr. Jess Barters office for follow-up and wound care call the office in 2 to 3 days Please call call MD or return to ER for similar or worsening recurring problem that brought you to hospital or if any fever,nausea/vomiting,abdominal pain, uncontrolled pain, chest pain,  shortness of breath or any other alarming symptoms.  Please follow-up your doctor as instructed in a week time and call the office for appointment.  Please avoid alcohol, smoking, or any other illicit substance and maintain healthy habits including taking your regular medications as prescribed.  You were cared for by a hospitalist during your hospital stay. If you have any questions about your discharge medications or the care you received while you were in the hospital after you are discharged, you can call the unit and ask to speak with the hospitalist on call if the hospitalist that took care of you is not available.  Once you are discharged, your primary care physician will handle any further medical issues. Please note that NO REFILLS for any discharge medications will be authorized once you  are discharged, as it is imperative that you return to your primary care physician (or establish a relationship with a primary care physician if you do not have one) for your aftercare needs so that they can reassess your need for medications and monitor your lab values   Discharge wound care:   Complete by: As directed    Reinforce until discontinued follow-up Dr. Jess Barters office for further instruction call the office in 2 days   Increase activity slowly   Complete by: As directed       Allergies as of 03/13/2022       Reactions   Penicillins Hives, Rash   Did it involve swelling of the face/tongue/throat, SOB, or low BP? No Did it involve sudden or severe rash/hives, skin peeling, or any reaction on the inside of your mouth or nose? Yes Did you need to seek medical attention at a hospital or doctor's office? No When did it last happen?   childhood    If all above answers are "NO", may proceed with cephalosporin use.        Medication List     STOP taking these medications    cephALEXin 500 MG capsule Commonly  known as: KEFLEX       TAKE these medications    Accu-Chek FastClix Lancets Misc 1 each by Does not apply route in the morning, at noon, and at bedtime.   Accu-Chek Guide test strip Generic drug: glucose blood USE UP TO 4 TIMES A DAY AS DIRECTED   blood glucose meter kit and supplies Dispense based on patient and insurance preference. Use up to four times daily as directed. (FOR ICD-10 E10.9, E11.9).   cholecalciferol 25 MCG (1000 UNIT) tablet Commonly known as: VITAMIN D3 Take 1,000 Units by mouth daily.   ibuprofen 200 MG tablet Commonly known as: ADVIL Take 600 mg by mouth every 6 (six) hours as needed for moderate pain.   latanoprost 0.005 % ophthalmic solution Commonly known as: XALATAN Place 1 drop into both eyes at bedtime.   losartan 50 MG tablet Commonly known as: COZAAR Take 1 tablet (50 mg total) by mouth daily.   metFORMIN 1000 MG  tablet Commonly known as: GLUCOPHAGE Take 1 tablet (1,000 mg total) by mouth every evening.   Ozempic (1 MG/DOSE) 4 MG/3ML Sopn Generic drug: Semaglutide (1 MG/DOSE) Inject 1 mg into the skin once a week.   rosuvastatin 5 MG tablet Commonly known as: CRESTOR TAKE 1 TABLET (5 MG TOTAL) BY MOUTH DAILY.   sulfamethoxazole-trimethoprim 800-160 MG tablet Commonly known as: BACTRIM DS Take 1 tablet by mouth 2 (two) times daily for 10 days.               Discharge Care Instructions  (From admission, onward)           Start     Ordered   03/13/22 0000  Discharge wound care:       Comments: Reinforce until discontinued follow-up Dr. Jess Barters office for further instruction call the office in 2 days   03/13/22 1023            Follow-up Information     Newt Minion, MD Follow up in 1 week(s).   Specialty: Orthopedic Surgery Contact information: [place] [address] [phone]         Lesleigh Noe, MD Follow up in 1 week(s).   Specialty: Family Medicine Contact information: [place] [address] [phone]                Allergies  Allergen Reactions   Penicillins Hives and Rash    Did it involve swelling of the face/tongue/throat, SOB, or low BP? No Did it involve sudden or severe rash/hives, skin peeling, or any reaction on the inside of your mouth or nose? Yes Did you need to seek medical attention at a hospital or doctor's office? No When did it last happen?   childhood    If all above answers are "NO", may proceed with cephalosporin use.      The results of significant diagnostics from this hospitalization (including imaging, microbiology, ancillary and laboratory) are listed below for reference.    Microbiology: Recent Results (from the past 240 hour(s))  Culture, blood (routine x 2)     Status: None (Preliminary result)   Collection Time: 03/09/22  2:09 PM   Specimen: BLOOD  Result Value Ref  Range Status   Specimen Description   Final    BLOOD RIGHT ANTECUBITAL Performed at [facility] [address], [place], [place] [address]    Special Requests   Final    BOTTLES DRAWN AEROBIC AND ANAEROBIC Blood Culture  results may not be optimal due to an excessive volume of blood received in culture bottles Performed at [facility] [address] [place], [place] [address]    Culture   Final    NO GROWTH 4 DAYS Performed at [facility], [place] [address] [place], [place] [address]    Report Status PENDING  Incomplete  Culture, blood (routine x 2)     Status: None (Preliminary result)   Collection Time: 03/09/22  2:22 PM   Specimen: BLOOD  Result Value Ref Range Status   Specimen Description   Final    BLOOD LEFT ANTECUBITAL Performed at [facility] [address] [place], [place] [address]    Special Requests   Final    BOTTLES DRAWN AEROBIC AND ANAEROBIC Blood Culture adequate volume Performed at [facility] [address], [place], [place] [address]    Culture   Final    NO GROWTH 4 DAYS Performed at [facility], [place] [address] [place], [place] [address]    Report Status PENDING  Incomplete  Surgical pcr screen     Status: None   Collection Time: 03/10/22  4:09 AM   Specimen: Nasal Mucosa; Nasal Swab  Result Value Ref Range Status   MRSA, PCR NEGATIVE NEGATIVE Final   Staphylococcus aureus NEGATIVE NEGATIVE Final    Comment: (NOTE) The Xpert SA Assay (FDA approved for NASAL specimens in patients 51 years of age and older), is one component of a comprehensive surveillance program. It is not intended to diagnose infection nor to guide or monitor treatment. Performed at [facility], [place] [address], [place], [place] [address]   Aerobic/Anaerobic Culture w Gram Stain (surgical/deep wound)     Status: None (Preliminary result)   Collection Time: 03/10/22 10:41 AM    Specimen: Wound; Abscess  Result Value Ref Range Status   Specimen Description WOUND  Final   Special Requests LEFT FOOT GREAT TOE  Final   Gram Stain   Final    NO WBC SEEN NO ORGANISMS SEEN Performed at [facility], [address], [place], [place] [address]    Culture   Final    FEW STAPHYLOCOCCUS EPIDERMIDIS NO ANAEROBES ISOLATED; CULTURE IN PROGRESS FOR 5 DAYS    Report Status PENDING  Incomplete   Organism ID, Bacteria STAPHYLOCOCCUS EPIDERMIDIS  Final      Susceptibility   Staphylococcus epidermidis - MIC*    CIPROFLOXACIN <=0.5 SENSITIVE Sensitive     ERYTHROMYCIN <=0.25 SENSITIVE Sensitive     GENTAMICIN <=0.5 SENSITIVE Sensitive     OXACILLIN <=0.25 SENSITIVE Sensitive     TETRACYCLINE <=1 SENSITIVE Sensitive     VANCOMYCIN 1 SENSITIVE Sensitive     TRIMETH/SULFA <=10 SENSITIVE Sensitive     CLINDAMYCIN <=0.25 SENSITIVE Sensitive     RIFAMPIN <=0.5 SENSITIVE Sensitive     Inducible Clindamycin NEGATIVE Sensitive     * FEW STAPHYLOCOCCUS EPIDERMIDIS    Procedures/Studies: DG Foot Complete Left  Result Date: 03/09/2022 CLINICAL DATA:  Soft tissue wound.  Bite on left great toe. EXAM: LEFT FOOT - COMPLETE 3+ VIEW COMPARISON:  None Available. FINDINGS: Moderate great toe soft tissue swelling. Likely additional soft tissue swelling of the proximal aspect of the second toe. Mild great toe metatarsophalangeal joint space narrowing with mild-to-moderate peripheral lateral greater than medial and dorsal degenerative osteophytes. Small plantar calcaneal heel spur. No acute fracture is seen. No dislocation. IMPRESSION: 1. Mild great  toe metatarsophalangeal joint osteoarthritis. 2. Swelling of the first and second toes. No subcutaneous air is seen. Electronically Signed   By: Yvonne Kendall M.D.   On: 03/09/2022 14:13    Labs: BNP (last 3 results) No results for input(s): BNP in the last 8760 hours. Basic Metabolic Panel: Recent Labs  Lab 03/09/22 1409 03/10/22 0631  03/11/22 0211 03/12/22 0101 03/13/22 0330  NA 138 138 136 139 136  K 4.2 4.5 4.5 4.4 4.2  CL 107 111 107 107 109  CO2 24 21* '22 23 23  ' GLUCOSE 131* 161* 186* 165* 151*  BUN '12 10 14 19 15  ' CREATININE 1.00 0.81 0.81 0.80 0.81  CALCIUM 8.9 8.6* 9.4 8.9 8.8*   Liver Function Tests: Recent Labs  Lab 03/09/22 1409  AST 15  ALT 21  ALKPHOS 68  BILITOT 0.5  PROT 7.7  ALBUMIN 3.9   No results for input(s): LIPASE, AMYLASE in the last 168 hours. No results for input(s): AMMONIA in the last 168 hours. CBC: Recent Labs  Lab 03/09/22 1409 03/10/22 0631 03/11/22 0211  WBC 7.7 6.1 8.6  NEUTROABS 4.4  --   --   HGB 14.2 12.5 13.3  HCT 45.2 39.1 40.5  MCV 89.2 87.9 87.3  PLT 313 275 310   Cardiac Enzymes: No results for input(s): CKTOTAL, CKMB, CKMBINDEX, TROPONINI in the last 168 hours. BNP: Invalid input(s): POCBNP CBG: Recent Labs  Lab 03/12/22 0754 03/12/22 1421 03/12/22 1652 03/12/22 2203 03/13/22 0806  GLUCAP 137* 163* 206* 116* 168*   D-Dimer No results for input(s): DDIMER in the last 72 hours. Hgb A1c No results for input(s): HGBA1C in the last 72 hours. Lipid Profile No results for input(s): CHOL, HDL, LDLCALC, TRIG, CHOLHDL, LDLDIRECT in the last 72 hours. Thyroid function studies No results for input(s): TSH, T4TOTAL, T3FREE, THYROIDAB in the last 72 hours.  Invalid input(s): FREET3 Anemia work up No results for input(s): VITAMINB12, FOLATE, FERRITIN, TIBC, IRON, RETICCTPCT in the last 72 hours. Urinalysis    Component Value Date/Time   COLORURINE YELLOW 01/06/2014 0852   APPEARANCEUR CLEAR 01/06/2014 0852   LABSPEC 1.020 07/07/2018 1404   PHURINE 7.0 07/07/2018 1404   GLUCOSEU NEGATIVE 07/07/2018 1404   GLUCOSEU NEGATIVE 01/06/2014 0852   HGBUR TRACE (A) 07/07/2018 1404   BILIRUBINUR NEGATIVE 07/07/2018 1404   BILIRUBINUR neg 12/06/2012 1342   KETONESUR NEGATIVE 07/07/2018 1404   PROTEINUR NEGATIVE 07/07/2018 1404   UROBILINOGEN 0.2  07/07/2018 1404   NITRITE NEGATIVE 07/07/2018 1404   LEUKOCYTESUR NEGATIVE 07/07/2018 1404   Sepsis Labs Invalid input(s): PROCALCITONIN,  WBC,  LACTICIDVEN Microbiology Recent Results (from the past 240 hour(s))  Culture, blood (routine x 2)     Status: None (Preliminary result)   Collection Time: 03/09/22  2:09 PM   Specimen: BLOOD  Result Value Ref Range Status   Specimen Description   Final    BLOOD RIGHT ANTECUBITAL Performed at [facility], [place] [address], [place], [place] [address]    Special Requests   Final    BOTTLES DRAWN AEROBIC AND ANAEROBIC Blood Culture results may not be optimal due to an excessive volume of blood received in culture bottles Performed at [facility] [address], [place], [place] [address]    Culture   Final    NO GROWTH 4 DAYS Performed at [facility], [place] [address] [place], [place] [address]    Report Status PENDING  Incomplete  Culture, blood (routine x  2)     Status: None (Preliminary result)   Collection Time: 03/09/22  2:22 PM   Specimen: BLOOD  Result Value Ref Range Status   Specimen Description   Final    BLOOD LEFT ANTECUBITAL Performed at [facility] [address], [place], [place] [address]    Special Requests   Final    BOTTLES DRAWN AEROBIC AND ANAEROBIC Blood Culture adequate volume Performed at [facility] [address] [place], [place] [address]    Culture   Final    NO GROWTH 4 DAYS Performed at [facility], [place] [address], [place], [place] [address]    Report Status PENDING  Incomplete  Surgical pcr screen     Status: None   Collection Time: 03/10/22  4:09 AM   Specimen: Nasal Mucosa; Nasal Swab  Result Value Ref Range Status   MRSA, PCR NEGATIVE NEGATIVE Final   Staphylococcus aureus NEGATIVE NEGATIVE Final    Comment: (NOTE) The Xpert SA Assay (FDA approved for NASAL specimens in patients 46 years of age and  older), is one component of a comprehensive surveillance program. It is not intended to diagnose infection nor to guide or monitor treatment. Performed at [facility], [place] [address] [place], [place] [address]   Aerobic/Anaerobic Culture w Gram Stain (surgical/deep wound)     Status: None (Preliminary result)   Collection Time: 03/10/22 10:41 AM   Specimen: Wound; Abscess  Result Value Ref Range Status   Specimen Description WOUND  Final   Special Requests LEFT FOOT GREAT TOE  Final   Gram Stain   Final    NO WBC SEEN NO ORGANISMS SEEN Performed at [facility], [address], [place], [place] [address]    Culture   Final    FEW STAPHYLOCOCCUS EPIDERMIDIS NO ANAEROBES ISOLATED; CULTURE IN PROGRESS FOR 5 DAYS    Report Status PENDING  Incomplete   Organism ID, Bacteria STAPHYLOCOCCUS EPIDERMIDIS  Final      Susceptibility   Staphylococcus epidermidis - MIC*    CIPROFLOXACIN <=0.5 SENSITIVE Sensitive     ERYTHROMYCIN <=0.25 SENSITIVE Sensitive     GENTAMICIN <=0.5 SENSITIVE Sensitive     OXACILLIN <=0.25 SENSITIVE Sensitive     TETRACYCLINE <=1 SENSITIVE Sensitive     VANCOMYCIN 1 SENSITIVE Sensitive     TRIMETH/SULFA <=10 SENSITIVE Sensitive     CLINDAMYCIN <=0.25 SENSITIVE Sensitive     RIFAMPIN <=0.5 SENSITIVE Sensitive     Inducible Clindamycin NEGATIVE Sensitive     * FEW STAPHYLOCOCCUS EPIDERMIDIS     Time coordinating discharge: 35 minutes  SIGNED: Antonieta Pert, MD  [facility] 03/13/2022, 10:23 AM  If 7PM-7AM, please contact night-coverage [email or web address]

## 2022-03-13 NOTE — TOC CM/SW Note (Signed)
Per nurse Dr Sharol Given OK with knee scooter. Ordered with [employer]. They will call patient directly regarding insurance coverage and address she will need to pick it up at. PAtient and family at bedside aware.

## 2022-03-13 NOTE — Evaluation (Signed)
Physical Therapy Evaluation Patient Details Name: April Cooper MRN: [id number] DOB: May 02, 1966 Today's Date: 03/13/2022  History of Present Illness  Pt is a 56 y/o female admitted 5/30 secondary to L foot/toe blistering. Pt is s/p surgery on 6/2 left foot for infection/abscess.  PMH significant for but not limited to:DM, HTN  Clinical Impression  Pt admitted secondary to problem above with deficits below. Pt seen again as pt struggling to use RW. Practiced with knee scooter, and pt able to maintain NWB and reports she feels more comfortable using. Min guard for safety throughout. Reviewed safety precautions with use of knee scooter. Recommend knee scooter for home. Will continue to follow acutely.        Recommendations for follow up therapy are one component of a multi-disciplinary discharge planning process, led by the attending physician.  Recommendations may be updated based on patient status, additional functional criteria and insurance authorization.  Follow Up Recommendations No PT follow up    Assistance Recommended at Discharge Intermittent Supervision/Assistance  Patient can return home with the following  A little help with bathing/dressing/bathroom;Assistance with cooking/housework;Help with stairs or ramp for entrance;Assist for transportation    Equipment Recommendations Other (comment) (knee scooter)  Recommendations for Other Services       Functional Status Assessment Patient has had a recent decline in their functional status and demonstrates the ability to make significant improvements in function in a reasonable and predictable amount of time.     Precautions / Restrictions Precautions Precautions: Fall Required Braces or Orthoses: Other Brace Other Brace: L post op shoe Restrictions Weight Bearing Restrictions: Yes LLE Weight Bearing: Non weight bearing      Mobility  Bed Mobility Overal bed mobility: Modified Independent                   Transfers Overall transfer level: Needs assistance Equipment used:  (knee scooter) Transfers: Sit to/from Stand Sit to Stand: Min guard           General transfer comment: Min guard for safety to perform transfer with knee scooter. No LOB noted.    Ambulation/Gait Ambulation/Gait assistance: Min guard, Supervision Gait Distance (Feet): 30 Feet Assistive device:  (knee scooter) Gait Pattern/deviations: Step-to pattern Gait velocity: Decreased     General Gait Details: Performed mobility around the room with use of knee scooter. Worked on navigating obstacles and turns. Pt just had dressing change, so wanted to limit mobility secondary to pain. Improved ability to maintain NWB with use of knee scooter in comparison to RW.  Stairs            Wheelchair Mobility    Modified Rankin (Stroke Patients Only)       Balance Overall balance assessment: Needs assistance Sitting-balance support: No upper extremity supported Sitting balance-Leahy Scale: Normal     Standing balance support: Bilateral upper extremity supported Standing balance-Leahy Scale: Poor Standing balance comment: Reliant on UE support                             Pertinent Vitals/Pain Pain Assessment Pain Assessment: Faces Faces Pain Scale: Hurts even more Pain Location: L foot Pain Descriptors / Indicators: Guarding, Grimacing Pain Intervention(s): Limited activity within patient's tolerance, Repositioned, Monitored during session, Patient requesting pain meds-RN notified    Home Living Family/patient expects to be discharged to:: Private residence Living Arrangements: Children Available Help at Discharge: Family Type of Home: House Home Access: Level entry  Alternate Level Stairs-Number of Steps: flght Home Layout: Two level;Able to live on main level with bedroom/bathroom Home Equipment: None      Prior Function Prior Level of Function : Independent/Modified  Independent                     Hand Dominance        Extremity/Trunk Assessment   Upper Extremity Assessment Upper Extremity Assessment: Overall WFL for tasks assessed    Lower Extremity Assessment Lower Extremity Assessment: LLE deficits/detail LLE Deficits / Details: L foot with dressing    Cervical / Trunk Assessment Cervical / Trunk Assessment: Normal  Communication   Communication: No difficulties  Cognition Arousal/Alertness: Awake/alert Behavior During Therapy: WFL for tasks assessed/performed Overall Cognitive Status: Within Functional Limits for tasks assessed                                          General Comments      Exercises     Assessment/Plan    PT Assessment Patient needs continued PT services  PT Problem List Decreased activity tolerance;Decreased balance;Decreased mobility;Pain       PT Treatment Interventions DME instruction;Gait training;Therapeutic activities;Functional mobility training;Therapeutic exercise;Balance training;Patient/family education    PT Goals (Current goals can be found in the Care Plan section)  Acute Rehab PT Goals Patient Stated Goal: to go home PT Goal Formulation: With patient Time For Goal Achievement: 03/27/22 Potential to Achieve Goals: Good    Frequency Min 3X/week     Co-evaluation               AM-PAC PT "6 Clicks" Mobility  Outcome Measure Help needed turning from your back to your side while in a flat bed without using bedrails?: None Help needed moving from lying on your back to sitting on the side of a flat bed without using bedrails?: None Help needed moving to and from a bed to a chair (including a wheelchair)?: A Little Help needed standing up from a chair using your arms (e.g., wheelchair or bedside chair)?: A Little Help needed to walk in hospital room?: A Little Help needed climbing 3-5 steps with a railing? : Total 6 Click Score: 18    End of Session  Equipment Utilized During Treatment: Gait belt Activity Tolerance: Patient limited by pain Patient left: in bed;with call bell/phone within reach;with family/visitor present Nurse Communication: Mobility status PT Visit Diagnosis: Other abnormalities of gait and mobility (R26.89);Difficulty in walking, not elsewhere classified (R26.2)    Time: 3785-8850 PT Time Calculation (min) (ACUTE ONLY): 23 min   Charges:   PT Evaluation $PT Eval Low Complexity: 1 Low PT Treatments $Gait Training: 8-22 mins        Lou Miner, DPT  Acute Rehabilitation Services  Office: [phone]   Rudean Hitt 03/13/2022, 12:51 PM

## 2022-03-13 NOTE — Plan of Care (Signed)
  Problem: Education: Goal: Ability to describe self-care measures that may prevent or decrease complications (Diabetes Survival Skills Education) will improve Outcome: Progressing Goal: Individualized Educational Video(s) Outcome: Progressing   Problem: Coping: Goal: Ability to adjust to condition or change in health will improve Outcome: Progressing   Problem: Fluid Volume: Goal: Ability to maintain a balanced intake and output will improve Outcome: Progressing   Problem: Health Behavior/Discharge Planning: Goal: Ability to identify and utilize available resources and services will improve Outcome: Progressing Goal: Ability to manage health-related needs will improve Outcome: Progressing   Problem: Metabolic: Goal: Ability to maintain appropriate glucose levels will improve Outcome: Progressing   Problem: Nutritional: Goal: Maintenance of adequate nutrition will improve Outcome: Progressing Goal: Progress toward achieving an optimal weight will improve Outcome: Progressing   Problem: Skin Integrity: Goal: Risk for impaired skin integrity will decrease Outcome: Progressing   Problem: Education: Goal: Knowledge of General Education information will improve Description: Including pain rating scale, medication(s)/side effects and non-pharmacologic comfort measures Outcome: Progressing   Problem: Health Behavior/Discharge Planning: Goal: Ability to manage health-related needs will improve Outcome: Progressing   Problem: Clinical Measurements: Goal: Ability to maintain clinical measurements within normal limits will improve Outcome: Progressing Goal: Will remain free from infection Outcome: Progressing Goal: Diagnostic test results will improve Outcome: Progressing Goal: Respiratory complications will improve Outcome: Progressing Goal: Cardiovascular complication will be avoided Outcome: Progressing   Problem: Activity: Goal: Risk for activity intolerance will  decrease Outcome: Progressing   Problem: Nutrition: Goal: Adequate nutrition will be maintained Outcome: Progressing   Problem: Safety: Goal: Ability to remain free from injury will improve Outcome: Progressing   Problem: Skin Integrity: Goal: Risk for impaired skin integrity will decrease Outcome: Progressing

## 2022-03-13 NOTE — Progress Notes (Signed)
Patient ID: April Cooper, female   DOB: [birth year], 56 y.o.   MRN: [id number] Patient is status post debridement left foot.  Cultures are showing staph that is pansensitive.  Patient could discharge on oral doxycycline 100 mg twice a day for 2 weeks.  I will have the dressing changed and Penrose drain removed.  Patient may discharge from an orthopedic standpoint today.

## 2022-03-13 NOTE — Progress Notes (Signed)
Mobility Specialist Progress Note:   03/13/22 1000  Mobility  Activity Ambulated with assistance in hallway  Level of Assistance Standby assist, set-up cues, supervision of patient - no hands on  Assistive Device Front wheel walker  LLE Weight Bearing NWB  Distance Ambulated (ft) 45 ft  Activity Response Tolerated well  $Mobility charge 1 Mobility   Pt agreeable to mobility session this am. Ambulated at supervision level with RW. Able to maintain NWB on LLE with max verbal cues. Distance limited d/t fatigue. Pt back in bed with all needs met.   Nelta Numbers Acute Rehab Secure Chat or Office Phone: [phone]

## 2022-03-14 ENCOUNTER — Telehealth: Payer: Self-pay | Admitting: Family Medicine

## 2022-03-14 ENCOUNTER — Telehealth: Payer: Self-pay

## 2022-03-14 LAB — CULTURE, BLOOD (ROUTINE X 2)
Culture: NO GROWTH
Culture: NO GROWTH
Special Requests: ADEQUATE

## 2022-03-14 NOTE — Telephone Encounter (Addendum)
Transition Care Management Unsuccessful Follow-up Telephone Call  Date of discharge and from where:  [facility] 03-13-22 Dx: cellulitis and abscess of foot   Attempts:  1st Attempt  Reason for unsuccessful TCM follow-up call:  Left voice message  Transition Care Management Follow-up Telephone Call Date of discharge and from where: [place] 03-13-22 Dx: cellulitis and abscess of foot  How have you been since you were released from the hospital? Doing good  Any questions or concerns? No  Items Reviewed: Did the pt receive and understand the discharge instructions provided? Yes  Medications obtained and verified? Yes  Other? No  Any new allergies since your discharge? No  Dietary orders reviewed? Yes Do you have support at home? Yes   Home Care and Equipment/Supplies: Were home health services ordered? no If so, what is the name of the agency? na  Has the agency set up a time to come to the patient's home? not applicable Were any new equipment or medical supplies ordered?  No What is the name of the medical supply agency? na Were you able to get the supplies/equipment? Yes- pt bought a scooter Do you have any questions related to the use of the equipment or supplies? No  Functional Questionnaire: (I = Independent and D = Dependent) ADLs: I  Bathing/Dressing- I  Meal Prep- I  Eating- I  Maintaining continence- I  Transferring/Ambulation- I  Managing Meds- I  Follow up appointments reviewed:  PCP Hospital f/u appt confirmed? Yes  [place] [facility]? No- pt is waiting for a call back from Dr Sharol Given office for fu appt  Are transportation arrangements needed? No  If their condition worsens, is the pt aware to call PCP or go to the Emergency Dept.? Yes Was the patient provided with contact information for the PCP's office or ED? Yes Was to pt encouraged to call back with questions or concerns? Yes

## 2022-03-14 NOTE — Telephone Encounter (Signed)
Pt called and wanted the nurse and Dr. Einar Pheasant to know that she has an itchy rash underneath both breasts and she also feels like she has a UTI, pt wants to speak with the nurse. Please return a call when possible, thanks.  Callback Number: [phone]

## 2022-03-14 NOTE — Telephone Encounter (Signed)
Agree with recommendations and plan

## 2022-03-14 NOTE — Telephone Encounter (Signed)
Patient has an appointment on Monday.

## 2022-03-15 ENCOUNTER — Telehealth: Payer: Self-pay

## 2022-03-15 LAB — AEROBIC/ANAEROBIC CULTURE W GRAM STAIN (SURGICAL/DEEP WOUND): Gram Stain: NONE SEEN

## 2022-03-15 NOTE — Telephone Encounter (Signed)
Mychart sent to pt telling her to bring form to office

## 2022-03-15 NOTE — Telephone Encounter (Signed)
Patient called in asking if Dr.Cody will fill out a form that way Tasha can get more supplies for the wound on her L foot. The hospital gave her some supplies but she has ran out.

## 2022-03-17 ENCOUNTER — Ambulatory Visit: Payer: 59 | Admitting: Family Medicine

## 2022-03-17 ENCOUNTER — Telehealth: Payer: Self-pay

## 2022-03-17 VITALS — BP 124/78 | HR 86 | Temp 97.3°F | Ht 63.5 in | Wt 231.0 lb

## 2022-03-17 DIAGNOSIS — B379 Candidiasis, unspecified: Secondary | ICD-10-CM | POA: Diagnosis not present

## 2022-03-17 DIAGNOSIS — I10 Essential (primary) hypertension: Secondary | ICD-10-CM

## 2022-03-17 DIAGNOSIS — E11628 Type 2 diabetes mellitus with other skin complications: Secondary | ICD-10-CM | POA: Diagnosis not present

## 2022-03-17 DIAGNOSIS — L02619 Cutaneous abscess of unspecified foot: Secondary | ICD-10-CM

## 2022-03-17 DIAGNOSIS — L089 Local infection of the skin and subcutaneous tissue, unspecified: Secondary | ICD-10-CM | POA: Diagnosis not present

## 2022-03-17 DIAGNOSIS — B372 Candidiasis of skin and nail: Secondary | ICD-10-CM

## 2022-03-17 DIAGNOSIS — L03119 Cellulitis of unspecified part of limb: Secondary | ICD-10-CM | POA: Diagnosis not present

## 2022-03-17 DIAGNOSIS — T3695XA Adverse effect of unspecified systemic antibiotic, initial encounter: Secondary | ICD-10-CM

## 2022-03-17 LAB — CBC
HCT: 42.9 % (ref 36.0–46.0)
Hemoglobin: 14 g/dL (ref 12.0–15.0)
MCHC: 32.6 g/dL (ref 30.0–36.0)
MCV: 86.2 fl (ref 78.0–100.0)
Platelets: 368 10*3/uL (ref 150.0–400.0)
RBC: 4.97 Mil/uL (ref 3.87–5.11)
RDW: 14.8 % (ref 11.5–15.5)
WBC: 8.6 10*3/uL (ref 4.0–10.5)

## 2022-03-17 LAB — BASIC METABOLIC PANEL
BUN: 12 mg/dL (ref 6–23)
CO2: 21 mEq/L (ref 19–32)
Calcium: 9.5 mg/dL (ref 8.4–10.5)
Chloride: 105 mEq/L (ref 96–112)
Creatinine, Ser: 0.81 mg/dL (ref 0.40–1.20)
GFR: 80.46 mL/min (ref >60.00)
Glucose, Bld: 169 mg/dL — ABNORMAL HIGH (ref 70–99)
Potassium: 4.5 mEq/L (ref 3.5–5.1)
Sodium: 135 mEq/L (ref 135–145)

## 2022-03-17 MED ORDER — CLOTRIMAZOLE-BETAMETHASONE 1-0.05 % EX CREA: 1.0000 "application " | TOPICAL_CREAM | Freq: Every day | CUTANEOUS | 0 refills | Status: DC

## 2022-03-17 MED ORDER — FLUCONAZOLE 150 MG PO TABS: 150.0000 mg | ORAL_TABLET | Freq: Once | ORAL | 1 refills | Status: AC

## 2022-03-17 NOTE — Assessment & Plan Note (Signed)
In setting of infection. Recheck labs

## 2022-03-17 NOTE — Assessment & Plan Note (Signed)
Wound exposed. Seems to be healing compared to home photos. No swelling or erythema. Cont abx (bactrim). Cont plan to see ortho. Prescription for wound care supplies.

## 2022-03-17 NOTE — Progress Notes (Signed)
Subjective:     April Cooper is a 55 y.o. female presenting for Hospitalization Follow-up and Rash (Under breasts )     Rash    #Breast rash - using dry soft clothes - itchy - since getting on abx - also in the leg crease too - also vaginal discharge too   #foot surgery - is suppose to have home health - told to change bandage daily - trying to get supplies for insurance to pay for - has changed the bandage daily - thinks it is improving - worse when it sticks to the skin - advised saline solution - needs a paper script for medical supply - feeling better  - continues antibiotics until Tuesday -   Review of Systems  Skin:  Positive for rash.   6/1-03/13/2022: Admission - Foot infection - debridement and Abx.  - seeing ortho on 6/12  Social History   Tobacco Use  Smoking Status Never  Smokeless Tobacco Never        Objective:    BP Readings from Last 3 Encounters:  03/17/22 124/78  03/13/22 (!) 137/58  03/09/22 140/68   Wt Readings from Last 3 Encounters:  03/17/22 231 lb (104.8 kg)  03/10/22 235 lb (106.6 kg)  03/09/22 236 lb 5 oz (107.2 kg)    BP 124/78   Pulse 86   Temp (!) 97.3 F (36.3 C) (Temporal)   Ht 5' 3.5" (1.613 m)   Wt 231 lb (104.8 kg)   SpO2 97%   BMI 40.28 kg/m    Physical Exam Constitutional:      General: She is not in acute distress.    Appearance: She is well-developed. She is not diaphoretic.  HENT:     Right Ear: External ear normal.     Left Ear: External ear normal.     Nose: Nose normal.  Eyes:     Conjunctiva/sclera: Conjunctivae normal.  Cardiovascular:     Rate and Rhythm: Normal rate.  Pulmonary:     Effort: Pulmonary effort is normal.  Musculoskeletal:     Cervical back: Neck supple.  Skin:    General: Skin is warm and dry.     Capillary Refill: Capillary refill takes less than 2 seconds.     Comments: Erythema with satellite lesions under both breasts  Neurological:     Mental Status: She  is alert. Mental status is at baseline.  Psychiatric:        Mood and Affect: Mood normal.        Behavior: Behavior normal.           Assessment & Plan:   Problem List Items Addressed This Visit       Cardiovascular and Mediastinum   Essential hypertension    Controlled. Cont losartan 50 mg. Check labs.         Endocrine   Diabetic foot infection ([place]) - Primary    Wound exposed. Seems to be healing compared to home photos. No swelling or erythema. Cont abx (bactrim). Cont plan to see ortho. Prescription for wound care supplies.       Relevant Medications   fluconazole (DIFLUCAN) 150 MG tablet   clotrimazole-betamethasone (LOTRISONE) cream   Other Relevant Orders   Basic metabolic panel   CBC     Musculoskeletal and Integument   Candidal intertrigo    Suspect 2/2 to recent course of abx. Topical treatment. Update if not improving.       Relevant Medications   fluconazole (  DIFLUCAN) 150 MG tablet   clotrimazole-betamethasone (LOTRISONE) cream     Other   Cellulitis and abscess of foot   Relevant Orders   Basic metabolic panel   CBC   Hypocalcemia    In setting of infection. Recheck labs      Other Visit Diagnoses     Antibiotic-induced yeast infection       Relevant Medications   fluconazole (DIFLUCAN) 150 MG tablet   clotrimazole-betamethasone (LOTRISONE) cream        Return for as planned for diabetes.  Lesleigh Noe, MD

## 2022-03-17 NOTE — Assessment & Plan Note (Signed)
Suspect 2/2 to recent course of abx. Topical treatment. Update if not improving.

## 2022-03-17 NOTE — Assessment & Plan Note (Signed)
Controlled. Cont losartan 50 mg. Check labs.

## 2022-03-17 NOTE — Telephone Encounter (Signed)
Transition Care Management Follow-up Telephone Call Date of discharge and from where: 03/13/22 [facility] How have you been since you were released from the hospital? "Not bad. Unable to put pressure on foot. I am using a scooter for ambulation" Any questions or concerns? No  Items Reviewed: Did the pt receive and understand the discharge instructions provided? Yes  Medications obtained and verified? Yes  Other?  N/a Any new allergies since your discharge? No  Dietary orders reviewed? Yes Carb controlled Do you have support at home? Yes   Home Care and Equipment/Supplies: Were home health services ordered? no If so, what is the name of the agency? N/a  Has the agency set up a time to come to the patient's home? not applicable Were any new equipment or medical supplies ordered?  No ( patient  will be following with primary care provider today regarding wound dressing supplies) What is the name of the medical supply agency? N/a Were you able to get the supplies/equipment? Patient states she is seeing her primary care provider today and will discuss obtaining additional wound care supplies Do you have any questions related to the use of the equipment or supplies? No  Functional Questionnaire: (I = Independent and D = Dependent) ADLs: I  Bathing/Dressing- I with assistance   Meal Prep- I  Eating- I  Maintaining continence- I  Transferring/Ambulation- I  Managing Meds- I  Follow up appointments reviewed:  PCP Hospital f/u appt confirmed? Yes  Scheduled to see Dr. Waunita Schooner on 03/17/22 @ 12:20 pm. [facility] f/u appt confirmed? Yes  Scheduled to see Dr. Meridee Score on 03/20/22 @ 9:45 am. Are transportation arrangements needed? No  If their condition worsens, is the pt aware to call PCP or go to the Emergency Dept.? Yes Was the patient provided with contact information for the PCP's office or ED? Yes Was to pt encouraged to call back with questions or concerns? Yes   Quinn Plowman RN,BSN,CCM RN Case Manager [facility]  [phone]

## 2022-03-17 NOTE — Patient Instructions (Signed)
Fluconazole for vaginal yeast infection  Topical ointment for the skin infection  Continue antibiotics Continue dressing changes  Follow-up with ortho  Labs today

## 2022-03-20 ENCOUNTER — Inpatient Hospital Stay: Payer: 59 | Admitting: Family Medicine

## 2022-03-20 ENCOUNTER — Ambulatory Visit (INDEPENDENT_AMBULATORY_CARE_PROVIDER_SITE_OTHER): Payer: 59 | Admitting: Orthopedic Surgery

## 2022-03-20 ENCOUNTER — Encounter: Payer: Self-pay | Admitting: Orthopedic Surgery

## 2022-03-20 DIAGNOSIS — L97521 Non-pressure chronic ulcer of other part of left foot limited to breakdown of skin: Secondary | ICD-10-CM

## 2022-03-20 NOTE — Progress Notes (Signed)
Office Visit Note   Patient: April Cooper           Date of Birth: 1966/01/11           MRN: [id number] Visit Date: 03/20/2022              Requested by: Lesleigh Noe, MD [address],  [place] [address] PCP: Lesleigh Noe, MD  Chief Complaint  Patient presents with   Left Foot - Routine Post Op      HPI: Patient is a 56 year old woman who is about a week status post debridement left foot first webspace.  Patient is nonweightbearing with a kneeling scooter postoperative shoe she will finish her course of Bactrim DS tomorrow.  Patient states she was told by her primary care physician to use nonstick gauze in the first webspace.  Assessment & Plan: Visit Diagnoses:  1. Non-pressure chronic ulcer of other part of left foot limited to breakdown of skin ([place])     Plan: Recommended patient use a piece of the Vive wear sock to work open the first webspace change this daily wash her foot with soap and water daily.  Follow-Up Instructions: Return in about 1 week (around 03/27/2022).   Ortho Exam  Patient is alert, oriented, no adenopathy, well-dressed, normal affect, normal respiratory effort. Examination patient has increased maceration of the first webspace from using nonstick gauze.  There is no cellulitis no purulent drainage no signs of infection.   Imaging: No results found. No images are attached to the encounter.  Labs: Lab Results  Component Value Date   HGBA1C 7.0 (A) 01/02/2022   HGBA1C 6.9 (H) 07/19/2021   HGBA1C 7.0 (A) 03/08/2021   ESRSEDRATE 16 03/09/2022   CRP 0.9 03/09/2022   REPTSTATUS 03/15/2022 FINAL 03/10/2022   GRAMSTAIN NO WBC SEEN NO ORGANISMS SEEN  03/10/2022   CULT  03/10/2022    FEW STAPHYLOCOCCUS EPIDERMIDIS NO ANAEROBES ISOLATED Performed at [facility], [place] [address], [place], [place] [address]    LABORGA STAPHYLOCOCCUS EPIDERMIDIS 03/10/2022     Lab Results  Component Value Date   ALBUMIN 3.9 03/09/2022    ALBUMIN 4.2 07/19/2021   ALBUMIN 4.1 11/12/2019    No results found for: "MG" Lab Results  Component Value Date   VD25OH 20.54 (L) 07/19/2021   VD25OH 24.81 (L) 08/03/2020   VD25OH 27.48 (L) 11/12/2019    No results found for: "PREALBUMIN"    Latest Ref Rng & Units 03/17/2022    1:05 PM 03/11/2022    2:11 AM 03/10/2022    6:31 AM  CBC EXTENDED  WBC 4.0 - 10.5 K/uL 8.6  8.6  6.1   RBC 3.87 - 5.11 Mil/uL 4.97  4.64  4.45   Hemoglobin 12.0 - 15.0 g/dL 14.0  13.3  12.5   HCT 36.0 - 46.0 % 42.9  40.5  39.1   Platelets 150.0 - 400.0 K/uL 368.0  310  275      There is no height or weight on file to calculate BMI.  Orders:  No orders of the defined types were placed in this encounter.  No orders of the defined types were placed in this encounter.    Procedures: No procedures performed  Clinical Data: No additional findings.  ROS:  All other systems negative, except as noted in the HPI. Review of Systems  Objective: Vital Signs: There were no vitals taken for this visit.  Specialty Comments:  No specialty comments available.  PMFS History: Patient Active Problem List   Diagnosis Date Noted   Candidal intertrigo 03/17/2022   Hypocalcemia 03/17/2022   Diabetic foot infection ([place])    Cellulitis and abscess of foot 03/09/2022   Cellulitis of left lower extremity 03/07/2022   Essential hypertension 08/03/2020   Class 3 severe obesity due to excess calories with serious comorbidity and body mass index (BMI) of 40.0 to 44.9 in adult [facility]) 08/21/2019   Headache 08/21/2019   Back pain at L4-L5 level 02/06/2018   Hyperlipidemia associated with type 2 diabetes mellitus ([place]) 11/27/2017   Controlled type 2 diabetes mellitus without complication, without long-term current use of insulin ([place]) 08/27/2017   S/P endometrial ablation 04/08/2012   Vitamin D deficiency    Obesity 09/28/2010   Past Medical History:  Diagnosis Date   AMA (advanced maternal age) multigravida 35+     Diabetes mellitus without complication ([place])    Ectopic pregnancy    Hyperlipidemia    Hyperlipidemia    Hyperlipidemia    Hypertension    Menometrorrhagia 01/2004, 12/2011   Nephrolithiasis    OBESITY    Penicillin allergy 11/04/2002   Vitamin D deficiency    Vulvovaginal candidiasis 07/2006    Family History  Problem Relation Age of Onset   Hypertension Mother    Hypertension Father    Diabetes Sister    Diabetes Maternal Aunt    Hypertension Maternal Grandmother    Breast cancer Paternal Aunt 75   Breast cancer Paternal Grandmother    Colon cancer Neg Hx     Past Surgical History:  Procedure Laterality Date   ABLATION  2013   CYSTOSCOPY WITH RETROGRADE PYELOGRAM, URETEROSCOPY AND STENT PLACEMENT Right 01/16/2013   Procedure: CYSTOSCOPY WITH RETROGRADE PYELOGRAM, URETEROSCOPY AND STENT PLACEMENT;  Surgeon: Dutch Gray, MD;  Location: [facility];  Service: Urology;  Laterality: Right;   HOLMIUM LASER APPLICATION Right 0/35/4656   Procedure: HOLMIUM LASER APPLICATION;  Surgeon: Dutch Gray, MD;  Location: [facility];  Service: Urology;  Laterality: Right;   I & D EXTREMITY Left 03/10/2022   Procedure: LEFT FOOT DEBRIDEMENT;  Surgeon: Newt Minion, MD;  Location: [facility];  Service: Orthopedics;  Laterality: Left;   TUBAL LIGATION  2004   TUBAL LIGATION  2004   Social History   Occupational History   Not on file  Tobacco Use   Smoking status: Never   Smokeless tobacco: Never  Vaping Use   Vaping Use: Never used  Substance and Sexual Activity   Alcohol use: Yes    Comment: twice monthly   Drug use: No   Sexual activity: Not Currently    Birth control/protection: Surgical

## 2022-03-27 ENCOUNTER — Encounter: Payer: Self-pay | Admitting: Orthopedic Surgery

## 2022-03-27 ENCOUNTER — Ambulatory Visit (INDEPENDENT_AMBULATORY_CARE_PROVIDER_SITE_OTHER): Payer: 59 | Admitting: Orthopedic Surgery

## 2022-03-27 ENCOUNTER — Encounter: Payer: Self-pay | Admitting: Family Medicine

## 2022-03-27 DIAGNOSIS — L97521 Non-pressure chronic ulcer of other part of left foot limited to breakdown of skin: Secondary | ICD-10-CM

## 2022-03-27 NOTE — Progress Notes (Signed)
Office Visit Note   Patient: April Cooper           Date of Birth: [birth year]           MRN: [id number] Visit Date: 03/27/2022              Requested by: Lesleigh Noe, MD [place],  [place] [id number] PCP: Lesleigh Noe, MD  Chief Complaint  Patient presents with   Left Foot - Routine Post Op    03/10/2022 left foot debridement       HPI: Patient is a 56 year old woman who is status post debridement abscess left foot first webspace.  She has been using a piece of Vive sock in the first webspace and the wound is almost completely healed.  She states she still feels like she has some swelling she has been in a postoperative shoe and a kneeling scooter.  Assessment & Plan: Visit Diagnoses:  1. Non-pressure chronic ulcer of other part of left foot limited to breakdown of skin (HCC)     Plan: Continue using the Vive sock to wick away any drainage.  Increase her weightbearing as tolerated wean out of the postoperative shoe as tolerated.  Follow-Up Instructions: Return in about 4 weeks (around 04/24/2022).   Ortho Exam  Patient is alert, oriented, no adenopathy, well-dressed, normal affect, normal respiratory effort. Examination there is very slight maceration of the first webspace there is complete healing of all the wounds there is 1 small 1 mm in diameter open area with granulation tissue there is no cellulitis no signs of infection  Imaging: No results found. No images are attached to the encounter.  Labs: Lab Results  Component Value Date   HGBA1C 7.0 (A) 01/02/2022   HGBA1C 6.9 (H) 07/19/2021   HGBA1C 7.0 (A) 03/08/2021   ESRSEDRATE 16 03/09/2022   CRP 0.9 03/09/2022   REPTSTATUS 03/15/2022 FINAL 03/10/2022   GRAMSTAIN NO WBC SEEN NO ORGANISMS SEEN  03/10/2022   CULT  03/10/2022    FEW STAPHYLOCOCCUS EPIDERMIDIS NO ANAEROBES ISOLATED Performed at [facility], [place] [address] [place], [place] [address]    LABORGA STAPHYLOCOCCUS  EPIDERMIDIS 03/10/2022     Lab Results  Component Value Date   ALBUMIN 3.9 03/09/2022   ALBUMIN 4.2 07/19/2021   ALBUMIN 4.1 11/12/2019    No results found for: "MG" Lab Results  Component Value Date   VD25OH 20.54 (L) 07/19/2021   VD25OH 24.81 (L) 08/03/2020   VD25OH 27.48 (L) 11/12/2019    No results found for: "PREALBUMIN"    Latest Ref Rng & Units 03/17/2022    1:05 PM 03/11/2022    2:11 AM 03/10/2022    6:31 AM  CBC EXTENDED  WBC 4.0 - 10.5 K/uL 8.6  8.6  6.1   RBC 3.87 - 5.11 Mil/uL 4.97  4.64  4.45   Hemoglobin 12.0 - 15.0 g/dL 14.0  13.3  12.5   HCT 36.0 - 46.0 % 42.9  40.5  39.1   Platelets 150.0 - 400.0 K/uL 368.0  310  275      There is no height or weight on file to calculate BMI.  Orders:  No orders of the defined types were placed in this encounter.  No orders of the defined types were placed in this encounter.    Procedures: No procedures performed  Clinical Data: No additional findings.  ROS:  All other systems negative, except as noted in the HPI. Review of  Systems  Objective: Vital Signs: There were no vitals taken for this visit.  Specialty Comments:  No specialty comments available.  PMFS History: Patient Active Problem List   Diagnosis Date Noted   Candidal intertrigo 03/17/2022   Hypocalcemia 03/17/2022   Diabetic foot infection ([place])    Cellulitis and abscess of foot 03/09/2022   Cellulitis of left lower extremity 03/07/2022   Essential hypertension 08/03/2020   Class 3 severe obesity due to excess calories with serious comorbidity and body mass index (BMI) of 40.0 to 44.9 in adult [facility]) 08/21/2019   Headache 08/21/2019   Back pain at L4-L5 level 02/06/2018   Hyperlipidemia associated with type 2 diabetes mellitus ([place]) 11/27/2017   Controlled type 2 diabetes mellitus without complication, without long-term current use of insulin ([place]) 08/27/2017   S/P endometrial ablation 04/08/2012   Vitamin D deficiency    Obesity  09/28/2010   Past Medical History:  Diagnosis Date   AMA (advanced maternal age) multigravida 35+    Diabetes mellitus without complication ([place])    Ectopic pregnancy    Hyperlipidemia    Hyperlipidemia    Hyperlipidemia    Hypertension    Menometrorrhagia 01/2004, 12/2011   Nephrolithiasis    OBESITY    Penicillin allergy 11/04/2002   Vitamin D deficiency    Vulvovaginal candidiasis 07/2006    Family History  Problem Relation Age of Onset   Hypertension Mother    Hypertension Father    Diabetes Sister    Diabetes Maternal Aunt    Hypertension Maternal Grandmother    Breast cancer Paternal Aunt 62   Breast cancer Paternal Grandmother    Colon cancer Neg Hx     Past Surgical History:  Procedure Laterality Date   ABLATION  2013   CYSTOSCOPY WITH RETROGRADE PYELOGRAM, URETEROSCOPY AND STENT PLACEMENT Right 01/16/2013   Procedure: CYSTOSCOPY WITH RETROGRADE PYELOGRAM, URETEROSCOPY AND STENT PLACEMENT;  Surgeon: Dutch Gray, MD;  [facility];  Service: Urology;  Laterality: Right;   HOLMIUM LASER APPLICATION Right 5/36/6440   Procedure: HOLMIUM LASER APPLICATION;  Surgeon: Dutch Gray, MD;  Location[facility];  Service: Urology;  Laterality: Right;   I & D EXTREMITY Left 03/10/2022   Procedure: LEFT FOOT DEBRIDEMENT;  Surgeon: Newt Minion, MD;  Location: [facility];  Service: Orthopedics;  Laterality: Left;   TUBAL LIGATION  2004   TUBAL LIGATION  2004   Social History   Occupational History   Not on file  Tobacco Use   Smoking status: Never   Smokeless tobacco: Never  Vaping Use   Vaping Use: Never used  Substance and Sexual Activity   Alcohol use: Yes    Comment: twice monthly   Drug use: No   Sexual activity: Not Currently    Birth control/protection: Surgical

## 2022-04-05 ENCOUNTER — Telehealth: Payer: Self-pay

## 2022-04-05 ENCOUNTER — Ambulatory Visit: Payer: 59 | Admitting: Family Medicine

## 2022-04-05 VITALS — BP 126/70 | HR 58 | Temp 97.0°F | Ht 63.5 in | Wt 236.2 lb

## 2022-04-05 DIAGNOSIS — I10 Essential (primary) hypertension: Secondary | ICD-10-CM

## 2022-04-05 DIAGNOSIS — Z1211 Encounter for screening for malignant neoplasm of colon: Secondary | ICD-10-CM

## 2022-04-05 DIAGNOSIS — E119 Type 2 diabetes mellitus without complications: Secondary | ICD-10-CM | POA: Diagnosis not present

## 2022-04-05 LAB — POCT GLYCOSYLATED HEMOGLOBIN (HGB A1C): Hemoglobin A1C: 8.7 % — AB (ref 4.0–5.6)

## 2022-04-05 MED ORDER — METFORMIN HCL ER 500 MG PO TB24: 1000.0000 mg | ORAL_TABLET | Freq: Every day | ORAL | 1 refills | Status: DC

## 2022-04-05 MED ORDER — SEMAGLUTIDE(0.25 OR 0.5MG/DOS) 2 MG/3ML ~~LOC~~ SOPN: 0.5000 mg | PEN_INJECTOR | SUBCUTANEOUS | 0 refills | Status: DC

## 2022-04-05 NOTE — Assessment & Plan Note (Addendum)
Lab Results  Component Value Date   HGBA1C 8.7 (A) 08/02/8526   Complicated by difficulty tolerating medications due to GI side effects.  Will switch metformin to long-acting at 1000 mg daily.  Goal to increase to 2000 mg daily.  We will also restart Ozempic at last tolerated dose of 0.5 mg daily.  She will continue to work on diabetic diet.  Home monitoring and update in 1 month if sugars are not at goal for consideration for a third agent.  She is on losartan for high blood pressure and kidney protection.  Reviewed that last kidney function was normal including no signs of protein in the urine.

## 2022-04-05 NOTE — Progress Notes (Signed)
Subjective:     April Cooper is a 56 y.o. female presenting for Follow-up (61moDM)     HPI  #Diabetes Currently taking metformin 1000 mg daily Using medications without difficulties:  Hypoglycemic episodes:No  Hyperglycemic episodes:Yes  Feet problems: healing foot infection Blood Sugars averaging: 130-160, higher after eating Last HgbA1c:  Lab Results  Component Value Date   HGBA1C 8.7 (A) 04/05/2022    Diabetes Health Maintenance Due:    Diabetes Health Maintenance Due  Topic Date Due   OPHTHALMOLOGY EXAM  06/09/2022   HEMOGLOBIN A1C  10/05/2022   FOOT EXAM  01/03/2023   Foot ulcer - improving - still seeing ortho - able to bear weight now   Review of Systems   Social History   Tobacco Use  Smoking Status Never  Smokeless Tobacco Never        Objective:    BP Readings from Last 3 Encounters:  04/05/22 126/70  03/17/22 124/78  03/13/22 (!) 137/58   Wt Readings from Last 3 Encounters:  04/05/22 236 lb 4 oz (107.2 kg)  03/17/22 231 lb (104.8 kg)  03/10/22 235 lb (106.6 kg)    BP 126/70   Pulse (!) 58   Temp (!) 97 F (36.1 C) (Temporal)   Ht 5' 3.5" (1.613 m)   Wt 236 lb 4 oz (107.2 kg)   SpO2 97%   BMI 41.19 kg/m    Physical Exam Constitutional:      General: She is not in acute distress.    Appearance: She is well-developed. She is not diaphoretic.  HENT:     Right Ear: External ear normal.     Left Ear: External ear normal.     Nose: Nose normal.  Eyes:     Conjunctiva/sclera: Conjunctivae normal.  Cardiovascular:     Rate and Rhythm: Normal rate and regular rhythm.  Pulmonary:     Effort: Pulmonary effort is normal. No respiratory distress.     Breath sounds: Normal breath sounds. No wheezing.  Musculoskeletal:     Cervical back: Neck supple.  Skin:    General: Skin is warm and dry.     Capillary Refill: Capillary refill takes less than 2 seconds.     Comments: Newly healing skin on the left foot  Neurological:      Mental Status: She is alert. Mental status is at baseline.  Psychiatric:        Mood and Affect: Mood normal.        Behavior: Behavior normal.           Assessment & Plan:   Problem List Items Addressed This Visit       Cardiovascular and Mediastinum   Essential hypertension    Pressure at goal, continue losartan 50 mg daily        Endocrine   Controlled type 2 diabetes mellitus without complication, without long-term current use of insulin (HCC) - Primary    Lab Results  Component Value Date   HGBA1C 8.7 (A) 040/98/1191 Complicated by difficulty tolerating medications due to GI side effects.  Will switch metformin to long-acting at 1000 mg daily.  Goal to increase to 2000 mg daily.  We will also restart Ozempic at last tolerated dose of 0.5 mg daily.  She will continue to work on diabetic diet.  Home monitoring and update in 1 month if sugars are not at goal for consideration for a third agent.  She is on losartan for high blood pressure  and kidney protection.  Reviewed that last kidney function was normal including no signs of protein in the urine.      Relevant Medications   metFORMIN (GLUCOPHAGE-XR) 500 MG 24 hr tablet   Semaglutide,0.25 or 0.'5MG'$ /DOS, 2 MG/3ML SOPN   Other Relevant Orders   POCT glycosylated hemoglobin (Hb A1C) (Completed)   Other Visit Diagnoses     Colon cancer screening       Relevant Orders   Ambulatory referral to Gastroenterology        Return in about 3 months (around 07/06/2022) for for Diabetes check in.  Lesleigh Noe, MD

## 2022-04-05 NOTE — Telephone Encounter (Signed)
Prior authorization approved for Ozempic (0.25 or 0.5 MG/DOSE) '2MG'$ /3ML pen-injectors. Curtis Sites (Key: [id number])  Rx #: [id number]  Per Cover My Meds:  This request has been approved using information available on the patient's profile.  Case [id number]; Status: Approved;Review Type:Prior Auth;Coverage Start Date:03/06/2022; Coverage End Date:04/05/2023

## 2022-04-05 NOTE — Assessment & Plan Note (Signed)
Pressure at goal, continue losartan 50 mg daily

## 2022-04-05 NOTE — Patient Instructions (Addendum)
Diabetes  Metformin Long acting Week 1: Take 1000 mg (2 tablets) with food Week 2: Take 1500 mg (3 tablets)  Week 3: Take 2000 mg (4 tablets)    Ozempic - can start with 0.5 mg  - if side effects reduce to 0.25 mg x 3-4 weeks and then try increasing again  Continue to work on diet   Check 2 hours after you eat - Goal <160 - update in 1 month with home glucose monitoring  - may need to add another medication

## 2022-04-24 ENCOUNTER — Encounter: Payer: Self-pay | Admitting: Orthopedic Surgery

## 2022-04-24 ENCOUNTER — Ambulatory Visit (INDEPENDENT_AMBULATORY_CARE_PROVIDER_SITE_OTHER): Payer: 59 | Admitting: Orthopedic Surgery

## 2022-04-24 DIAGNOSIS — L97521 Non-pressure chronic ulcer of other part of left foot limited to breakdown of skin: Secondary | ICD-10-CM

## 2022-04-24 NOTE — Progress Notes (Signed)
Office Visit Note   Patient: April Cooper           Date of Birth: 03/28/1966           MRN: [id number] Visit Date: 04/24/2022              Requested by: Lesleigh Noe, MD [place],  [place] [id number] PCP: Lesleigh Noe, MD  Chief Complaint  Patient presents with   Left Foot - Follow-up    Left foot debridement 03/10/2022      HPI: Patient is a 56 year old woman who presents 6 weeks status post debridement abscess first webspace left foot.  Assessment & Plan: Visit Diagnoses:  1. Non-pressure chronic ulcer of other part of left foot limited to breakdown of skin [facility])     Plan: Patient will advance her activities as tolerated.  No restrictions.  Follow-Up Instructions: Return if symptoms worsen or fail to improve.   Ortho Exam  Patient is alert, oriented, no adenopathy, well-dressed, normal affect, normal respiratory effort. Examination the incision over the first webspace has completely healed there is no redness no cellulitis no drainage no tenderness to palpation.  Imaging: No results found. No images are attached to the encounter.  Labs: Lab Results  Component Value Date   HGBA1C 8.7 (A) 04/05/2022   HGBA1C 7.0 (A) 01/02/2022   HGBA1C 6.9 (H) 07/19/2021   ESRSEDRATE 16 03/09/2022   CRP 0.9 03/09/2022   REPTSTATUS 03/15/2022 FINAL 03/10/2022   GRAMSTAIN NO WBC SEEN NO ORGANISMS SEEN  03/10/2022   CULT  03/10/2022    FEW STAPHYLOCOCCUS EPIDERMIDIS NO ANAEROBES ISOLATED Performed at [facility], [place] [address] [place], [place] [address]    LABORGA STAPHYLOCOCCUS EPIDERMIDIS 03/10/2022     Lab Results  Component Value Date   ALBUMIN 3.9 03/09/2022   ALBUMIN 4.2 07/19/2021   ALBUMIN 4.1 11/12/2019    No results found for: "MG" Lab Results  Component Value Date   VD25OH 20.54 (L) 07/19/2021   VD25OH 24.81 (L) 08/03/2020   VD25OH 27.48 (L) 11/12/2019    No results found for: "PREALBUMIN"    Latest Ref Rng & Units  03/17/2022    1:05 PM 03/11/2022    2:11 AM 03/10/2022    6:31 AM  CBC EXTENDED  WBC 4.0 - 10.5 K/uL 8.6  8.6  6.1   RBC 3.87 - 5.11 Mil/uL 4.97  4.64  4.45   Hemoglobin 12.0 - 15.0 g/dL 14.0  13.3  12.5   HCT 36.0 - 46.0 % 42.9  40.5  39.1   Platelets 150.0 - 400.0 K/uL 368.0  310  275      There is no height or weight on file to calculate BMI.  Orders:  No orders of the defined types were placed in this encounter.  No orders of the defined types were placed in this encounter.    Procedures: No procedures performed  Clinical Data: No additional findings.  ROS:  All other systems negative, except as noted in the HPI. Review of Systems  Objective: Vital Signs: There were no vitals taken for this visit.  Specialty Comments:  No specialty comments available.  PMFS History: Patient Active Problem List   Diagnosis Date Noted   Candidal intertrigo 03/17/2022   Hypocalcemia 03/17/2022   Diabetic foot infection ([place])    Cellulitis and abscess of foot 03/09/2022   Cellulitis of left lower extremity 03/07/2022   Essential hypertension 08/03/2020   Class 3  severe obesity due to excess calories with serious comorbidity and body mass index (BMI) of 40.0 to 44.9 in adult [facility]) 08/21/2019   Headache 08/21/2019   Back pain at L4-L5 level 02/06/2018   Hyperlipidemia associated with type 2 diabetes mellitus ([place]) 11/27/2017   Controlled type 2 diabetes mellitus without complication, without long-term current use of insulin ([place]) 08/27/2017   S/P endometrial ablation 04/08/2012   Vitamin D deficiency    Obesity 09/28/2010   Past Medical History:  Diagnosis Date   AMA (advanced maternal age) multigravida 35+    Diabetes mellitus without complication ([place])    Ectopic pregnancy    Hyperlipidemia    Hyperlipidemia    Hyperlipidemia    Hypertension    Menometrorrhagia 01/2004, 12/2011   Nephrolithiasis    OBESITY    Penicillin allergy 11/04/2002   Vitamin D deficiency     Vulvovaginal candidiasis 07/2006    Family History  Problem Relation Age of Onset   Hypertension Mother    Hypertension Father    Diabetes Sister    Diabetes Maternal Aunt    Hypertension Maternal Grandmother    Breast cancer Paternal Aunt 60   Breast cancer Paternal Grandmother    Colon cancer Neg Hx     Past Surgical History:  Procedure Laterality Date   ABLATION  2013   CYSTOSCOPY WITH RETROGRADE PYELOGRAM, URETEROSCOPY AND STENT PLACEMENT Right 01/16/2013   Procedure: CYSTOSCOPY WITH RETROGRADE PYELOGRAM, URETEROSCOPY AND STENT PLACEMENT;  Surgeon: Dutch Gray, MD;  [facility];  Service: Urology;  Laterality: Right;   HOLMIUM LASER APPLICATION Right 5/79/7282   Procedure: HOLMIUM LASER APPLICATION;  Surgeon: Dutch Gray, MD;  [facility];  Service: Urology;  Laterality: Right;   I & D EXTREMITY Left 03/10/2022   Procedure: LEFT FOOT DEBRIDEMENT;  Surgeon: Newt Minion, MD;  Location: [facility];  Service: Orthopedics;  Laterality: Left;   TUBAL LIGATION  2004   TUBAL LIGATION  2004   Social History   Occupational History   Not on file  Tobacco Use   Smoking status: Never   Smokeless tobacco: Never  Vaping Use   Vaping Use: Never used  Substance and Sexual Activity   Alcohol use: Yes    Comment: twice monthly   Drug use: No   Sexual activity: Not Currently    Birth control/protection: Surgical

## 2022-04-27 ENCOUNTER — Other Ambulatory Visit: Payer: Self-pay | Admitting: Family Medicine

## 2022-04-27 DIAGNOSIS — E119 Type 2 diabetes mellitus without complications: Secondary | ICD-10-CM

## 2022-05-01 ENCOUNTER — Encounter: Payer: Self-pay | Admitting: Internal Medicine

## 2022-07-05 ENCOUNTER — Encounter: Payer: Self-pay | Admitting: Family Medicine

## 2022-07-07 ENCOUNTER — Ambulatory Visit (INDEPENDENT_AMBULATORY_CARE_PROVIDER_SITE_OTHER): Payer: 59 | Admitting: Family Medicine

## 2022-07-07 ENCOUNTER — Encounter: Payer: Self-pay | Admitting: Family Medicine

## 2022-07-07 VITALS — BP 130/78 | HR 67 | Temp 97.0°F | Ht 63.5 in | Wt 236.5 lb

## 2022-07-07 DIAGNOSIS — Z23 Encounter for immunization: Secondary | ICD-10-CM | POA: Diagnosis not present

## 2022-07-07 DIAGNOSIS — E119 Type 2 diabetes mellitus without complications: Secondary | ICD-10-CM | POA: Diagnosis not present

## 2022-07-07 LAB — POCT GLYCOSYLATED HEMOGLOBIN (HGB A1C): Hemoglobin A1C: 8.6 % — AB (ref 4.0–5.6)

## 2022-07-07 MED ORDER — EMPAGLIFLOZIN 10 MG PO TABS: 10.0000 mg | ORAL_TABLET | Freq: Every day | ORAL | 0 refills | Status: DC

## 2022-07-07 MED ORDER — FREESTYLE LIBRE 3 SENSOR MISC: 3 refills | Status: DC

## 2022-07-07 NOTE — Assessment & Plan Note (Addendum)
Lab Results  Component Value Date   HGBA1C 8.6 (A) 07/07/2022   Not at goal. Offered nutrition consult as patient is eating carbohydrate heavy breakfasts though healthy.  She declined at this time, will try and get freestyle libre covered for closer monitoring to help guide dietary choices.  She will try to increase metformin to 150 mg and then 2000 if tolerated.  Continue Ozempic at 0.5 mg, has not been able to tolerate higher doses.  Start Jardiance 10 mg daily.

## 2022-07-07 NOTE — Progress Notes (Signed)
Subjective:     April Cooper is a 56 y.o. female presenting for Follow-up (3 mo- DM )     HPI  #Diabetes Currently taking metformin 1000 mg XR, ozempic 0.5 mg  Using medications without difficulties: Yes Hypoglycemic episodes:No  Hyperglycemic episodes:Yes  Feet problems:No  Blood Sugars averaging: post prandial <200, fasting 120-140 Last HgbA1c:  Lab Results  Component Value Date   HGBA1C 8.6 (A) 07/07/2022    Diabetes Health Maintenance Due:    Diabetes Health Maintenance Due  Topic Date Due   OPHTHALMOLOGY EXAM  06/09/2022   FOOT EXAM  01/03/2023   HEMOGLOBIN A1C  01/05/2023    Diet: off and on - has been trying to bring lunch to work, oatmeal/fruit morning, dinner is hard Exercise: walking, chair exercise   Review of Systems   Social History   Tobacco Use  Smoking Status Never  Smokeless Tobacco Never        Objective:    BP Readings from Last 3 Encounters:  07/07/22 130/78  04/05/22 126/70  03/17/22 124/78   Wt Readings from Last 3 Encounters:  07/07/22 236 lb 8 oz (107.3 kg)  04/05/22 236 lb 4 oz (107.2 kg)  03/17/22 231 lb (104.8 kg)    BP 130/78   Pulse 67   Temp (!) 97 F (36.1 C) (Temporal)   Ht 5' 3.5" (1.613 m)   Wt 236 lb 8 oz (107.3 kg)   SpO2 96%   BMI 41.24 kg/m    Physical Exam Constitutional:      General: She is not in acute distress.    Appearance: She is well-developed. She is not diaphoretic.  HENT:     Right Ear: External ear normal.     Left Ear: External ear normal.     Nose: Nose normal.  Eyes:     Conjunctiva/sclera: Conjunctivae normal.  Cardiovascular:     Rate and Rhythm: Normal rate and regular rhythm.     Heart sounds: No murmur heard. Pulmonary:     Effort: Pulmonary effort is normal. No respiratory distress.     Breath sounds: Normal breath sounds. No wheezing.  Musculoskeletal:     Cervical back: Neck supple.  Skin:    General: Skin is warm and dry.     Capillary Refill: Capillary  refill takes less than 2 seconds.  Neurological:     Mental Status: She is alert. Mental status is at baseline.  Psychiatric:        Mood and Affect: Mood normal.        Behavior: Behavior normal.           Assessment & Plan:   Problem List Items Addressed This Visit       Endocrine   Controlled type 2 diabetes mellitus without complication, without long-term current use of insulin ([place]) - Primary    Lab Results  Component Value Date   HGBA1C 8.6 (A) 07/07/2022  Not at goal. Offered nutrition consult as patient is eating carbohydrate heavy breakfasts though healthy.  She declined at this time, will try and get freestyle libre covered for closer monitoring to help guide dietary choices.  She will try to increase metformin to 150 mg and then 2000 if tolerated.  Continue Ozempic at 0.5 mg, has not been able to tolerate higher doses.  Start Jardiance 10 mg daily.      Relevant Medications   empagliflozin (JARDIANCE) 10 MG TABS tablet   Continuous Blood Gluc Sensor (FREESTYLE LIBRE 3  SENSOR) MISC   Other Relevant Orders   POCT glycosylated hemoglobin (Hb A1C) (Completed)   Other Visit Diagnoses     Need for influenza vaccination       Relevant Orders   Flu Vaccine QUAD 32moIM (Fluarix, Fluzone & Alfiuria Quad PF) (Completed)        Return in about 3 months (around 10/06/2022) for TOC with [facility]- TOC.  JLesleigh Noe MD

## 2022-07-07 NOTE — Patient Instructions (Signed)
Start Jardiance - side effective - UTIs and yeast infections   Metformin - increase to 3 pills  - if tolerated can go to 4 pills daily

## 2022-08-21 ENCOUNTER — Other Ambulatory Visit: Payer: Self-pay

## 2022-08-21 DIAGNOSIS — E119 Type 2 diabetes mellitus without complications: Secondary | ICD-10-CM

## 2022-08-21 MED ORDER — SEMAGLUTIDE(0.25 OR 0.5MG/DOS) 2 MG/3ML ~~LOC~~ SOPN: 0.5000 mg | PEN_INJECTOR | SUBCUTANEOUS | 0 refills | Status: DC

## 2022-09-06 ENCOUNTER — Other Ambulatory Visit: Payer: Self-pay

## 2022-09-06 MED ORDER — ROSUVASTATIN CALCIUM 5 MG PO TABS: 5.0000 mg | ORAL_TABLET | Freq: Every day | ORAL | 0 refills | Status: DC

## 2022-09-27 ENCOUNTER — Other Ambulatory Visit: Payer: Self-pay

## 2022-09-27 DIAGNOSIS — E119 Type 2 diabetes mellitus without complications: Secondary | ICD-10-CM

## 2022-09-27 MED ORDER — METFORMIN HCL ER 500 MG PO TB24: 1000.0000 mg | ORAL_TABLET | Freq: Every day | ORAL | 0 refills | Status: DC

## 2022-10-06 ENCOUNTER — Encounter: Payer: Self-pay | Admitting: Nurse Practitioner

## 2022-10-06 ENCOUNTER — Ambulatory Visit (INDEPENDENT_AMBULATORY_CARE_PROVIDER_SITE_OTHER): Payer: 59 | Admitting: Nurse Practitioner

## 2022-10-06 VITALS — BP 122/64 | HR 80 | Temp 98.7°F | Resp 16 | Ht 63.5 in | Wt 228.5 lb

## 2022-10-06 DIAGNOSIS — E1169 Type 2 diabetes mellitus with other specified complication: Secondary | ICD-10-CM | POA: Diagnosis not present

## 2022-10-06 DIAGNOSIS — Z6836 Body mass index (BMI) 36.0-36.9, adult: Secondary | ICD-10-CM

## 2022-10-06 DIAGNOSIS — L089 Local infection of the skin and subcutaneous tissue, unspecified: Secondary | ICD-10-CM

## 2022-10-06 DIAGNOSIS — E1165 Type 2 diabetes mellitus with hyperglycemia: Secondary | ICD-10-CM

## 2022-10-06 DIAGNOSIS — I10 Essential (primary) hypertension: Secondary | ICD-10-CM | POA: Diagnosis not present

## 2022-10-06 DIAGNOSIS — E11628 Type 2 diabetes mellitus with other skin complications: Secondary | ICD-10-CM

## 2022-10-06 DIAGNOSIS — E785 Hyperlipidemia, unspecified: Secondary | ICD-10-CM

## 2022-10-06 DIAGNOSIS — E119 Type 2 diabetes mellitus without complications: Secondary | ICD-10-CM | POA: Diagnosis not present

## 2022-10-06 LAB — COMPREHENSIVE METABOLIC PANEL
ALT: 22 U/L (ref 0–35)
AST: 19 U/L (ref 0–37)
Albumin: 4.1 g/dL (ref 3.5–5.2)
Alkaline Phosphatase: 51 U/L (ref 39–117)
BUN: 13 mg/dL (ref 6–23)
CO2: 28 mEq/L (ref 19–32)
Calcium: 9.4 mg/dL (ref 8.4–10.5)
Chloride: 105 mEq/L (ref 96–112)
Creatinine, Ser: 0.87 mg/dL (ref 0.40–1.20)
GFR: 73.56 mL/min (ref >60.00)
Glucose, Bld: 124 mg/dL — ABNORMAL HIGH (ref 70–99)
Potassium: 4.4 mEq/L (ref 3.5–5.1)
Sodium: 139 mEq/L (ref 135–145)
Total Bilirubin: 0.4 mg/dL (ref 0.2–1.2)
Total Protein: 7.3 g/dL (ref 6.0–8.3)

## 2022-10-06 LAB — POCT GLYCOSYLATED HEMOGLOBIN (HGB A1C): Hemoglobin A1C: 7.5 % — AB (ref 4.0–5.6)

## 2022-10-06 LAB — CBC
HCT: 43.5 % (ref 36.0–46.0)
Hemoglobin: 14.1 g/dL (ref 12.0–15.0)
MCHC: 32.5 g/dL (ref 30.0–36.0)
MCV: 87.3 fl (ref 78.0–100.0)
Platelets: 335 10*3/uL (ref 150.0–400.0)
RBC: 4.98 Mil/uL (ref 3.87–5.11)
RDW: 14.3 % (ref 11.5–15.5)
WBC: 6.5 10*3/uL (ref 4.0–10.5)

## 2022-10-06 MED ORDER — METFORMIN HCL ER 500 MG PO TB24: ORAL_TABLET | ORAL | 1 refills | Status: DC

## 2022-10-06 NOTE — Assessment & Plan Note (Signed)
Continue working on healthy lifestyle modifications.  Patient currently on rosuvastatin 5 mg daily

## 2022-10-06 NOTE — Assessment & Plan Note (Signed)
Continue working on last modifications

## 2022-10-06 NOTE — Assessment & Plan Note (Signed)
Patient had surgery and since healed.  She does regular foot checks at home

## 2022-10-06 NOTE — Progress Notes (Signed)
Established Patient Office Visit  Subjective   Patient ID: April Cooper, female    DOB: May 26, 1966  Age: 56 y.o. MRN: [id number]  Chief Complaint  Patient presents with   Establish Care    HPI 7.5  Transfer of Care: Last seen on 07/07/2022 by Waunita Schooner, MD Last CPE 07/19/2021  DM2: Patient currently maintained on Jardiance, Metformin, Ozempic 0.'5mg'$ . She does have a CGM. States that 113 in the am. Highest 130 in the am. During the day 180s depending on what she eats. States that she is watching her carbs. 3 meals a day. If she snacks it is a type of nut and sometimes dry fruit, yogurt. Exercise: she does walk a couple times a week outside of work   HTN: Currently maintained on losartan. Has a wrist cuff but does not check at home.  Diabetic foot infection: States it has healed. States that she had surgery on it. She does do at home foot checks  [place] and mammo  TDAP: 2021 Flu: 07/07/2022 Covid: Levan Hurst x2 and 1 booster Shingles: Shingrix UTD Pap smear Dr Everett Graff, Due 10/2022 Mammogram: managed through GYN Colonoscopy: 2018, recall in 5 years. Over due    Review of Systems  Constitutional:  Negative for chills and fever.  Respiratory:  Negative for shortness of breath.   Cardiovascular:  Negative for chest pain.  Gastrointestinal:  Negative for abdominal pain, blood in stool, constipation, diarrhea, nausea and vomiting.       BM daily   Genitourinary:  Negative for dysuria and hematuria.  Neurological:  Negative for tingling and headaches.  Psychiatric/Behavioral:  Negative for hallucinations and suicidal ideas.       Objective:     BP 122/64   Pulse 80   Temp 98.7 F (37.1 C)   Resp 16   Ht 5' 3.5" (1.613 m)   Wt 228 lb 8 oz (103.6 kg)   SpO2 98%   BMI 39.84 kg/m    Physical Exam Vitals and nursing note reviewed.  Constitutional:      Appearance: Normal appearance. She is obese.  HENT:     Right Ear: Tympanic  membrane, ear canal and external ear normal.     Left Ear: Tympanic membrane, ear canal and external ear normal.  Cardiovascular:     Rate and Rhythm: Normal rate and regular rhythm.     Pulses: Normal pulses.          Dorsalis pedis pulses are 2+ on the right side and 2+ on the left side.     Heart sounds: Normal heart sounds.  Pulmonary:     Effort: Pulmonary effort is normal.     Breath sounds: Normal breath sounds.  Musculoskeletal:     Right lower leg: No edema.     Left lower leg: No edema.       Feet:  Feet:     Right foot:     Skin integrity: Skin integrity normal.     Left foot:     Skin integrity: Skin integrity normal.  Lymphadenopathy:     Cervical: No cervical adenopathy.  Neurological:     Mental Status: She is alert.      Results for orders placed or performed in visit on 10/06/22  POCT glycosylated hemoglobin (Hb A1C)  Result Value Ref Range   Hemoglobin A1C 7.5 (A) 4.0 - 5.6 %   HbA1c POC (<> result, manual entry)     HbA1c, POC (prediabetic range)  HbA1c, POC (controlled diabetic range)        The 10-year ASCVD risk score (Arnett DK, et al., 2019) is: 9.4%    Assessment & Plan:   Problem List Items Addressed This Visit       Cardiovascular and Mediastinum   Essential hypertension   Relevant Orders   CBC   Comprehensive metabolic panel     Endocrine   Uncontrolled type 2 diabetes mellitus with hyperglycemia ([place]) - Primary    Patient currently maintained on metformin 1500 mg daily, Jardiance, Ozempic 0.5 mg.  Patient does not tolerate higher doses of Ozempic per her report.  We did increase metformin from 1000 mg daily to 1500 mg daily.  A1c of 7.5%.  Continue work on healthy lifestyle modifications.      Relevant Medications   metFORMIN (GLUCOPHAGE-XR) 500 MG 24 hr tablet   Hyperlipidemia associated with type 2 diabetes mellitus ([place])    Continue working on healthy lifestyle modifications.  Patient currently on rosuvastatin 5 mg  daily      Relevant Medications   metFORMIN (GLUCOPHAGE-XR) 500 MG 24 hr tablet   Diabetic foot infection [facility])    Patient had surgery and since healed.  She does regular foot checks at home      Relevant Medications   metFORMIN (GLUCOPHAGE-XR) 500 MG 24 hr tablet     Other   Obesity    Continue working on last modifications      Relevant Medications   metFORMIN (GLUCOPHAGE-XR) 500 MG 24 hr tablet    Return in about 3 months (around 01/05/2023) for DM recheck .    Romilda Garret, NP

## 2022-10-06 NOTE — Patient Instructions (Addendum)
Nice to see you today We will increase the metformin to 2 tablets in the morning and 1 tablet in the evening. Follow up with me in 3 months for a recheck on your diabetes, sooner if you need me  Call Dr Vena Rua office to schedule a colonoscopy    [address] [place], [place]  [address] Phone:  [phone]   Fax:  [phone]

## 2022-10-06 NOTE — Assessment & Plan Note (Signed)
Patient currently maintained on metformin 1500 mg daily, Jardiance, Ozempic 0.5 mg.  Patient does not tolerate higher doses of Ozempic per her report.  We did increase metformin from 1000 mg daily to 1500 mg daily.  A1c of 7.5%.  Continue work on healthy lifestyle modifications.

## 2022-10-12 ENCOUNTER — Other Ambulatory Visit: Payer: Self-pay

## 2022-10-12 DIAGNOSIS — E119 Type 2 diabetes mellitus without complications: Secondary | ICD-10-CM

## 2022-10-13 ENCOUNTER — Encounter: Payer: Self-pay | Admitting: Nurse Practitioner

## 2022-10-13 MED ORDER — EMPAGLIFLOZIN 10 MG PO TABS: 10.0000 mg | ORAL_TABLET | Freq: Every day | ORAL | 0 refills | Status: DC

## 2022-10-23 ENCOUNTER — Telehealth (INDEPENDENT_AMBULATORY_CARE_PROVIDER_SITE_OTHER): Payer: 59 | Admitting: Nurse Practitioner

## 2022-10-23 ENCOUNTER — Encounter: Payer: Self-pay | Admitting: Nurse Practitioner

## 2022-10-23 VITALS — Ht 63.5 in | Wt 228.0 lb

## 2022-10-23 DIAGNOSIS — U071 COVID-19: Secondary | ICD-10-CM | POA: Diagnosis not present

## 2022-10-23 DIAGNOSIS — N8000 Endometriosis of the uterus, unspecified: Secondary | ICD-10-CM | POA: Insufficient documentation

## 2022-10-23 DIAGNOSIS — N939 Abnormal uterine and vaginal bleeding, unspecified: Secondary | ICD-10-CM | POA: Insufficient documentation

## 2022-10-23 MED ORDER — NIRMATRELVIR/RITONAVIR (PAXLOVID)TABLET: 3.0000 | ORAL_TABLET | Freq: Two times a day (BID) | ORAL | 0 refills | Status: AC

## 2022-10-23 NOTE — Assessment & Plan Note (Signed)
Positive for COVID-19 with an at home test.  Did discuss antiviral treatments today are both EUA only.  After joint discussion decided to pursue Paxlovid.  Patient will hold rosuvastatin for 2 weeks while on Paxlovid.  Did discuss CDC recommendations/guidelines in regards to self-isolation/quarantine.  Did discuss signs and symptoms when to be seen urgent or emergently.  Did discuss common side effects of using Paxlovid medication.  Work note sent electronically with guideline dates and recommendations from [facility].

## 2022-10-23 NOTE — Patient Instructions (Signed)
DO NOT take the Crestor (rosuvastatin) for 2 weeks. You need to quarantine through 10/26/2022. Then on 10/27/2022 if you have been fever free for 24 hours without the use of fever reducing medications you can return to work but need to wear a mask around others through 10/31/2022

## 2022-10-23 NOTE — Progress Notes (Signed)
Patient ID: April Cooper, female    DOB: [birth year], 57 y.o.   MRN: [id number]  Virtual visit completed through [employer] , a video enabled telemedicine application. Due to national recommendations of social distancing due to COVID-19, a virtual visit is felt to be most appropriate for this patient at this time. Reviewed limitations, risks, security and privacy concerns of performing a virtual visit and the availability of in person appointments. I also reviewed that there may be a patient responsible charge related to this service. The patient agreed to proceed.   Patient location: home Provider location: [place] [facility], office Persons participating in this virtual visit: patient, provider   If any vitals were documented, they were collected by patient at home unless specified below.    Ht 5' 3.5" (1.613 m)   Wt 228 lb (103.4 kg)   BMI 39.75 kg/m    CC: covid 19 Subjective:   HPI: April Cooper is a 57 y.o. female presenting on 10/23/2022 for COVID+ (Sunday positive), Sore Throat (x3d), Nasal Congestion (x3d), Cough (x3d), and Generalized Body Aches (x2d)    Symptoms started on 10/21/2022  Covid positive on Sunday Covid vaccines are up to date Flu vaccine is up to day States that she was at a sporting event States that she is taking mucinex cough medication and tylenol.  With some help    Relevant past medical, surgical, family and social history reviewed and updated as indicated. Interim medical history since our last visit reviewed. Allergies and medications reviewed and updated. Outpatient Medications Prior to Visit  Medication Sig Dispense Refill   Accu-Chek FastClix Lancets MISC 1 each by Does not apply route in the morning, at noon, and at bedtime. 102 each 3   ACCU-CHEK GUIDE test strip USE UP TO 4 TIMES A DAY AS DIRECTED 100 strip 3   blood glucose meter kit and supplies Dispense based on patient and insurance preference. Use up to four times daily  as directed. (FOR ICD-10 E10.9, E11.9). 1 each 0   cholecalciferol (VITAMIN D3) 25 MCG (1000 UNIT) tablet Take 2,000 Units by mouth daily.     clotrimazole-betamethasone (LOTRISONE) cream Apply 1 application  topically daily. 30 g 0   Continuous Blood Gluc Sensor (FREESTYLE LIBRE 3 SENSOR) MISC Apply sensor every 14 days. 2 each 3   empagliflozin (JARDIANCE) 10 MG TABS tablet Take 1 tablet (10 mg total) by mouth daily before breakfast. 90 tablet 0   ibuprofen (ADVIL) 200 MG tablet Take 600 mg by mouth every 6 (six) hours as needed for moderate pain.     latanoprost (XALATAN) 0.005 % ophthalmic solution Place 1 drop into both eyes at bedtime.     losartan (COZAAR) 50 MG tablet Take 1 tablet (50 mg total) by mouth daily. 90 tablet 3   metFORMIN (GLUCOPHAGE-XR) 500 MG 24 hr tablet Take 2 tablets (1,'000mg'$ ) in the morning and 1 tablet ('500mg'$ ) in the evening 270 tablet 1   rosuvastatin (CRESTOR) 5 MG tablet Take 1 tablet (5 mg total) by mouth daily. Will need to be seen in office for more refills. 90 tablet 0   Semaglutide,0.25 or 0.'5MG'$ /DOS, 2 MG/3ML SOPN Inject 0.5 mg into the skin once a week. 9 mL 0   No facility-administered medications prior to visit.     Per HPI unless specifically indicated in ROS section below Review of Systems  Constitutional:  Negative for appetite change, chills, fatigue and fever.  HENT:  Positive for ear pain and  sore throat.   Respiratory:  Positive for cough. Negative for shortness of breath.   Gastrointestinal:  Negative for abdominal pain, diarrhea and nausea.  Musculoskeletal:  Positive for arthralgias and myalgias.  Neurological:  Positive for headaches.   Objective:  Ht 5' 3.5" (1.613 m)   Wt 228 lb (103.4 kg)   BMI 39.75 kg/m   Wt Readings from Last 3 Encounters:  10/23/22 228 lb (103.4 kg)  10/06/22 228 lb 8 oz (103.6 kg)  07/07/22 236 lb 8 oz (107.3 kg)       Physical exam: Gen: alert, NAD, not ill appearing Pulm: speaks in complete sentences  without increased work of breathing Psych: normal mood, normal thought content      Results for orders placed or performed in visit on 10/06/22  CBC  Result Value Ref Range   WBC 6.5 4.0 - 10.5 K/uL   RBC 4.98 3.87 - 5.11 Mil/uL   Platelets 335.0 150.0 - 400.0 K/uL   Hemoglobin 14.1 12.0 - 15.0 g/dL   HCT 43.5 36.0 - 46.0 %   MCV 87.3 78.0 - 100.0 fl   MCHC 32.5 30.0 - 36.0 g/dL   RDW 14.3 11.5 - 15.5 %  Comprehensive metabolic panel  Result Value Ref Range   Sodium 139 135 - 145 mEq/L   Potassium 4.4 3.5 - 5.1 mEq/L   Chloride 105 96 - 112 mEq/L   CO2 28 19 - 32 mEq/L   Glucose, Bld 124 (H) 70 - 99 mg/dL   BUN 13 6 - 23 mg/dL   Creatinine, Ser 0.87 0.40 - 1.20 mg/dL   Total Bilirubin 0.4 0.2 - 1.2 mg/dL   Alkaline Phosphatase 51 39 - 117 U/L   AST 19 0 - 37 U/L   ALT 22 0 - 35 U/L   Total Protein 7.3 6.0 - 8.3 g/dL   Albumin 4.1 3.5 - 5.2 g/dL   GFR 73.56 >60.00 mL/min   Calcium 9.4 8.4 - 10.5 mg/dL  POCT glycosylated hemoglobin (Hb A1C)  Result Value Ref Range   Hemoglobin A1C 7.5 (A) 4.0 - 5.6 %   HbA1c POC (<> result, manual entry)     HbA1c, POC (prediabetic range)     HbA1c, POC (controlled diabetic range)     Assessment & Plan:   COVID-19 Assessment & Plan: Positive for COVID-19 with an at home test.  Did discuss antiviral treatments today are both EUA only.  After joint discussion decided to pursue Paxlovid.  Patient will hold rosuvastatin for 2 weeks while on Paxlovid.  Did discuss CDC recommendations/guidelines in regards to self-isolation/quarantine.  Did discuss signs and symptoms when to be seen urgent or emergently.  Did discuss common side effects of using Paxlovid medication.  Work note sent electronically with guideline dates and recommendations from [facility].   Other orders -     nirmatrelvir/ritonavir; Take 3 tablets by mouth 2 (two) times daily for 5 days. (Take nirmatrelvir 150 mg two tablets twice daily for 5 days and ritonavir 100 mg one tablet twice  daily for 5 days) Patient GFR is 73.56. DO not take Crestor for 2 weeks  Dispense: 30 tablet; Refill: 0     I discussed the assessment and treatment plan with the patient. The patient was provided an opportunity to ask questions and all were answered. The patient agreed with the plan and demonstrated an understanding of the instructions. The patient was advised to call back or seek an in-person evaluation if the symptoms worsen or if  the condition fails to improve as anticipated.  Follow up plan: Return if symptoms worsen or fail to improve.  Romilda Garret, NP

## 2022-12-29 ENCOUNTER — Ambulatory Visit: Payer: 59 | Admitting: Nurse Practitioner

## 2022-12-29 ENCOUNTER — Encounter: Payer: Self-pay | Admitting: Nurse Practitioner

## 2022-12-29 VITALS — BP 124/78 | HR 81 | Temp 98.4°F | Resp 16 | Ht 63.5 in | Wt 225.2 lb

## 2022-12-29 DIAGNOSIS — U071 COVID-19: Secondary | ICD-10-CM

## 2022-12-29 DIAGNOSIS — G4483 Primary cough headache: Secondary | ICD-10-CM | POA: Diagnosis not present

## 2022-12-29 LAB — POC COVID19 BINAXNOW: SARS Coronavirus 2 Ag: POSITIVE — AB

## 2022-12-29 MED ORDER — NIRMATRELVIR/RITONAVIR (PAXLOVID)TABLET: 3.0000 | ORAL_TABLET | Freq: Two times a day (BID) | ORAL | 0 refills | Status: AC

## 2022-12-29 NOTE — Assessment & Plan Note (Signed)
COVID test positive off.  Given patient's comorbidities would like to treat with Paxlovid.  She will do rosuvastatin for 2 weeks.  Did review CDC guidelines/recommendations in regards to self-isolation/quarantine.  Did review signs and symptoms when to seek urgent emergent healthcare.  Patient was given a work note today

## 2022-12-29 NOTE — Patient Instructions (Signed)
Nice to see you today You need to quarantine through Sunday 12/31/2022. If fever free for 24 hours without the use of fever reducing medicines you can go to work on Monday 01/01/2023, but have to wear a mask for 5 days  Hold the Crestor (rosuvastatin) for 2 weeks

## 2022-12-29 NOTE — Progress Notes (Signed)
Acute Office Visit  Subjective:     Patient ID: April Cooper, female    DOB: 1966/01/28, 57 y.o.   MRN: [id number]  Chief Complaint  Patient presents with   Sinus Problem    Covid neg yesterday started 3 days ago    HPI Patient is in today for sinus issues with a history of HTN, DM2, HLD,  Symptoms started 3 days ago  Covid vaccine: moderna x2 and booster Flu vaccine: UTD Covid test yesterday that was negative Sick contacts: states that her boss has been sick but has not been in the office  Tylenol over the counter that has helped   Review of Systems  Constitutional:  Positive for chills. Negative for fever and malaise/fatigue.       Appetite has not changed per patient  Fluid intake is good   HENT:  Positive for congestion, ear pain and sinus pain. Negative for sore throat.   Respiratory:  Positive for cough and sputum production. Negative for shortness of breath.   Musculoskeletal:  Negative for joint pain and myalgias.  Neurological:  Positive for headaches.        Objective:    BP 124/78   Pulse 81   Temp 98.4 F (36.9 C)   Resp 16   Ht 5' 3.5" (1.613 m)   Wt 225 lb 4 oz (102.2 kg)   SpO2 99%   BMI 39.28 kg/m    Physical Exam Vitals and nursing note reviewed.  Constitutional:      Appearance: Normal appearance.  HENT:     Right Ear: Tympanic membrane, ear canal and external ear normal.     Left Ear: Tympanic membrane, ear canal and external ear normal.     Nose:     Right Sinus: Maxillary sinus tenderness and frontal sinus tenderness present.     Left Sinus: Maxillary sinus tenderness and frontal sinus tenderness present.     Mouth/Throat:     Mouth: Mucous membranes are moist.     Pharynx: Oropharynx is clear.  Cardiovascular:     Rate and Rhythm: Normal rate and regular rhythm.     Heart sounds: Normal heart sounds.  Pulmonary:     Effort: Pulmonary effort is normal.     Breath sounds: Normal breath sounds.  Lymphadenopathy:      Cervical: No cervical adenopathy.  Neurological:     Mental Status: She is alert.     No results found for any visits on 12/29/22.      Assessment & Plan:   Problem List Items Addressed This Visit       Other   Headache after cough - Primary    COVID test in office.  Patient continue using over-the-counter Tylenol as needed for headache.      Relevant Orders   POC COVID-19 BinaxNow   COVID-19    COVID test positive off.  Given patient's comorbidities would like to treat with Paxlovid.  She will do rosuvastatin for 2 weeks.  Did review CDC guidelines/recommendations in regards to self-isolation/quarantine.  Did review signs and symptoms when to seek urgent emergent healthcare.  Patient was given a work note today      Relevant Medications   nirmatrelvir/ritonavir (PAXLOVID) 20 x 150 MG & 10 x 100MG  TABS    Meds ordered this encounter  Medications   nirmatrelvir/ritonavir (PAXLOVID) 20 x 150 MG & 10 x 100MG  TABS    Sig: Take 3 tablets by mouth 2 (two) times daily for 5  days. (Take nirmatrelvir 150 mg two tablets twice daily for 5 days and ritonavir 100 mg one tablet twice daily for 5 days) Patient GFR is 73    Dispense:  30 tablet    Refill:  0    Order Specific Question:   Supervising Provider    Answer:   Loura Pardon A [[id number]]    Return if symptoms worsen or fail to improve, for As scheduled on 01/08/2023.  Romilda Garret, NP

## 2022-12-29 NOTE — Assessment & Plan Note (Signed)
COVID test in office.  Patient continue using over-the-counter Tylenol as needed for headache.

## 2023-01-05 ENCOUNTER — Ambulatory Visit: Payer: 59 | Admitting: Nurse Practitioner

## 2023-01-06 ENCOUNTER — Other Ambulatory Visit: Payer: Self-pay | Admitting: Family

## 2023-01-06 ENCOUNTER — Other Ambulatory Visit: Payer: Self-pay | Admitting: Nurse Practitioner

## 2023-01-06 DIAGNOSIS — E119 Type 2 diabetes mellitus without complications: Secondary | ICD-10-CM

## 2023-01-07 ENCOUNTER — Other Ambulatory Visit: Payer: Self-pay | Admitting: Family

## 2023-01-07 DIAGNOSIS — E119 Type 2 diabetes mellitus without complications: Secondary | ICD-10-CM

## 2023-01-08 ENCOUNTER — Ambulatory Visit: Payer: 59 | Admitting: Nurse Practitioner

## 2023-01-08 VITALS — BP 134/74 | HR 65 | Temp 98.0°F | Resp 16 | Ht 63.5 in | Wt 226.5 lb

## 2023-01-08 DIAGNOSIS — E1165 Type 2 diabetes mellitus with hyperglycemia: Secondary | ICD-10-CM

## 2023-01-08 DIAGNOSIS — I10 Essential (primary) hypertension: Secondary | ICD-10-CM

## 2023-01-08 LAB — POCT GLYCOSYLATED HEMOGLOBIN (HGB A1C): Hemoglobin A1C: 7.3 % — AB (ref 4.0–5.6)

## 2023-01-08 NOTE — Assessment & Plan Note (Signed)
Patient currently losartan 80 mg daily.  Continue medication as prescribed.  Blood pressure within normal limits today.

## 2023-01-08 NOTE — Progress Notes (Signed)
Established Patient Office Visit  Subjective   Patient ID: April Cooper, female    DOB: 1966-01-24  Age: 57 y.o. MRN: [id number]  Chief Complaint  Patient presents with   Diabetes       DM2: currently on jardiance, metformin, and ozempic. She has a CGM. Last office visit we did increase metformin from 1000mg  to 1500mg  daily. She does not tolerate higher doss of ozempic. States that she is taking her jardiance first thing in the am and states that she will take the metformin with a meal.  Patient states she had 2 weeks without Ozempic due to a stock issue.  States that she will check her glucose every morning before breakfast, lunch and dinner.  States that she does have a continuous glucose monitor that she will use at times  States that she is walking a couple times a week and does approx 20 mins. She works in the   HTN: states that her blood pressure hads been doing well.      Review of Systems  Constitutional:  Negative for chills and fever.  Respiratory:  Negative for shortness of breath.   Cardiovascular:  Negative for chest pain.  Gastrointestinal:  Negative for abdominal pain, constipation, diarrhea, nausea and vomiting.  Neurological:  Negative for headaches.      Objective:     BP 134/74   Pulse 65   Temp 98 F (36.7 C)   Resp 16   Ht 5' 3.5" (1.613 m)   Wt 226 lb 8 oz (102.7 kg)   SpO2 98%   BMI 39.49 kg/m  BP Readings from Last 3 Encounters:  01/08/23 134/74  12/29/22 124/78  10/06/22 122/64   Wt Readings from Last 3 Encounters:  01/08/23 226 lb 8 oz (102.7 kg)  12/29/22 225 lb 4 oz (102.2 kg)  10/23/22 228 lb (103.4 kg)      Physical Exam Vitals and nursing note reviewed.  Constitutional:      Appearance: Normal appearance.  Cardiovascular:     Rate and Rhythm: Normal rate and regular rhythm.     Pulses:          Dorsalis pedis pulses are 2+ on the right side and 2+ on the left side.       Posterior tibial pulses are 2+ on the  right side and 2+ on the left side.     Heart sounds: Normal heart sounds.  Pulmonary:     Effort: Pulmonary effort is normal.     Breath sounds: Normal breath sounds.  Musculoskeletal:     Right lower leg: No edema.     Left lower leg: No edema.       Feet:  Feet:     Right foot:     Skin integrity: Skin integrity normal.     Left foot:     Skin integrity: Skin integrity normal.  Neurological:     Mental Status: She is alert.      Results for orders placed or performed in visit on 01/08/23  POCT glycosylated hemoglobin (Hb A1C)  Result Value Ref Range   Hemoglobin A1C 7.3 (A) 4.0 - 5.6 %   HbA1c POC (<> result, manual entry)     HbA1c, POC (prediabetic range)     HbA1c, POC (controlled diabetic range)        The 10-year ASCVD risk score (Arnett DK, et al., 2019) is: 12.4%    Assessment & Plan:   Problem List Items Addressed This  Visit       Cardiovascular and Mediastinum   Essential hypertension - Primary    Patient currently losartan 80 mg daily.  Continue medication as prescribed.  Blood pressure within normal limits today.        Endocrine   Uncontrolled type 2 diabetes mellitus with hyperglycemia    Patient currently maintained on 10 mg of Jardiance, metformin 1500 mg daily, Ozempic 0.5 mg once weekly.  Patient's A1c has trended down from 7.5 to 7.3%.  Patient's working lifestyle modifications and states that she is lost a lot of weight and closer fitting different continue working on lifestyle modifications if patient's A1c is not trending down or going up we can increase either the metformin to max dose or increase her Jardiance.  Plan on checking labs at next office visit      Relevant Orders   POCT glycosylated hemoglobin (Hb A1C) (Completed)    Return in about 4 months (around 05/10/2023) for DM recheck, with lab work .    Romilda Garret, NP

## 2023-01-08 NOTE — Assessment & Plan Note (Signed)
Patient currently maintained on 10 mg of Jardiance, metformin 1500 mg daily, Ozempic 0.5 mg once weekly.  Patient's A1c has trended down from 7.5 to 7.3%.  Patient's working lifestyle modifications and states that she is lost a lot of weight and closer fitting different continue working on lifestyle modifications if patient's A1c is not trending down or going up we can increase either the metformin to max dose or increase her Jardiance.  Plan on checking labs at next office visit

## 2023-01-08 NOTE — Patient Instructions (Signed)
Nice to see you today I want to see you in 4 months for a recheck Continue working on your lifestyle modifications.  We will check labs at next office visit.

## 2023-01-09 ENCOUNTER — Other Ambulatory Visit: Payer: Self-pay

## 2023-01-09 DIAGNOSIS — I10 Essential (primary) hypertension: Secondary | ICD-10-CM

## 2023-01-09 NOTE — Telephone Encounter (Signed)
losartan (COZAAR) 50 MG tablet Last visit 01/08/2023 Next visit 05/10/2023

## 2023-01-11 MED ORDER — LOSARTAN POTASSIUM 50 MG PO TABS: 50.0000 mg | ORAL_TABLET | Freq: Every day | ORAL | 3 refills | Status: DC

## 2023-02-06 ENCOUNTER — Encounter: Payer: Self-pay | Admitting: Nurse Practitioner

## 2023-02-06 DIAGNOSIS — E119 Type 2 diabetes mellitus without complications: Secondary | ICD-10-CM

## 2023-02-07 MED ORDER — SEMAGLUTIDE(0.25 OR 0.5MG/DOS) 2 MG/3ML ~~LOC~~ SOPN: 0.5000 mg | PEN_INJECTOR | SUBCUTANEOUS | 0 refills | Status: DC

## 2023-02-07 MED ORDER — ROSUVASTATIN CALCIUM 5 MG PO TABS: 5.0000 mg | ORAL_TABLET | Freq: Every day | ORAL | 1 refills | Status: DC

## 2023-03-16 ENCOUNTER — Encounter: Payer: Self-pay | Admitting: Internal Medicine

## 2023-04-09 ENCOUNTER — Other Ambulatory Visit: Payer: Self-pay | Admitting: Nurse Practitioner

## 2023-04-09 DIAGNOSIS — E119 Type 2 diabetes mellitus without complications: Secondary | ICD-10-CM

## 2023-04-30 ENCOUNTER — Other Ambulatory Visit: Payer: Self-pay | Admitting: Nurse Practitioner

## 2023-04-30 DIAGNOSIS — E119 Type 2 diabetes mellitus without complications: Secondary | ICD-10-CM

## 2023-04-30 NOTE — Telephone Encounter (Signed)
Per chart review patient should have supply to last until office visit on 8/1.

## 2023-05-10 ENCOUNTER — Encounter: Payer: Self-pay | Admitting: Nurse Practitioner

## 2023-05-10 ENCOUNTER — Ambulatory Visit: Payer: 59 | Admitting: Nurse Practitioner

## 2023-05-10 VITALS — BP 132/74 | HR 58 | Temp 97.8°F | Ht 63.5 in | Wt 220.0 lb

## 2023-05-10 DIAGNOSIS — E785 Hyperlipidemia, unspecified: Secondary | ICD-10-CM

## 2023-05-10 DIAGNOSIS — E119 Type 2 diabetes mellitus without complications: Secondary | ICD-10-CM | POA: Diagnosis not present

## 2023-05-10 DIAGNOSIS — Z7984 Long term (current) use of oral hypoglycemic drugs: Secondary | ICD-10-CM

## 2023-05-10 DIAGNOSIS — I10 Essential (primary) hypertension: Secondary | ICD-10-CM | POA: Diagnosis not present

## 2023-05-10 DIAGNOSIS — E559 Vitamin D deficiency, unspecified: Secondary | ICD-10-CM | POA: Diagnosis not present

## 2023-05-10 DIAGNOSIS — E1169 Type 2 diabetes mellitus with other specified complication: Secondary | ICD-10-CM

## 2023-05-10 LAB — COMPREHENSIVE METABOLIC PANEL
ALT: 19 U/L (ref 0–35)
AST: 17 U/L (ref 0–37)
Albumin: 4.1 g/dL (ref 3.5–5.2)
Alkaline Phosphatase: 62 U/L (ref 39–117)
BUN: 11 mg/dL (ref 6–23)
CO2: 28 mEq/L (ref 19–32)
Calcium: 9.8 mg/dL (ref 8.4–10.5)
Chloride: 104 mEq/L (ref 96–112)
Creatinine, Ser: 0.89 mg/dL (ref 0.40–1.20)
GFR: 71.28 mL/min (ref >60.00)
Glucose, Bld: 123 mg/dL — ABNORMAL HIGH (ref 70–99)
Potassium: 4.4 mEq/L (ref 3.5–5.1)
Sodium: 139 mEq/L (ref 135–145)
Total Bilirubin: 0.4 mg/dL (ref 0.2–1.2)
Total Protein: 7.3 g/dL (ref 6.0–8.3)

## 2023-05-10 LAB — MICROALBUMIN / CREATININE URINE RATIO
Creatinine,U: 137.1 mg/dL
Microalb Creat Ratio: 0.5 mg/g (ref 0.0–30.0)
Microalb, Ur: <0.7 mg/dL (ref 0.0–1.9)

## 2023-05-10 LAB — LIPID PANEL
Cholesterol: 147 mg/dL (ref 0–200)
HDL: 57.2 mg/dL (ref >39.00)
LDL Cholesterol: 77 mg/dL (ref 0–99)
NonHDL: 89.97
Total CHOL/HDL Ratio: 3
Triglycerides: 65 mg/dL (ref 0.0–149.0)
VLDL: 13 mg/dL (ref 0.0–40.0)

## 2023-05-10 LAB — CBC
HCT: 43.7 % (ref 36.0–46.0)
Hemoglobin: 14.2 g/dL (ref 12.0–15.0)
MCHC: 32.4 g/dL (ref 30.0–36.0)
MCV: 86.2 fl (ref 78.0–100.0)
Platelets: 325 10*3/uL (ref 150.0–400.0)
RBC: 5.07 Mil/uL (ref 3.87–5.11)
RDW: 14.5 % (ref 11.5–15.5)
WBC: 5.8 10*3/uL (ref 4.0–10.5)

## 2023-05-10 LAB — VITAMIN D 25 HYDROXY (VIT D DEFICIENCY, FRACTURES): VITD: 31.74 ng/mL (ref 30.00–100.00)

## 2023-05-10 LAB — POCT GLYCOSYLATED HEMOGLOBIN (HGB A1C): Hemoglobin A1C: 6.8 % — AB (ref 4.0–5.6)

## 2023-05-10 MED ORDER — FREESTYLE LIBRE 3 SENSOR MISC: 3 refills | Status: DC

## 2023-05-10 NOTE — Patient Instructions (Signed)
Nice to see you today Your A1C was 6.8% and you have continued to lose weight. Keep up the great work I want to see you in 4 months for a recheck, sooner if you need me I will be in touch with the labs once I have reviewed them

## 2023-05-10 NOTE — Progress Notes (Signed)
Established Patient Office Visit  Subjective   Patient ID: April Cooper, female    DOB: Jan 22, 1966  Age: 57 y.o. MRN: [id number]  Chief Complaint  Patient presents with   Diabetes   Hypertension    Diabetes Pertinent negatives for hypoglycemia include no dizziness or headaches. Pertinent negatives for diabetes include no chest pain.  Hypertension Pertinent negatives include no chest pain, headaches or shortness of breath.   6.8 DM2: patient currently maintained on Jardiance, metformin, Ozempic.  She does have a continuous glucose monitor.  Patient does not tolerate higher doses of Ozempic.  A1c did go from 7.5-7.3 last time. States that she has started a new position and is moving more and some more stress States that she does have a CGM but ran out of refills   HTN: Patient currently maintained on losartan 50 mg daily.   Review of Systems  Constitutional:  Negative for chills and fever.  Respiratory:  Negative for shortness of breath.   Cardiovascular:  Negative for chest pain.  Gastrointestinal:  Negative for abdominal pain, constipation, diarrhea, nausea and vomiting.       BM daily   Neurological:  Negative for dizziness and headaches.      Objective:     BP 132/74   Pulse (!) 58   Temp 97.8 F (36.6 C) (Temporal)   Ht 5' 3.5" (1.613 m)   Wt 220 lb (99.8 kg)   SpO2 98%   BMI 38.36 kg/m  BP Readings from Last 3 Encounters:  05/10/23 132/74  01/08/23 134/74  12/29/22 124/78   Wt Readings from Last 3 Encounters:  05/10/23 220 lb (99.8 kg)  01/08/23 226 lb 8 oz (102.7 kg)  12/29/22 225 lb 4 oz (102.2 kg)      Physical Exam Vitals and nursing note reviewed.  Constitutional:      Appearance: Normal appearance.  Cardiovascular:     Rate and Rhythm: Normal rate and regular rhythm.     Pulses:          Dorsalis pedis pulses are 2+ on the right side and 2+ on the left side.       Posterior tibial pulses are 2+ on the right side and 2+ on the left  side.     Heart sounds: Normal heart sounds.  Pulmonary:     Effort: Pulmonary effort is normal.     Breath sounds: Normal breath sounds.  Musculoskeletal:     Right lower leg: No edema.     Left lower leg: No edema.  Feet:     Right foot:     Skin integrity: Skin integrity normal.     Left foot:     Skin integrity: Skin integrity normal.  Neurological:     Mental Status: She is alert.      Results for orders placed or performed in visit on 05/10/23  POCT glycosylated hemoglobin (Hb A1C)  Result Value Ref Range   Hemoglobin A1C 6.8 (A) 4.0 - 5.6 %   HbA1c POC (<> result, manual entry)     HbA1c, POC (prediabetic range)     HbA1c, POC (controlled diabetic range)        The 10-year ASCVD risk score (Arnett DK, et al., 2019) is: 11.8%    Assessment & Plan:   Problem List Items Addressed This Visit       Cardiovascular and Mediastinum   Essential hypertension    Patient currently maintained on losartan 50 mg daily.  Blood pressure  under great control.  Patient tolerating medication well continue medication as prescribed      Relevant Orders   CBC   Comprehensive metabolic panel     Endocrine   Hyperlipidemia associated with type 2 diabetes mellitus (HCC)    Patient currently rosuvastatin 5 mg daily.  Spent an extended period of time since lipid panel checked patient is fasting today pending lipid panel today continue rosuvastatin 5 mg at this juncture.      Relevant Orders   Lipid panel   Controlled type 2 diabetes mellitus without complication, without long-term current use of insulin (HCC) - Primary    Patient's A1c went from 7.3% to 6.8%.  Currently maintained on metformin 1500 mg, Jardiance 10 mg, Ozempic 0.5 mg.  Patient is continue working lifestyle modification has lost an additional 6 pounds.  No medication changes today continue working lifestyle modifications.  If A1c is the same or lower next time will treat patient for 71-month follow-up.  Consider  backing off of metformin first if A1c continues to trend down      Relevant Medications   Continuous Glucose Sensor (FREESTYLE LIBRE 3 SENSOR) MISC   Other Relevant Orders   CBC   Comprehensive metabolic panel   Lipid panel   Microalbumin / creatinine urine ratio   POCT glycosylated hemoglobin (Hb A1C) (Completed)     Other   Vitamin D deficiency    History of same pending vitamin D level today      Relevant Orders   VITAMIN D 25 Hydroxy (Vit-D Deficiency, Fractures)    Return in about 4 months (around 09/09/2023) for DM recheck.    Audria Nine, NP

## 2023-05-10 NOTE — Assessment & Plan Note (Signed)
Patient currently rosuvastatin 5 mg daily.  Spent an extended period of time since lipid panel checked patient is fasting today pending lipid panel today continue rosuvastatin 5 mg at this juncture.

## 2023-05-10 NOTE — Assessment & Plan Note (Signed)
Patient currently maintained on losartan 50 mg daily.  Blood pressure under great control.  Patient tolerating medication well continue medication as prescribed

## 2023-05-10 NOTE — Assessment & Plan Note (Signed)
History of same pending vitamin D level today

## 2023-05-10 NOTE — Assessment & Plan Note (Signed)
Patient's A1c went from 7.3% to 6.8%.  Currently maintained on metformin 1500 mg, Jardiance 10 mg, Ozempic 0.5 mg.  Patient is continue working lifestyle modification has lost an additional 6 pounds.  No medication changes today continue working lifestyle modifications.  If A1c is the same or lower next time will treat patient for 3-month follow-up.  Consider backing off of metformin first if A1c continues to trend down

## 2023-05-14 LAB — HM DIABETES EYE EXAM

## 2023-05-17 ENCOUNTER — Encounter: Payer: Self-pay | Admitting: Internal Medicine

## 2023-05-17 ENCOUNTER — Ambulatory Visit (AMBULATORY_SURGERY_CENTER): Payer: 59

## 2023-05-17 VITALS — Ht 64.0 in | Wt 222.0 lb

## 2023-05-17 DIAGNOSIS — Z86010 Personal history of colonic polyps: Secondary | ICD-10-CM

## 2023-05-17 MED ORDER — NA SULFATE-K SULFATE-MG SULF 17.5-3.13-1.6 GM/177ML PO SOLN
1.0000 | Freq: Once | ORAL | 0 refills | Status: AC
Start: 2023-05-17 — End: 2023-05-17

## 2023-05-17 NOTE — Progress Notes (Signed)

## 2023-05-31 ENCOUNTER — Encounter: Payer: Self-pay | Admitting: Internal Medicine

## 2023-05-31 ENCOUNTER — Ambulatory Visit (AMBULATORY_SURGERY_CENTER): Payer: 59 | Admitting: Internal Medicine

## 2023-05-31 VITALS — BP 130/68 | HR 53 | Temp 97.3°F | Resp 12 | Ht 63.5 in | Wt 222.0 lb

## 2023-05-31 DIAGNOSIS — Z09 Encounter for follow-up examination after completed treatment for conditions other than malignant neoplasm: Secondary | ICD-10-CM | POA: Diagnosis not present

## 2023-05-31 DIAGNOSIS — Z86010 Personal history of colonic polyps: Secondary | ICD-10-CM

## 2023-05-31 MED ORDER — SODIUM CHLORIDE 0.9 % IV SOLN: 500.0000 mL | Freq: Once | INTRAVENOUS | Status: DC

## 2023-05-31 NOTE — Op Note (Signed)
[facility] Patient Name: April Cooper Procedure Date: 05/31/2023 8:18 AM MRN: [id number] Endoscopist: Beverley Fiedler , MD, [id number] Age: 57 Referring MD:  Date of Birth: 1966-06-22 Gender: Female Account #: [id number] Procedure:                Colonoscopy Indications:              High risk colon cancer surveillance: Personal                            history of non-advanced adenoma, Last colonoscopy:                            June 2018 (TA x 1) Medicines:                Monitored Anesthesia Care Procedure:                Pre-Anesthesia Assessment:                           - Prior to the procedure, a History and Physical                            was performed, and patient medications and                            allergies were reviewed. The patient's tolerance of                            previous anesthesia was also reviewed. The risks                            and benefits of the procedure and the sedation                            options and risks were discussed with the patient.                            All questions were answered, and informed consent                            was obtained. Prior Anticoagulants: The patient has                            taken no anticoagulant or antiplatelet agents. ASA                            Grade Assessment: II - A patient with mild systemic                            disease. After reviewing the risks and benefits,                            the patient was deemed in satisfactory condition to  undergo the procedure.                           After obtaining informed consent, the colonoscope                            was passed under direct vision. Throughout the                            procedure, the patient's blood pressure, pulse, and                            oxygen saturations were monitored continuously. The                            CF [id number] #[id number] was introduced  through the anus                            and advanced to the cecum, identified by                            appendiceal orifice and ileocecal valve. The                            colonoscopy was performed without difficulty. The                            patient tolerated the procedure well. The quality                            of the bowel preparation was good. The ileocecal                            valve, appendiceal orifice, and rectum were                            photographed. Scope In: 8:27:55 AM Scope Out: 8:38:36 AM Scope Withdrawal Time: 0 hours 8 minutes 13 seconds  Total Procedure Duration: 0 hours 10 minutes 41 seconds  Findings:                 Skin tags were found on perianal exam.                           Multiple medium-mouthed and small-mouthed                            diverticula were found in the sigmoid colon,                            descending colon, hepatic flexure and ascending                            colon.  The exam was otherwise without abnormality on                            direct and retroflexion views. Complications:            No immediate complications. Estimated Blood Loss:     Estimated blood loss: none. Impression:               - Perianal skin tags found on perianal exam.                           - Moderate diverticulosis in the sigmoid colon, in                            the descending colon, at the hepatic flexure and in                            the ascending colon.                           - The examination was otherwise normal on direct                            and retroflexion views.                           - No specimens collected. Recommendation:           - Patient has a contact number available for                            emergencies. The signs and symptoms of potential                            delayed complications were discussed with the                            patient.  Return to normal activities tomorrow.                            Written discharge instructions were provided to the                            patient.                           - Resume previous diet.                           - Continue present medications.                           - Repeat colonoscopy in 10 years for surveillance. Beverley Fiedler, MD 05/31/2023 8:40:28 AM This report has been signed electronically.

## 2023-05-31 NOTE — Progress Notes (Signed)
Report to PACU, RN, vss, BBS= Clear.  

## 2023-05-31 NOTE — Patient Instructions (Signed)
Resume previous diet Continue present medications There were no colon polyps seen today!  You will need another screening colonoscopy in 10 years, you will receive a letter at that time when you are due for the procedure.   Please call us at [phone] if you have a change in bowel habits, change in family history of colo-rectal cancer, rectal bleeding or other GI concern before that time.  Handouts/information given for diverticulosis   YOU HAD AN ENDOSCOPIC PROCEDURE TODAY AT THE [facility]:   Refer to the procedure report that was given to you for any specific questions about what was found during the examination.  If the procedure report does not answer your questions, please call your gastroenterologist to clarify.  If you requested that your care partner not be given the details of your procedure findings, then the procedure report has been included in a sealed envelope for you to review at your convenience later.  YOU SHOULD EXPECT: Some feelings of bloating in the abdomen. Passage of more gas than usual.  Walking can help get rid of the air that was put into your GI tract during the procedure and reduce the bloating. If you had a lower endoscopy (such as a colonoscopy or flexible sigmoidoscopy) you may notice spotting of blood in your stool or on the toilet paper. If you underwent a bowel prep for your procedure, you may not have a normal bowel movement for a few days.  Please Note:  You might notice some irritation and congestion in your nose or some drainage.  This is from the oxygen used during your procedure.  There is no need for concern and it should clear up in a day or so.  SYMPTOMS TO REPORT IMMEDIATELY:  Following lower endoscopy (colonoscopy):  Excessive amounts of blood in the stool  Significant tenderness or worsening of abdominal pains  Swelling of the abdomen that is new, acute  Fever of 100F or higher  For urgent or emergent issues, a gastroenterologist  can be reached at any hour by calling [phone]. Do not use MyChart messaging for urgent concerns.   DIET:  We do recommend a small meal at first, but then you may proceed to your regular diet.  Drink plenty of fluids but you should avoid alcoholic beverages for 24 hours.  ACTIVITY:  You should plan to take it easy for the rest of today and you should NOT DRIVE or use heavy machinery until tomorrow (because of the sedation medicines used during the test).    FOLLOW UP: Our staff will call the number listed on your records the next business day following your procedure.  We will call around 7:15- 8:00 am to check on you and address any questions or concerns that you may have regarding the information given to you following your procedure. If we do not reach you, we will leave a message.     SIGNATURES/CONFIDENTIALITY: You and/or your care partner have signed paperwork which will be entered into your electronic medical record.  These signatures attest to the fact that that the information above on your After Visit Summary has been reviewed and is understood.  Full responsibility of the confidentiality of this discharge information lies with you and/or your care-partner.

## 2023-05-31 NOTE — Progress Notes (Signed)
Pt's states no medical or surgical changes since previsit or office visit. VS assessed by C.W 

## 2023-05-31 NOTE — Progress Notes (Signed)
GASTROENTEROLOGY PROCEDURE H&P NOTE   Primary Care Physician: Eden Emms, NP    Reason for Procedure:  History of adenomatous colon polyp  Plan:    Colonoscopy  Patient is appropriate for endoscopic procedure(s) in the ambulatory ([facility]) setting.  The nature of the procedure, as well as the risks, benefits, and alternatives were carefully and thoroughly reviewed with the patient. Ample time for discussion and questions allowed. The patient understood, was satisfied, and agreed to proceed.     HPI: April Cooper is a 57 y.o. female who presents for surveillance colonoscopy.  Medical history as below.  Tolerated the prep.  No recent chest pain or shortness of breath.  No abdominal pain today.  Past Medical History:  Diagnosis Date   AMA (advanced maternal age) multigravida 35+    Diabetes mellitus without complication (HCC)    Ectopic pregnancy    Hyperlipidemia    Hyperlipidemia    Hyperlipidemia    Hypertension    Menometrorrhagia 01/2004, 12/2011   OBESITY    Penicillin allergy 11/04/2002   Vitamin D deficiency    Vulvovaginal candidiasis 07/2006    Past Surgical History:  Procedure Laterality Date   ABLATION  2013   CYSTOSCOPY WITH RETROGRADE PYELOGRAM, URETEROSCOPY AND STENT PLACEMENT Right 01/16/2013   Procedure: CYSTOSCOPY WITH RETROGRADE PYELOGRAM, URETEROSCOPY AND STENT PLACEMENT;  Surgeon: Crecencio Mc, MD;  Location: [facility];  Service: Urology;  Laterality: Right;   HOLMIUM LASER APPLICATION Right 01/16/2013   Procedure: HOLMIUM LASER APPLICATION;  Surgeon: Crecencio Mc, MD;  Location: [facility];  Service: Urology;  Laterality: Right;   I & D EXTREMITY Left 03/10/2022   Procedure: LEFT FOOT DEBRIDEMENT;  Surgeon: Nadara Mustard, MD;  Location: [facility];  Service: Orthopedics;  Laterality: Left;   TUBAL LIGATION  2004   TUBAL LIGATION  2004    Prior to Admission medications   Medication Sig Start Date End Date Taking? Authorizing Provider  Accu-Chek FastClix  Lancets MISC 1 each by Does not apply route in the morning, at noon, and at bedtime. 01/02/22  Yes Gweneth Dimitri, MD  ACCU-CHEK GUIDE test strip USE UP TO 4 TIMES A DAY AS DIRECTED 04/27/22  Yes Gweneth Dimitri, MD  blood glucose meter kit and supplies Dispense based on patient and insurance preference. Use up to four times daily as directed. (FOR ICD-10 E10.9, E11.9). 09/11/19  Yes Cirigliano, Mary K, DO  cholecalciferol (VITAMIN D3) 25 MCG (1000 UNIT) tablet Take 2,000 Units by mouth daily.   Yes [provider]  empagliflozin (JARDIANCE) 10 MG TABS tablet TAKE 1 TABLET BY MOUTH DAILY BEFORE BREAKFAST. 01/08/23  Yes Eden Emms, NP  latanoprost (XALATAN) 0.005 % ophthalmic solution Place 1 drop into both eyes at bedtime. 02/21/22  Yes [provider]  losartan (COZAAR) 50 MG tablet Take 1 tablet (50 mg total) by mouth daily. 01/11/23  Yes Eden Emms, NP  metFORMIN (GLUCOPHAGE-XR) 500 MG 24 hr tablet TAKE 2 TABLETS (1,000MG ) IN THE MORNING AND 1 TABLET (500MG ) IN THE EVENING 04/09/23  Yes Eden Emms, NP  rosuvastatin (CRESTOR) 5 MG tablet Take 1 tablet (5 mg total) by mouth daily. Will need to be seen in office for more refills. 02/07/23  Yes Eden Emms, NP  clotrimazole-betamethasone (LOTRISONE) cream Apply 1 application  topically daily. Patient not taking: Reported on 05/17/2023 03/17/22   Gweneth Dimitri, MD  Continuous Glucose Sensor (FREESTYLE LIBRE 3 SENSOR) MISC Apply sensor every 14 days. 05/10/23  Eden Emms, NP  ibuprofen (ADVIL) 200 MG tablet Take 600 mg by mouth every 6 (six) hours as needed for moderate pain. Patient not taking: Reported on 05/31/2023    [provider]  Semaglutide,0.25 or 0.5MG /DOS, 2 MG/3ML SOPN Inject 0.5 mg into the skin once a week. 02/07/23   Eden Emms, NP    Current Outpatient Medications  Medication Sig Dispense Refill   Accu-Chek FastClix Lancets MISC 1 each by Does not apply route in the morning, at noon, and at bedtime. 102  each 3   ACCU-CHEK GUIDE test strip USE UP TO 4 TIMES A DAY AS DIRECTED 100 strip 3   blood glucose meter kit and supplies Dispense based on patient and insurance preference. Use up to four times daily as directed. (FOR ICD-10 E10.9, E11.9). 1 each 0   cholecalciferol (VITAMIN D3) 25 MCG (1000 UNIT) tablet Take 2,000 Units by mouth daily.     empagliflozin (JARDIANCE) 10 MG TABS tablet TAKE 1 TABLET BY MOUTH DAILY BEFORE BREAKFAST. 90 tablet 1   latanoprost (XALATAN) 0.005 % ophthalmic solution Place 1 drop into both eyes at bedtime.     losartan (COZAAR) 50 MG tablet Take 1 tablet (50 mg total) by mouth daily. 90 tablet 3   metFORMIN (GLUCOPHAGE-XR) 500 MG 24 hr tablet TAKE 2 TABLETS (1,000MG ) IN THE MORNING AND 1 TABLET (500MG ) IN THE EVENING 90 tablet 5   rosuvastatin (CRESTOR) 5 MG tablet Take 1 tablet (5 mg total) by mouth daily. Will need to be seen in office for more refills. 90 tablet 1   clotrimazole-betamethasone (LOTRISONE) cream Apply 1 application  topically daily. (Patient not taking: Reported on 05/17/2023) 30 g 0   Continuous Glucose Sensor (FREESTYLE LIBRE 3 SENSOR) MISC Apply sensor every 14 days. 2 each 3   ibuprofen (ADVIL) 200 MG tablet Take 600 mg by mouth every 6 (six) hours as needed for moderate pain. (Patient not taking: Reported on 05/31/2023)     Semaglutide,0.25 or 0.5MG /DOS, 2 MG/3ML SOPN Inject 0.5 mg into the skin once a week. 9 mL 0   Current Facility-Administered Medications  Medication Dose Route Frequency Provider Last Rate Last Admin   0.9 %  sodium chloride infusion  500 mL Intravenous Once Kaysha Parsell, Carie Caddy, MD        Allergies as of 05/31/2023 - Review Complete 05/31/2023  Allergen Reaction Noted   Penicillins Hives and Rash 11/04/2002    Family History  Problem Relation Age of Onset   Hypertension Mother    Hypertension Father    Diabetes Sister    Diabetes Maternal Aunt    Breast cancer Paternal Aunt 55   Hypertension Maternal Grandmother    Breast  cancer Paternal Grandmother    Colon cancer Neg Hx    Colon polyps Neg Hx    Esophageal cancer Neg Hx    Rectal cancer Neg Hx    Stomach cancer Neg Hx     Social History   Socioeconomic History   Marital status: Married    Spouse name: Homero Fellers   Number of children: 3   Years of education: College   Highest education level: Bachelor's degree (e.g., BA, AB, BS)  Occupational History   Not on file  Tobacco Use   Smoking status: Never   Smokeless tobacco: Never  Vaping Use   Vaping status: Never Used  Substance and Sexual Activity   Alcohol use: Yes    Comment: twice monthly   Drug use: No  Sexual activity: Not Currently    Birth control/protection: Surgical  Other Topics Concern   Not on file  Social History Narrative   08/03/20   From: the area   Living: with husband, Homero Fellers (2012) and 3 kids at home   Work: [employer]       Family: Pennie Rushing (2009), [place], [place] and then step son Loraine Leriche      Enjoys: family time      Exercise: walking on the treadmill (twice a week) and stairs at work   Diet: diabetic diet      Safety   Seat belts: Yes    Guns: Yes  and secure   Safe in relationships: Yes    Social Determinants of Health   Financial Resource Strain: Low Risk  (01/08/2023)   Overall Financial Resource Strain (CARDIA)    Difficulty of Paying Living Expenses: Not very hard  Food Insecurity: No Food Insecurity (01/08/2023)   Hunger Vital Sign    Worried About Running Out of Food in the Last Year: Never true    Ran Out of Food in the Last Year: Never true  Transportation Needs: No Transportation Needs (01/08/2023)   PRAPARE - Administrator, Civil Service (Medical): No    Lack of Transportation (Non-Medical): No  Physical Activity: Insufficiently Active (01/08/2023)   Exercise Vital Sign    Days of Exercise per Week: 3 days    Minutes of Exercise per Session: 30 min  Stress: No Stress Concern Present (01/08/2023)   [employer] of  Occupational Health - Occupational Stress Questionnaire    Feeling of Stress : Not at all  Social Connections: Socially Integrated (01/08/2023)   Social Connection and Isolation Panel [NHANES]    Frequency of Communication with Friends and Family: Three times a week    Frequency of Social Gatherings with Friends and Family: Once a week    Attends Religious Services: More than 4 times per year    Active Member of [employer] or Organizations: Yes    Attends Banker Meetings: 1 to 4 times per year    Marital Status: Married  Catering manager Violence: Not on file    Physical Exam: Vital signs in last 24 hours: @BP  (!) 141/69   Pulse (!) 53   Temp (!) 97.3 F (36.3 C) (Skin)   Resp 17   Ht 5' 3.5" (1.613 m)   Wt 222 lb (100.7 kg)   SpO2 100%   BMI 38.71 kg/m  GEN: NAD EYE: Sclerae anicteric ENT: MMM CV: Non-tachycardic Pulm: CTA b/l GI: Soft, NT/ND NEURO:  Alert & Oriented x 3   Erick Blinks, MD [facility]  05/31/2023 8:21 AM

## 2023-06-01 ENCOUNTER — Telehealth: Payer: Self-pay

## 2023-06-01 NOTE — Telephone Encounter (Signed)
  Follow up Call-     05/31/2023    7:51 AM  Call back number  Post procedure Call Back phone  # [phone]  Permission to leave phone message Yes     Patient questions:  Do you have a fever, pain , or abdominal swelling? No. Pain Score  0 *  Have you tolerated food without any problems? Yes.    Have you been able to return to your normal activities? Yes.    Do you have any questions about your discharge instructions: Diet   No. Medications  No. Follow up visit  No.  Do you have questions or concerns about your Care? No.  Actions: * If pain score is 4 or above: No action needed, pain <4.

## 2023-06-03 ENCOUNTER — Other Ambulatory Visit: Payer: Self-pay | Admitting: Nurse Practitioner

## 2023-06-03 DIAGNOSIS — E119 Type 2 diabetes mellitus without complications: Secondary | ICD-10-CM

## 2023-06-04 ENCOUNTER — Encounter: Payer: Self-pay | Admitting: Nurse Practitioner

## 2023-06-04 NOTE — Telephone Encounter (Signed)
Duplicate message. See refill dated 06/03/23

## 2023-06-05 MED ORDER — SEMAGLUTIDE(0.25 OR 0.5MG/DOS) 2 MG/3ML ~~LOC~~ SOPN
0.5000 mg | PEN_INJECTOR | SUBCUTANEOUS | 0 refills | Status: DC
Start: 2023-06-05 — End: 2023-09-10

## 2023-06-08 ENCOUNTER — Other Ambulatory Visit (HOSPITAL_COMMUNITY): Payer: Self-pay

## 2023-06-15 ENCOUNTER — Other Ambulatory Visit (HOSPITAL_COMMUNITY): Payer: Self-pay

## 2023-06-15 ENCOUNTER — Telehealth: Payer: Self-pay | Admitting: Nurse Practitioner

## 2023-06-15 NOTE — Telephone Encounter (Signed)
Patient needs a PA for medication  Semaglutide,0.25 or 0.5MG /DOS, 2 MG/3ML SOPN from her insurance

## 2023-06-18 NOTE — Telephone Encounter (Signed)
My chart sent to patient to let know as well.

## 2023-06-18 NOTE — Telephone Encounter (Signed)
Prior Auth for patients medication Ozempic (0.25 or 0.5 MG/DOSE) 2MG /3ML pen-injectors approved by [employer] from 05/19/23 to 06/17/24.  CoverMyMeds Key: [id number]

## 2023-07-14 ENCOUNTER — Other Ambulatory Visit: Payer: Self-pay | Admitting: Nurse Practitioner

## 2023-07-14 DIAGNOSIS — E119 Type 2 diabetes mellitus without complications: Secondary | ICD-10-CM

## 2023-07-26 ENCOUNTER — Other Ambulatory Visit: Payer: Self-pay | Admitting: Nurse Practitioner

## 2023-08-29 ENCOUNTER — Telehealth: Payer: Self-pay

## 2023-08-29 NOTE — Telephone Encounter (Signed)
Contacted pt to let her know to continue freestyle until its out and then we will switch to dexcom. Pt understood and verbalized her understanding. She has no questions or concerns at this time.

## 2023-08-29 NOTE — Telephone Encounter (Signed)
Received following fax from [employer]. Looks like patient is getting filled at [facility]

## 2023-08-29 NOTE — Telephone Encounter (Signed)
We can let the patient know that she can use her freestyle until she runs out of supplies then we will have to switch to dexcom

## 2023-09-10 ENCOUNTER — Ambulatory Visit: Payer: 59 | Admitting: Nurse Practitioner

## 2023-09-10 ENCOUNTER — Encounter: Payer: Self-pay | Admitting: Nurse Practitioner

## 2023-09-10 VITALS — BP 136/72 | HR 67 | Temp 98.5°F | Ht 63.5 in | Wt 216.0 lb

## 2023-09-10 DIAGNOSIS — Z7984 Long term (current) use of oral hypoglycemic drugs: Secondary | ICD-10-CM

## 2023-09-10 DIAGNOSIS — I10 Essential (primary) hypertension: Secondary | ICD-10-CM | POA: Diagnosis not present

## 2023-09-10 DIAGNOSIS — E119 Type 2 diabetes mellitus without complications: Secondary | ICD-10-CM

## 2023-09-10 DIAGNOSIS — Z7985 Long-term (current) use of injectable non-insulin antidiabetic drugs: Secondary | ICD-10-CM

## 2023-09-10 LAB — POCT GLYCOSYLATED HEMOGLOBIN (HGB A1C): Hemoglobin A1C: 7 % — AB (ref 4.0–5.6)

## 2023-09-10 MED ORDER — SEMAGLUTIDE (1 MG/DOSE) 4 MG/3ML ~~LOC~~ SOPN: 1.0000 mg | PEN_INJECTOR | SUBCUTANEOUS | 1 refills | Status: DC

## 2023-09-10 NOTE — Assessment & Plan Note (Signed)
Currently maintained on losartan 50 mg daily.  Blood pressure control.  Continue medication as prescribed

## 2023-09-10 NOTE — Assessment & Plan Note (Addendum)
Patient currently maintained on metformin 50 mg daily, Ozempic 0.5 mg weekly, Jardiance 10 mg daily.  Patient is checking her glucose at home.  A1c at goal of 7 but would like lower if patient can tolerate it.  Will increase Ozempic to 1 mg once weekly.  Patient states she thinks she tolerated that dosage just not the 2 mg dosage.

## 2023-09-10 NOTE — Patient Instructions (Signed)
Nice to see you today I have sent in the updated ozempic to the pharmacy Follow up with me in 4 months, sooner if you need me

## 2023-09-10 NOTE — Progress Notes (Signed)
Established Patient Office Visit  Subjective   Patient ID: April Cooper, female    DOB: [birth year]  Age: 57 y.o. MRN: [id number]  Chief Complaint  Patient presents with   Medical Management of Chronic Issues    DM f/u    HPI  DM2: patient is currently maintained on Jardiance 10 mg, metformin 1500 mg, Ozempic 0.5 mg.  She does have a CGM.  Per history patient does not tolerate any higher doses of Ozempic. States that she has been having more stress as of late. States that she is traveling more for her employment. States that sometimes she will do helahtier options when she eating out      Review of Systems  Constitutional:  Negative for chills and fever.  Respiratory:  Negative for shortness of breath.   Cardiovascular:  Negative for chest pain.  Gastrointestinal:  Negative for abdominal pain, constipation, diarrhea, nausea and vomiting.  Neurological:  Negative for headaches.      Objective:     BP 136/72   Pulse 67   Temp 98.5 F (36.9 C) (Oral)   Ht 5' 3.5" (1.613 m)   Wt 216 lb (98 kg)   SpO2 97%   BMI 37.66 kg/m  BP Readings from Last 3 Encounters:  09/10/23 136/72  05/31/23 130/68  05/10/23 132/74   Wt Readings from Last 3 Encounters:  09/10/23 216 lb (98 kg)  05/31/23 222 lb (100.7 kg)  05/17/23 222 lb (100.7 kg)   SpO2 Readings from Last 3 Encounters:  09/10/23 97%  05/31/23 100%  05/10/23 98%      Physical Exam Vitals and nursing note reviewed.  Constitutional:      Appearance: Normal appearance.  Cardiovascular:     Rate and Rhythm: Normal rate and regular rhythm.     Heart sounds: Normal heart sounds.  Pulmonary:     Effort: Pulmonary effort is normal.     Breath sounds: Normal breath sounds.  Neurological:     Mental Status: She is alert.      Results for orders placed or performed in visit on 09/10/23  POCT glycosylated hemoglobin (Hb A1C)  Result Value Ref Range   Hemoglobin A1C 7.0 (A) 4.0 - 5.6 %   HbA1c POC (<>  result, manual entry)     HbA1c, POC (prediabetic range)     HbA1c, POC (controlled diabetic range)        The 10-year ASCVD risk score (Arnett DK, et al., 2019) is: 13%    Assessment & Plan:   Problem List Items Addressed This Visit       Cardiovascular and Mediastinum   Essential hypertension    Currently maintained on losartan 50 mg daily.  Blood pressure control.  Continue medication as prescribed        Endocrine   Controlled type 2 diabetes mellitus without complication, without long-term current use of insulin (HCC) - Primary    Patient currently maintained on metformin 50 mg daily, Ozempic 0.5 mg weekly, Jardiance 10 mg daily.  Patient is checking her glucose at home.  A1c at goal of 7 but would like lower if patient can tolerate it.  Will increase Ozempic to 1 mg once weekly.  Patient states she thinks she tolerated that dosage just not the 2 mg dosage.      Relevant Medications   Semaglutide, 1 MG/DOSE, 4 MG/3ML SOPN   Other Relevant Orders   POCT glycosylated hemoglobin (Hb A1C) (Completed)    Return in about  4 months (around 01/09/2024) for DM recheck.    Audria Nine, NP

## 2023-09-26 NOTE — Telephone Encounter (Signed)
Care team updated and records already abstracted to [facility].

## 2023-10-12 ENCOUNTER — Other Ambulatory Visit: Payer: Self-pay | Admitting: Nurse Practitioner

## 2023-10-12 DIAGNOSIS — E119 Type 2 diabetes mellitus without complications: Secondary | ICD-10-CM

## 2023-11-12 LAB — HM DIABETES EYE EXAM

## 2024-01-09 ENCOUNTER — Ambulatory Visit: Payer: 59 | Admitting: Nurse Practitioner

## 2024-01-13 ENCOUNTER — Other Ambulatory Visit: Payer: Self-pay | Admitting: Nurse Practitioner

## 2024-01-13 DIAGNOSIS — I10 Essential (primary) hypertension: Secondary | ICD-10-CM

## 2024-01-18 ENCOUNTER — Ambulatory Visit (INDEPENDENT_AMBULATORY_CARE_PROVIDER_SITE_OTHER): Admitting: Nurse Practitioner

## 2024-01-18 VITALS — BP 110/64 | HR 64 | Temp 98.6°F | Ht 63.5 in | Wt 212.0 lb

## 2024-01-18 DIAGNOSIS — Z7985 Long-term (current) use of injectable non-insulin antidiabetic drugs: Secondary | ICD-10-CM

## 2024-01-18 DIAGNOSIS — E119 Type 2 diabetes mellitus without complications: Secondary | ICD-10-CM

## 2024-01-18 DIAGNOSIS — Z7984 Long term (current) use of oral hypoglycemic drugs: Secondary | ICD-10-CM

## 2024-01-18 LAB — POCT GLYCOSYLATED HEMOGLOBIN (HGB A1C): Hemoglobin A1C: 6.3 % — AB (ref 4.0–5.6)

## 2024-01-18 MED ORDER — METFORMIN HCL ER 500 MG PO TB24: ORAL_TABLET | ORAL | 1 refills | Status: DC

## 2024-01-18 NOTE — Progress Notes (Signed)
   Established Patient Office Visit  Subjective   Patient ID: April Cooper, female    DOB: 1966/06/21  Age: 58 y.o. MRN: [id number]  Chief Complaint  Patient presents with   Diabetes      DM2: patient was seen by me on 09/10/2023, she is currently maintained on jardiance 10, metformin 1500mg  daily and ozempic 1mg  weekly. She is here for a follow up. Her last A1C was 7.0. she is checking her sugars very infrequent. States that she has the feeling of low sugar. States that she has had it once in the past week. States that she is tying it into if she does not eat enough.    Review of Systems  Constitutional:  Negative for chills and fever.  Respiratory:  Negative for shortness of breath.   Cardiovascular:  Negative for chest pain.  Gastrointestinal:  Negative for abdominal pain, constipation, diarrhea, nausea and vomiting.  Neurological:  Negative for headaches.  Psychiatric/Behavioral:  Negative for hallucinations and suicidal ideas.       Objective:     BP 110/64   Pulse 64   Temp 98.6 F (37 C) (Oral)   Ht 5' 3.5" (1.613 m)   Wt 212 lb (96.2 kg)   SpO2 96%   BMI 36.97 kg/m  BP Readings from Last 3 Encounters:  01/18/24 110/64  09/10/23 136/72  05/31/23 130/68   Wt Readings from Last 3 Encounters:  01/18/24 212 lb (96.2 kg)  09/10/23 216 lb (98 kg)  05/31/23 222 lb (100.7 kg)   SpO2 Readings from Last 3 Encounters:  01/18/24 96%  09/10/23 97%  05/31/23 100%      Physical Exam Vitals and nursing note reviewed.  Constitutional:      Appearance: Normal appearance.  Cardiovascular:     Rate and Rhythm: Normal rate and regular rhythm.     Heart sounds: Normal heart sounds.  Pulmonary:     Effort: Pulmonary effort is normal.     Breath sounds: Normal breath sounds.  Abdominal:     General: Bowel sounds are normal.  Neurological:     Mental Status: She is alert.      Results for orders placed or performed in visit on 01/18/24  POCT glycosylated  hemoglobin (Hb A1C)  Result Value Ref Range   Hemoglobin A1C 6.3 (A) 4.0 - 5.6 %   HbA1c POC (<> result, manual entry)     HbA1c, POC (prediabetic range)     HbA1c, POC (controlled diabetic range)        The 10-year ASCVD risk score (Arnett DK, et al., 2019) is: 7%    Assessment & Plan:   Problem List Items Addressed This Visit       Endocrine   Controlled type 2 diabetes mellitus without complication, without long-term current use of insulin (HCC) - Primary   Patient currently maintained on metformin 1500 mg daily, Ozempic 1 mg once weekly, Jardiance 10 mg daily.  She is checking her sugar infrequently at home.  Patient's A1c well-controlled at 6.3%.  Continue working on lifestyle modifications and taking antidiabetic medication as prescribed.      Relevant Medications   metFORMIN (GLUCOPHAGE-XR) 500 MG 24 hr tablet   Other Relevant Orders   POCT glycosylated hemoglobin (Hb A1C) (Completed)    Return in about 4 months (around 05/19/2024) for CPE and Labs.    Audria Nine, NP

## 2024-01-18 NOTE — Assessment & Plan Note (Signed)
 Patient currently maintained on metformin 1500 mg daily, Ozempic 1 mg once weekly, Jardiance 10 mg daily.  She is checking her sugar infrequently at home.  Patient's A1c well-controlled at 6.3%.  Continue working on lifestyle modifications and taking antidiabetic medication as prescribed.

## 2024-01-18 NOTE — Progress Notes (Signed)
   Established Patient Office Visit  Subjective   Patient ID: April Cooper, female    DOB: [birth year]  Age: 58 y.o. MRN: [id number]  Chief Complaint  Patient presents with   Diabetes    HPI  DM2: patient was seen by me on 09/10/2023, she is currently maintained on jardiance 10, metformin 1500mg  daily and ozempic 1mg  weekly. She is here for a follow up. Her last A1C was 7.0. she is checking her sugars very infrequent. States that she has the feeling of low sugar. States that she has had it once in the past week. States that she is tying it into if she does not eat enough.    Review of Systems  Constitutional:  Negative for chills and fever.  Respiratory:  Negative for shortness of breath.   Cardiovascular:  Negative for chest pain.  Gastrointestinal:  Negative for abdominal pain, constipation, diarrhea, nausea and vomiting.  Neurological:  Negative for headaches.  Psychiatric/Behavioral:  Negative for hallucinations and suicidal ideas.       Objective:     BP 110/64   Pulse 64   Temp 98.6 F (37 C) (Oral)   Ht 5' 3.5" (1.613 m)   Wt 212 lb (96.2 kg)   SpO2 96%   BMI 36.97 kg/m  BP Readings from Last 3 Encounters:  01/18/24 110/64  09/10/23 136/72  05/31/23 130/68   Wt Readings from Last 3 Encounters:  01/18/24 212 lb (96.2 kg)  09/10/23 216 lb (98 kg)  05/31/23 222 lb (100.7 kg)   SpO2 Readings from Last 3 Encounters:  01/18/24 96%  09/10/23 97%  05/31/23 100%      Physical Exam Vitals and nursing note reviewed.  Constitutional:      Appearance: Normal appearance.  Cardiovascular:     Rate and Rhythm: Normal rate and regular rhythm.     Heart sounds: Normal heart sounds.  Pulmonary:     Effort: Pulmonary effort is normal.     Breath sounds: Normal breath sounds.  Abdominal:     General: Bowel sounds are normal.  Neurological:     Mental Status: She is alert.      Results for orders placed or performed in visit on 01/18/24  POCT  glycosylated hemoglobin (Hb A1C)  Result Value Ref Range   Hemoglobin A1C 6.3 (A) 4.0 - 5.6 %   HbA1c POC (<> result, manual entry)     HbA1c, POC (prediabetic range)     HbA1c, POC (controlled diabetic range)        The 10-year ASCVD risk score (Arnett DK, et al., 2019) is: 7%    Assessment & Plan:   Problem List Items Addressed This Visit       Endocrine   Controlled type 2 diabetes mellitus without complication, without long-term current use of insulin (HCC) - Primary   Patient currently maintained on metformin 1500 mg daily, Ozempic 1 mg once weekly, Jardiance 10 mg daily.  She is checking her sugar infrequently at home.  Patient's A1c well-controlled at 6.3%.  Continue working on lifestyle modifications and taking antidiabetic medication as prescribed.      Relevant Medications   metFORMIN (GLUCOPHAGE-XR) 500 MG 24 hr tablet   Other Relevant Orders   POCT glycosylated hemoglobin (Hb A1C) (Completed)    Return in about 4 months (around 05/19/2024) for CPE and Labs.    Audria Nine, NP

## 2024-01-18 NOTE — Patient Instructions (Signed)
 Nice to see you today A1C was 6.3% today which is fantastic Follow up with me in 4 months for you physical and full panel of labs, sooner If you need

## 2024-02-02 ENCOUNTER — Other Ambulatory Visit: Payer: Self-pay | Admitting: Nurse Practitioner

## 2024-02-02 DIAGNOSIS — E119 Type 2 diabetes mellitus without complications: Secondary | ICD-10-CM

## 2024-02-21 ENCOUNTER — Other Ambulatory Visit: Payer: Self-pay | Admitting: Nurse Practitioner

## 2024-02-21 DIAGNOSIS — E119 Type 2 diabetes mellitus without complications: Secondary | ICD-10-CM

## 2024-05-21 ENCOUNTER — Ambulatory Visit: Admitting: Nurse Practitioner

## 2024-05-21 ENCOUNTER — Encounter: Payer: Self-pay | Admitting: Nurse Practitioner

## 2024-05-21 VITALS — BP 126/78 | HR 64 | Temp 97.9°F | Ht 63.25 in | Wt 211.4 lb

## 2024-05-21 DIAGNOSIS — E119 Type 2 diabetes mellitus without complications: Secondary | ICD-10-CM | POA: Diagnosis not present

## 2024-05-21 DIAGNOSIS — E785 Hyperlipidemia, unspecified: Secondary | ICD-10-CM

## 2024-05-21 DIAGNOSIS — I10 Essential (primary) hypertension: Secondary | ICD-10-CM

## 2024-05-21 DIAGNOSIS — E1169 Type 2 diabetes mellitus with other specified complication: Secondary | ICD-10-CM

## 2024-05-21 DIAGNOSIS — E559 Vitamin D deficiency, unspecified: Secondary | ICD-10-CM | POA: Diagnosis not present

## 2024-05-21 DIAGNOSIS — Z0000 Encounter for general adult medical examination without abnormal findings: Secondary | ICD-10-CM

## 2024-05-21 DIAGNOSIS — Z7984 Long term (current) use of oral hypoglycemic drugs: Secondary | ICD-10-CM

## 2024-05-21 DIAGNOSIS — Z23 Encounter for immunization: Secondary | ICD-10-CM | POA: Diagnosis not present

## 2024-05-21 LAB — COMPREHENSIVE METABOLIC PANEL WITH GFR
ALT: 16 U/L (ref 0–35)
AST: 12 U/L (ref 0–37)
Albumin: 4 g/dL (ref 3.5–5.2)
Alkaline Phosphatase: 62 U/L (ref 39–117)
BUN: 11 mg/dL (ref 6–23)
CO2: 29 meq/L (ref 19–32)
Calcium: 9.1 mg/dL (ref 8.4–10.5)
Chloride: 105 meq/L (ref 96–112)
Creatinine, Ser: 0.74 mg/dL (ref 0.40–1.20)
GFR: 88.31 mL/min (ref >60.00)
Glucose, Bld: 104 mg/dL — ABNORMAL HIGH (ref 70–99)
Potassium: 4.3 meq/L (ref 3.5–5.1)
Sodium: 141 meq/L (ref 135–145)
Total Bilirubin: 0.4 mg/dL (ref 0.2–1.2)
Total Protein: 6.7 g/dL (ref 6.0–8.3)

## 2024-05-21 LAB — CBC WITH DIFFERENTIAL/PLATELET
Basophils Absolute: 0 K/uL (ref 0.0–0.1)
Basophils Relative: 0.3 % (ref 0.0–3.0)
Eosinophils Absolute: 0.1 K/uL (ref 0.0–0.7)
Eosinophils Relative: 2 % (ref 0.0–5.0)
HCT: 43 % (ref 36.0–46.0)
Hemoglobin: 14.1 g/dL (ref 12.0–15.0)
Lymphocytes Relative: 31.9 % (ref 12.0–46.0)
Lymphs Abs: 2 K/uL (ref 0.7–4.0)
MCHC: 32.8 g/dL (ref 30.0–36.0)
MCV: 86.6 fl (ref 78.0–100.0)
Monocytes Absolute: 0.6 K/uL (ref 0.1–1.0)
Monocytes Relative: 8.8 % (ref 3.0–12.0)
Neutro Abs: 3.6 K/uL (ref 1.4–7.7)
Neutrophils Relative %: 57 % (ref 43.0–77.0)
Platelets: 336 K/uL (ref 150.0–400.0)
RBC: 4.97 Mil/uL (ref 3.87–5.11)
RDW: 14.4 % (ref 11.5–15.5)
WBC: 6.3 K/uL (ref 4.0–10.5)

## 2024-05-21 LAB — MICROALBUMIN / CREATININE URINE RATIO
Creatinine,U: 141.9 mg/dL
Microalb Creat Ratio: 5.7 mg/g (ref 0.0–30.0)
Microalb, Ur: 0.8 mg/dL (ref 0.0–1.9)

## 2024-05-21 LAB — LIPID PANEL
Cholesterol: 144 mg/dL (ref 0–200)
HDL: 57.7 mg/dL (ref >39.00)
LDL Cholesterol: 68 mg/dL (ref 0–99)
NonHDL: 86.62
Total CHOL/HDL Ratio: 3
Triglycerides: 91 mg/dL (ref 0.0–149.0)
VLDL: 18.2 mg/dL (ref 0.0–40.0)

## 2024-05-21 LAB — TSH: TSH: 3.2 u[IU]/mL (ref 0.35–5.50)

## 2024-05-21 LAB — POCT GLYCOSYLATED HEMOGLOBIN (HGB A1C): Hemoglobin A1C: 6.3 % — AB (ref 4.0–5.6)

## 2024-05-21 LAB — VITAMIN D 25 HYDROXY (VIT D DEFICIENCY, FRACTURES): VITD: 24.28 ng/mL — ABNORMAL LOW (ref 30.00–100.00)

## 2024-05-21 NOTE — Patient Instructions (Signed)
 Nice to see you today We did update your pneumonia vaccine today I want to see you in 6 months, sooner if you need me

## 2024-05-21 NOTE — Assessment & Plan Note (Signed)
 Patient currently maintained on losartan  50 mg daily.  Blood pressure well-controlled.  Continue medication

## 2024-05-21 NOTE — Assessment & Plan Note (Signed)
 Discussed age-appropriate immunizations and screening exams.  We reviewed patient's personal, surgical, social, family histories.  Patient is up-to-date on all age-appropriate vaccinations she would like.  Update Prevnar 20 today.  Patient up-to-date on CRC screening, cervical cancer screening, breast cancer screening.  Patient was given information at discharge about preventative healthcare maintenance with anticipatory guidance

## 2024-05-21 NOTE — Assessment & Plan Note (Signed)
 Patient currently maintained on 5 mg daily.  Pending lipid panel

## 2024-05-21 NOTE — Progress Notes (Signed)
 Established Patient Office Visit  Subjective   Patient ID: April Cooper, female    DOB: 03/30/1966  Age: 58 y.o. MRN: [id number]  Chief Complaint  Patient presents with   Annual Exam    Would like prevnar 20     HPI  DM2: Patient currently maintained on metformin  1000 mg twice daily, Jardiance  10 mg daily, Ozempic  1 mg weekly. States that she is checking sugars approx once a week 108 in the morning.  No hypoglycemia per her report   HTN: Patient currently maintained on losartan  50 mg daily  for complete physical and follow up of chronic conditions.  Immunizations: -Tetanus: Completed in 2021 -Influenza: Out of season -Shingles: Completed Shingrix series -Pneumonia: Needs Prevnar 20, update today  Diet: Fair diet. She is eating 6 small meals a day. She is doing small portions. She is drining coffee in the morning and water. She will have soft drink  Exercise:  Walking and up and down the steps   Eye exam: Completes annually last week xaltan and she is good for year  Dental exam: Completes semi-annually    Colonoscopy: Completed in 05/31/2023, repeat 10 years Lung Cancer Screening: NA  Pap smear: 2021, Dr. Jon rummer due this year   Mammogram: 2024 through [facility]  DEXA: too young  Sleep: going to bed around 930 and then will get up around 6. Feels rested. Does toss and turn. Does snore intermittently       Review of Systems  Constitutional:  Negative for chills and fever.  Respiratory:  Negative for shortness of breath.   Cardiovascular:  Negative for chest pain and leg swelling.  Gastrointestinal:  Negative for abdominal pain, blood in stool, constipation, diarrhea, nausea and vomiting.       BM daily   Genitourinary:  Negative for dysuria and hematuria.  Neurological:  Negative for dizziness, tingling and headaches.  Psychiatric/Behavioral:  Negative for hallucinations and suicidal ideas.       Objective:     BP 126/78   Pulse 64   Temp 97.9 F  (36.6 C) (Oral)   Ht 5' 3.25 (1.607 m)   Wt 211 lb 6.4 oz (95.9 kg)   SpO2 95%   BMI 37.15 kg/m  BP Readings from Last 3 Encounters:  05/21/24 126/78  01/18/24 110/64  09/10/23 136/72   Wt Readings from Last 3 Encounters:  05/21/24 211 lb 6.4 oz (95.9 kg)  01/18/24 212 lb (96.2 kg)  09/10/23 216 lb (98 kg)   SpO2 Readings from Last 3 Encounters:  05/21/24 95%  01/18/24 96%  09/10/23 97%      Physical Exam Vitals and nursing note reviewed.  Constitutional:      Appearance: Normal appearance.  HENT:     Right Ear: Tympanic membrane, ear canal and external ear normal.     Left Ear: Tympanic membrane, ear canal and external ear normal.     Mouth/Throat:     Mouth: Mucous membranes are moist.     Pharynx: Oropharynx is clear.  Eyes:     Extraocular Movements: Extraocular movements intact.     Pupils: Pupils are equal, round, and reactive to light.  Cardiovascular:     Rate and Rhythm: Normal rate and regular rhythm.     Pulses: Normal pulses.     Heart sounds: Normal heart sounds.  Pulmonary:     Effort: Pulmonary effort is normal.     Breath sounds: Normal breath sounds.  Abdominal:     General:  Bowel sounds are normal. There is no distension.     Palpations: There is no mass.     Tenderness: There is no abdominal tenderness.     Hernia: No hernia is present.  Musculoskeletal:     Right lower leg: No edema.     Left lower leg: No edema.  Lymphadenopathy:     Cervical: No cervical adenopathy.  Skin:    General: Skin is warm.  Neurological:     General: No focal deficit present.     Mental Status: She is alert.     Deep Tendon Reflexes:     Reflex Scores:      Bicep reflexes are 2+ on the right side and 2+ on the left side.      Patellar reflexes are 2+ on the right side and 2+ on the left side.    Comments: Bilateral upper and lower extremity strength 5/5  Psychiatric:        Mood and Affect: Mood normal.        Behavior: Behavior normal.        Thought  Content: Thought content normal.        Judgment: Judgment normal.     Title   Diabetic Foot Exam - detailed Is there a history of foot ulcer?: No Is there a foot ulcer now?: No Is there swelling?: No Is there elevated skin temperature?: No Is there abnormal foot shape?: No Is there a claw toe deformity?: No Are the toenails long?: No Are the toenails thick?: No Are the toenails ingrown?: No Right Posterior Tibialis: Present Left posterior Tibialis: Present   Right Dorsalis Pedis: Present Left Dorsalis Pedis: Present     Semmes-Weinstein Monofilament Test + means has sensation and - means no sensation      Image components are not supported.   Image components are not supported. Image components are not supported.  Tuning Fork Comments All 10 sites tested sensation intact bilaterally     Results for orders placed or performed in visit on 05/21/24  POCT glycosylated hemoglobin (Hb A1C)  Result Value Ref Range   Hemoglobin A1C 6.3 (A) 4.0 - 5.6 %   HbA1c POC (<> result, manual entry)     HbA1c, POC (prediabetic range)     HbA1c, POC (controlled diabetic range)        The 10-year ASCVD risk score (Arnett DK, et al., 2019) is: 10.4%    Assessment & Plan:   Problem List Items Addressed This Visit       Cardiovascular and Mediastinum   Essential hypertension   Patient currently maintained on losartan  50 mg daily.  Blood pressure well-controlled.  Continue medication      Relevant Orders   Comprehensive metabolic panel with GFR   CBC with Differential/Platelet   TSH   Lipid panel     Endocrine   Hyperlipidemia associated with type 2 diabetes mellitus (HCC)   Patient currently maintained on 5 mg daily.  Pending lipid panel      Relevant Orders   Lipid panel   Controlled type 2 diabetes mellitus without complication, without long-term current use of insulin  (HCC)   Patient currently maintained on metformin  1000 mg twice daily, Jardiance  10 mg  daily, Ozempic  1 mg weekly.  A1c well-controlled continue medication as prescribed      Relevant Orders   POCT glycosylated hemoglobin (Hb A1C) (Completed)   Comprehensive metabolic panel with GFR   CBC with Differential/Platelet   Microalbumin / creatinine urine  ratio   Lipid panel     Other   Vitamin D  deficiency   History of same pending vitamin D  level today      Relevant Orders   VITAMIN D  25 Hydroxy (Vit-D Deficiency, Fractures)   Preventative health care - Primary   Discussed age-appropriate immunizations and screening exams.  We reviewed patient's personal, surgical, social, family histories.  Patient is up-to-date on all age-appropriate vaccinations she would like.  Update Prevnar 20 today.  Patient up-to-date on CRC screening, cervical cancer screening, breast cancer screening.  Patient was given information at discharge about preventative healthcare maintenance with anticipatory guidance      Relevant Orders   Comprehensive metabolic panel with GFR   CBC with Differential/Platelet   TSH   Other Visit Diagnoses       Need for pneumococcal 20-valent conjugate vaccination       Relevant Orders   Pneumococcal conjugate vaccine 20-valent (Prevnar 20) (Completed)       Return in about 6 months (around 11/21/2024) for DM recheck.    Adina Crandall, NP

## 2024-05-21 NOTE — Assessment & Plan Note (Signed)
 History of same pending vitamin D  level today.

## 2024-05-21 NOTE — Assessment & Plan Note (Signed)
 Patient currently maintained on metformin  1000 mg twice daily, Jardiance  10 mg daily, Ozempic  1 mg weekly.  A1c well-controlled continue medication as prescribed

## 2024-05-23 ENCOUNTER — Ambulatory Visit: Payer: Self-pay | Admitting: Nurse Practitioner

## 2024-07-04 ENCOUNTER — Other Ambulatory Visit: Payer: Self-pay | Admitting: Nurse Practitioner

## 2024-07-04 DIAGNOSIS — I10 Essential (primary) hypertension: Secondary | ICD-10-CM

## 2024-07-24 ENCOUNTER — Other Ambulatory Visit: Payer: Self-pay | Admitting: Nurse Practitioner

## 2024-07-24 DIAGNOSIS — E119 Type 2 diabetes mellitus without complications: Secondary | ICD-10-CM

## 2024-08-10 ENCOUNTER — Other Ambulatory Visit: Payer: Self-pay | Admitting: Nurse Practitioner

## 2024-08-10 DIAGNOSIS — E119 Type 2 diabetes mellitus without complications: Secondary | ICD-10-CM

## 2024-11-21 ENCOUNTER — Ambulatory Visit: Admitting: Nurse Practitioner
